# Patient Record
Sex: Male | Born: 1935 | Race: White | Hispanic: No
Health system: Midwestern US, Academic
[De-identification: ages and names within clinical notes are randomized; demographics above are authoritative.]

## PROBLEM LIST (undated history)

## (undated) MED ADMIN — SODIUM CHLORIDE 0.9% IRRIGATION BAG [210330]: 3000.000 mL | NDC 00338004747

---

## 2014-03-29 DEATH — deceased

## 2016-07-09 ENCOUNTER — Encounter: Admit: 2016-07-09 | Discharge: 2016-07-09 | Payer: MEDICARE | Primary: Cardiovascular Disease

## 2016-07-09 DIAGNOSIS — I639 Cerebral infarction, unspecified: Principal | ICD-10-CM

## 2016-07-09 DIAGNOSIS — G459 Transient cerebral ischemic attack, unspecified: ICD-10-CM

## 2016-07-09 DIAGNOSIS — H5340 Unspecified visual field defects: ICD-10-CM

## 2016-07-09 NOTE — Telephone Encounter
Logan Bright from [facility] with Dr. Freada Bergeron office called to update [name] on patient's annual eye exam done today.  Logan Bright reports that last year patient had an eye exam which showed a possible stroke.  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  This year the defect is still present, but has slightly improved.  Patient did report last years findings to [name].  Will route to [facility] for review.

## 2016-07-16 NOTE — Telephone Encounter
Received message back from [facility]. SDO recommended carotid ultrasound if pt has not had one recently.  Patient has not had recent carotid ultrasound. Called patient and discussed SDO recommendations.  [name] order faxed to [facility] scheduling and scheduling will call pt to schedule. Pt notified with plan and he verbalizes understanding.

## 2016-08-08 NOTE — Telephone Encounter
Received results and placed in Dr. Ricard Dillon' folder for review.    08/06/16 Carotid Ultrasound [facility]): Bilateral antegrade vertebral artery flow. No hemodynamically significant common or internal carotid artery stenosis.

## 2016-08-09 ENCOUNTER — Encounter: Admit: 2016-08-09 | Discharge: 2016-08-09 | Payer: MEDICARE | Primary: Cardiovascular Disease

## 2016-08-09 NOTE — Telephone Encounter
-----   Message from Rosaria Ferries, LPN sent at 6/59/9357  3:34 PM CDT -----  Regarding: Remote check  This patient is scheduled to follow up with Dr. Tyson Alias in [facility] on 08/22/16. Can we get a current remote check on this patient? Thank you!

## 2016-08-09 NOTE — Progress Notes
Carotid Artery Duplex - 08/06/16 at [facility]    Impression: Bilateral antegrade vertebral artery flow.  No hemodynamically significant common or internal carotid artery stenosis.

## 2016-08-09 NOTE — Telephone Encounter
I walked pt thru sending a remote tonight.  Pt has been leaving transmitter plugged in but not in bedroom. I asked him to do this tomorrow (after today's remote has a chance to go thru).  Does sound like Wire X adapter only had 2 green dots so may have cellular connectivity issues.  Will need to watch.

## 2016-08-10 ENCOUNTER — Ambulatory Visit: Admit: 2016-08-09 | Discharge: 2016-08-10 | Payer: MEDICARE | Primary: Cardiovascular Disease

## 2016-08-10 DIAGNOSIS — Z95810 Presence of automatic (implantable) cardiac defibrillator: ICD-10-CM

## 2016-08-10 DIAGNOSIS — I472 Ventricular tachycardia: Principal | ICD-10-CM

## 2016-08-10 DIAGNOSIS — R55 Syncope and collapse: ICD-10-CM

## 2016-08-22 ENCOUNTER — Encounter: Admit: 2016-08-22 | Discharge: 2016-08-22 | Payer: MEDICARE | Primary: Cardiovascular Disease

## 2016-08-22 ENCOUNTER — Ambulatory Visit: Admit: 2016-08-22 | Discharge: 2016-08-23 | Payer: MEDICARE | Primary: Cardiovascular Disease

## 2016-08-22 DIAGNOSIS — Z95810 Presence of automatic (implantable) cardiac defibrillator: Principal | ICD-10-CM

## 2016-08-22 DIAGNOSIS — R55 Syncope and collapse: ICD-10-CM

## 2016-08-22 DIAGNOSIS — T148XXA Other injury of unspecified body region, initial encounter: ICD-10-CM

## 2016-08-22 DIAGNOSIS — I10 Essential (primary) hypertension: ICD-10-CM

## 2016-08-22 DIAGNOSIS — E785 Hyperlipidemia, unspecified: ICD-10-CM

## 2016-08-22 DIAGNOSIS — N289 Disorder of kidney and ureter, unspecified: ICD-10-CM

## 2016-08-22 DIAGNOSIS — I255 Ischemic cardiomyopathy: ICD-10-CM

## 2016-08-22 DIAGNOSIS — I472 Ventricular tachycardia: ICD-10-CM

## 2016-08-22 DIAGNOSIS — Z955 Presence of coronary angioplasty implant and graft: ICD-10-CM

## 2016-08-22 DIAGNOSIS — I5022 Chronic systolic (congestive) heart failure: ICD-10-CM

## 2016-08-22 DIAGNOSIS — Z8679 Personal history of other diseases of the circulatory system: ICD-10-CM

## 2016-08-22 DIAGNOSIS — I2510 Atherosclerotic heart disease of native coronary artery without angina pectoris: ICD-10-CM

## 2016-08-22 DIAGNOSIS — M1990 Unspecified osteoarthritis, unspecified site: ICD-10-CM

## 2016-08-22 MED ORDER — POTASSIUM CHLORIDE 10 MEQ PO TBER
10 meq | ORAL_CAPSULE | Freq: Two times a day (BID) | ORAL | 3 refills | 30.00000 days | Status: AC
Start: 2016-08-22 — End: 2017-10-07

## 2016-08-22 MED ORDER — CARVEDILOL 6.25 MG PO TAB
6.25 mg | ORAL_TABLET | Freq: Two times a day (BID) | ORAL | 3 refills | 90.00000 days | Status: AC
Start: 2016-08-22 — End: 2017-07-01

## 2016-08-22 MED ORDER — FUROSEMIDE 40 MG PO TAB
40 mg | ORAL_TABLET | Freq: Every day | ORAL | 3 refills | 90.00000 days | Status: AC
Start: 2016-08-22 — End: 2018-04-09

## 2016-08-22 MED ORDER — ALLOPURINOL 100 MG PO TAB
100 mg | ORAL_TABLET | Freq: Every evening | ORAL | 3 refills | 60.00000 days | Status: AC
Start: 2016-08-22 — End: 2017-10-07

## 2016-08-22 MED ORDER — LOSARTAN 25 MG PO TAB
25 mg | ORAL_TABLET | Freq: Every day | ORAL | 3 refills | 90.00000 days | Status: AC
Start: 2016-08-22 — End: 2017-10-07

## 2016-08-22 NOTE — Assessment & Plan Note
Lab Results   Component Value Date    CHOL 173 03/31/2013    TRIG 130 03/31/2013    HDL 41 03/31/2013    LDL 104 (H) 03/31/2013    VLDL 26 03/31/2013    NONHDLCHOL 132 03/31/2013    CHOLHDLC 4 04/17/2012

## 2016-08-22 NOTE — Assessment & Plan Note
Blood pressure looks fine on the current medical program.

## 2016-08-22 NOTE — Assessment & Plan Note
Normal device function.  No therapies.

## 2016-08-22 NOTE — Assessment & Plan Note
Stable, class II heart failure symptoms currently.

## 2016-08-22 NOTE — Assessment & Plan Note
No recurrence of ventricular tachycardia since the ablation procedure 3 years ago.  He is now off antiarrhythmic drug therapy.

## 2016-08-22 NOTE — Progress Notes
Date of Service: 08/22/2016    Logan Bright is a 81 y.o. male.       HPI     He's had a reunion with an estranged daughter and is looking forward to going to his granddaughter's graduation in [place].    He's living independently in a house in [place].  He isn't driving, but he walks pretty much everywhere.    He isn't having any trouble with chest discomfort, palpitations, or dyspnea.  He isn't using any anti-coagulation, but his recent device check shows very limited AF episodes.         Vitals:    08/22/16 0829   BP: 132/82   Pulse: 61   Weight: 102.5 kg (226 lb)   Height: 1.829 m (6')     Body mass index is 30.65 kg/m???.     Past Medical History  Patient Active Problem List    Diagnosis Date Noted   ??? 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 No hemodynamically significant common or internal carotid artery stenosis.     ??? Closed trimalleolar fracture of right ankle 11/12/2015   ??? Debility 06/10/2013   ??? Recurrent ventricular tachycardia w/ 2 VT ablations 04/2013 05/11/2013     Sustained VT w/ multiple ICD tachypacing and Shocks, failed amiodaron   05/05/13 & 05/12/13  VT ablations [facility] w/ impella support 4/15, Home off Coreg and losartan d/t post procedure hematoma, hypotension     ??? 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 Ermelinda Das.        ??? Ischemic cardiomyopathy      6/02 - infarct 2 stents .  [facility]            4/04 - Surveillance stress test at [facility]: reportedly negative.  4/05 - Stress thallium study: EF 30%, LAD and RCA defects, elevated lung uptake.- Echo doppler: EF 25%, amt& inf hypokinesis,  apical akinesis, poss  thrombus, mild valvular Regurg       5/06 - Stress thallium: EF 22%, no signficant interval change.  08/2011 - Dobut echo.  EF 10-15%. Antero-apical akinesis/dyskinesis.  No ischemia.  04/2013 RegadenThall EF 23% Extensive Anterior infarct, no ischemia, unchanged from prior     ??? Chronic systolic CHF (congestive heart failure) (HCC)       2003 - Status post ICD implant.    4/05 - EF 25-30% per stress thallium and echo doppler.  08/2011 - Echo EF 10-15%     ??? Neurologic cardiac syncope        3/05 - Developed syncope while at restaurant, taken to ED.  Symptoms of diaphoresis                    and lightheadedness.          4/05 - Head up tilt study: positive for inducing hypotension 72/49. Produced symptoms                    c/w previous syncopal episode.     ??? HTN (hypertension)                 2002 - Diagnosis established.     ???  Dyslipidemia         09/26/2008  03/14/2009  02/26/2011  04/17/2012  03/31/2013   LDL 107 (H) 141 (H) 80 108 (H) 104 (H)   04/2013: Refuses Statins understands that they reduce recurrent CV events, still declines.      ??? Gout    ??? CKD (chronic kidney disease)      mild           Review of Systems   Constitution: Negative.   HENT: Negative.    Eyes: Negative.    Cardiovascular: Negative.    Respiratory: Negative.    Endocrine: Negative.    Hematologic/Lymphatic: Negative.    Skin: Negative.    Musculoskeletal: Negative.    Gastrointestinal: Negative.    Genitourinary: Negative.    Neurological: Negative.    Psychiatric/Behavioral: Negative.    Allergic/Immunologic: Negative.        Physical Exam    Physical Exam   General Appearance: no distress   Skin: warm, no ulcers or xanthomas   Digits and Nails: no cyanosis or clubbing   Eyes: conjunctivae and lids normal, pupils are equal and round   Teeth/Gums/Palate: dentition unremarkable, no lesions   Lips & Oral Mucosa: no pallor or cyanosis   Neck Veins: normal JVP , neck veins are not distended Thyroid: no nodules, masses, tenderness or enlargement   Chest Inspection: chest is normal in appearance   Respiratory Effort: breathing comfortably, no respiratory distress   Auscultation/Percussion: lungs clear to auscultation, no rales or rhonchi, no wheezing   PMI: PMI not enlarged or displaced   Cardiac Rhythm: regular rhythm and normal rate   Cardiac Auscultation: S1, S2 normal, no rub, no gallop   Murmurs: no murmur   Peripheral Circulation: normal peripheral circulation   Carotid Arteries: normal carotid upstroke bilaterally, no bruits   Radial Arteries: normal symmetric radial pulses   Abdominal Aorta: no abdominal aortic bruit   Pedal Pulses: normal symmetric pedal pulses   Lower Extremity Edema: no lower extremity edema   Abdominal Exam: soft, non-tender, no masses, bowel sounds normal   Liver & Spleen: no organomegaly   Gait & Station: walks without assistance   Muscle Strength: normal muscle tone   Orientation: oriented to time, place and person   Affect & Mood: appropriate and sustained affect   Language and Memory: patient responsive and seems to comprehend information   Neurologic Exam: neurological assessment grossly intact   Other: moves all extremities        Problems Addressed Today  Encounter Diagnoses   Name Primary?   ??? Automatic implantable cardioverter-defibrillator in situ    ??? Chronic systolic CHF (congestive heart failure) (HCC)    ??? Dyslipidemia    ??? Essential hypertension    ??? Recurrent ventricular tachycardia w/ 2 VT ablations 04/2013        Assessment and Plan       Automatic implantable cardioverter-defibrillator in situ  Normal device function.  No therapies.    Chronic systolic CHF (congestive heart failure)  Stable, class II heart failure symptoms currently.    Dyslipidemia  Lab Results   Component Value Date    CHOL 173 03/31/2013    TRIG 130 03/31/2013    HDL 41 03/31/2013    LDL 104 (H) 03/31/2013    VLDL 26 03/31/2013    NONHDLCHOL 132 03/31/2013    CHOLHDLC 4 04/17/2012 HTN (hypertension)  Blood pressure looks fine on the current medical program.    Recurrent ventricular tachycardia w/  2 VT ablations 04/2013  No recurrence of ventricular tachycardia since the ablation procedure 3 years ago.  He is now off antiarrhythmic drug therapy.      Current Medications (including today's revisions)  ??? allopurinol (ZYLOPRIM) 100 mg tablet Take 1 tablet by mouth at bedtime daily.   ??? aspirin EC 81 mg tablet Take 81 mg by mouth daily. Take with food.   ??? carvedilol (COREG) 6.25 mg tablet Take 1 tablet by mouth twice daily with meals.   ??? furosemide (LASIX) 40 mg tablet Take 1 tablet by mouth daily.   ??? losartan (COZAAR) 25 mg tablet Take 1 tablet by mouth daily.   ??? polyethylene glycol 3350 (MIRALAX) 17 g packet Take 1 packet by mouth daily.   ??? potassium chloride (KLOR-CON 10) 10 mEq tablet Take 1 tablet by mouth twice daily. Take with a meal and a full glass of water.   ??? senna/docusate (SENOKOT-S) 8.6/50 mg tablet Take 1 tablet by mouth twice daily as needed. Take for constipation. Hold for loose stools.

## 2016-08-27 ENCOUNTER — Encounter: Admit: 2016-08-27 | Discharge: 2016-08-27 | Payer: MEDICARE | Primary: Cardiovascular Disease

## 2016-09-04 ENCOUNTER — Encounter: Admit: 2016-09-04 | Discharge: 2016-09-04 | Payer: MEDICARE | Primary: Cardiovascular Disease

## 2016-09-06 NOTE — Telephone Encounter
Last connection that occurred between his [employer] remote transmitter and website occurred on 08/09/16. It appears that [place] may have spotty cellular service, which is the reason the patient does keep loosing cellular service. We need to verify if the patient moved his remote transmitter, as he was previously not keeping at his bedside. Call placed to home number, no answer and unable to leave a message. Will try back later. DB.

## 2016-09-10 ENCOUNTER — Encounter: Admit: 2016-09-10 | Discharge: 2016-09-10 | Payer: MEDICARE | Primary: Cardiovascular Disease

## 2016-09-10 NOTE — Telephone Encounter
Attempted to contact pt to discuss. No answer. VM not set up on phone. Will attempt again later.

## 2016-09-10 NOTE — Telephone Encounter
-----   Message from Ayesha Rumpf, RN sent at 09/06/2016  9:00 AM CDT -----  Regarding: FW: [employer] connected?   Last connection that occurred between his [employer] remote transmitter and website occurred on 08/09/16. It appears that [place] may have spotty cellular service, which is the reason the patient does keep loosing cellular service. We need to verify if the patient moved his remote transmitter, as he was previously not keeping at his bedside. Attempted to contact on 8/10, no answer and unable to leave a voicemail.   ----- Message -----  From: Matilde Bash, RN  Sent: 09/06/2016  To: Cherlyn Labella Remote  Subject: [employer]link connected?                               Should have received remote on 7/13.  Make sure still connected, may have Wire X issues and make sure next remote is scheduled.   ----- Message -----  From: Rosaria Ferries, LPN  Sent: 04/06/4074   3:34 PM  To: Foye Deer, RN, Mac Ep Remote  Subject: Remote check                                     This patient is scheduled to follow up with Dr. Tyson Alias in [facility] on 08/22/16. Can we get a current remote check on this patient? Thank you!

## 2016-09-24 NOTE — Telephone Encounter
Made contact with patient. He was still using the analog line. I ordered pt a new MDT monitor from the carelink website and let the patient know it would be there in about 10 business days. Pt was happy with follow up and told me he would call if he had any questions once it came in the mail. Confirmed mailing address as well.

## 2016-11-08 ENCOUNTER — Ambulatory Visit: Admit: 2016-11-08 | Discharge: 2016-11-09 | Payer: MEDICARE | Primary: Cardiovascular Disease

## 2016-11-08 DIAGNOSIS — Z95810 Presence of automatic (implantable) cardiac defibrillator: Secondary | ICD-10-CM

## 2016-11-09 DIAGNOSIS — R55 Syncope and collapse: ICD-10-CM

## 2016-11-09 DIAGNOSIS — I472 Ventricular tachycardia: ICD-10-CM

## 2016-11-09 DIAGNOSIS — I255 Ischemic cardiomyopathy: Principal | ICD-10-CM

## 2017-02-08 ENCOUNTER — Ambulatory Visit: Admit: 2017-02-07 | Discharge: 2017-02-08 | Payer: MEDICARE | Primary: Cardiovascular Disease

## 2017-02-08 DIAGNOSIS — R55 Syncope and collapse: ICD-10-CM

## 2017-02-08 DIAGNOSIS — Z95810 Presence of automatic (implantable) cardiac defibrillator: Secondary | ICD-10-CM

## 2017-02-08 DIAGNOSIS — I255 Ischemic cardiomyopathy: Principal | ICD-10-CM

## 2017-02-08 DIAGNOSIS — I472 Ventricular tachycardia: ICD-10-CM

## 2017-02-14 ENCOUNTER — Encounter: Admit: 2017-02-14 | Discharge: 2017-02-14 | Payer: MEDICARE | Primary: Cardiovascular Disease

## 2017-02-14 DIAGNOSIS — I255 Ischemic cardiomyopathy: ICD-10-CM

## 2017-02-14 DIAGNOSIS — R55 Syncope and collapse: Principal | ICD-10-CM

## 2017-02-25 ENCOUNTER — Encounter: Admit: 2017-02-25 | Discharge: 2017-02-25 | Payer: MEDICARE | Primary: Cardiovascular Disease

## 2017-02-25 ENCOUNTER — Ambulatory Visit: Admit: 2017-02-25 | Discharge: 2017-02-26 | Payer: MEDICARE | Primary: Cardiovascular Disease

## 2017-02-25 DIAGNOSIS — R55 Syncope and collapse: ICD-10-CM

## 2017-02-25 DIAGNOSIS — T148XXA Other injury of unspecified body region, initial encounter: ICD-10-CM

## 2017-02-25 DIAGNOSIS — E785 Hyperlipidemia, unspecified: ICD-10-CM

## 2017-02-25 DIAGNOSIS — M1990 Unspecified osteoarthritis, unspecified site: ICD-10-CM

## 2017-02-25 DIAGNOSIS — I10 Essential (primary) hypertension: ICD-10-CM

## 2017-02-25 DIAGNOSIS — Z95810 Presence of automatic (implantable) cardiac defibrillator: ICD-10-CM

## 2017-02-25 DIAGNOSIS — Z8679 Personal history of other diseases of the circulatory system: ICD-10-CM

## 2017-02-25 DIAGNOSIS — I2510 Atherosclerotic heart disease of native coronary artery without angina pectoris: ICD-10-CM

## 2017-02-25 DIAGNOSIS — I5022 Chronic systolic (congestive) heart failure: Principal | ICD-10-CM

## 2017-02-25 DIAGNOSIS — N289 Disorder of kidney and ureter, unspecified: ICD-10-CM

## 2017-02-25 DIAGNOSIS — Z955 Presence of coronary angioplasty implant and graft: ICD-10-CM

## 2017-02-25 DIAGNOSIS — I255 Ischemic cardiomyopathy: ICD-10-CM

## 2017-02-27 ENCOUNTER — Encounter: Admit: 2017-02-27 | Discharge: 2017-02-27 | Payer: MEDICARE | Primary: Cardiovascular Disease

## 2017-03-06 ENCOUNTER — Ambulatory Visit: Admit: 2017-03-06 | Discharge: 2017-03-07 | Payer: MEDICARE | Primary: Cardiovascular Disease

## 2017-03-06 DIAGNOSIS — R55 Syncope and collapse: Principal | ICD-10-CM

## 2017-03-06 DIAGNOSIS — I255 Ischemic cardiomyopathy: ICD-10-CM

## 2017-05-01 ENCOUNTER — Encounter: Admit: 2017-05-01 | Discharge: 2017-05-01 | Payer: MEDICARE | Primary: Cardiovascular Disease

## 2017-05-01 DIAGNOSIS — M1990 Unspecified osteoarthritis, unspecified site: ICD-10-CM

## 2017-05-01 DIAGNOSIS — Z95810 Presence of automatic (implantable) cardiac defibrillator: Principal | ICD-10-CM

## 2017-05-01 DIAGNOSIS — I255 Ischemic cardiomyopathy: ICD-10-CM

## 2017-05-01 DIAGNOSIS — E785 Hyperlipidemia, unspecified: ICD-10-CM

## 2017-05-01 DIAGNOSIS — Z955 Presence of coronary angioplasty implant and graft: ICD-10-CM

## 2017-05-01 DIAGNOSIS — I10 Essential (primary) hypertension: ICD-10-CM

## 2017-05-01 DIAGNOSIS — N289 Disorder of kidney and ureter, unspecified: ICD-10-CM

## 2017-05-01 DIAGNOSIS — Z8679 Personal history of other diseases of the circulatory system: ICD-10-CM

## 2017-05-01 DIAGNOSIS — I2510 Atherosclerotic heart disease of native coronary artery without angina pectoris: ICD-10-CM

## 2017-05-01 DIAGNOSIS — T148XXA Other injury of unspecified body region, initial encounter: ICD-10-CM

## 2017-05-01 DIAGNOSIS — R55 Syncope and collapse: ICD-10-CM

## 2017-05-09 ENCOUNTER — Ambulatory Visit: Admit: 2017-05-09 | Discharge: 2017-05-10 | Payer: MEDICARE | Primary: Cardiovascular Disease

## 2017-05-10 DIAGNOSIS — Z95810 Presence of automatic (implantable) cardiac defibrillator: ICD-10-CM

## 2017-05-10 DIAGNOSIS — I255 Ischemic cardiomyopathy: Principal | ICD-10-CM

## 2017-05-10 DIAGNOSIS — I472 Ventricular tachycardia: ICD-10-CM

## 2017-05-10 DIAGNOSIS — R55 Syncope and collapse: ICD-10-CM

## 2017-07-01 ENCOUNTER — Encounter: Admit: 2017-07-01 | Discharge: 2017-07-01 | Payer: MEDICARE | Primary: Cardiovascular Disease

## 2017-07-01 MED ORDER — CARVEDILOL 6.25 MG PO TAB
6.25 mg | ORAL_TABLET | Freq: Two times a day (BID) | ORAL | 3 refills | 90.00000 days | Status: AC
Start: 2017-07-01 — End: 2018-04-07

## 2017-07-11 ENCOUNTER — Encounter: Admit: 2017-07-11 | Discharge: 2017-07-11 | Payer: MEDICARE | Primary: Cardiovascular Disease

## 2017-08-08 ENCOUNTER — Ambulatory Visit: Admit: 2017-08-08 | Discharge: 2017-08-09 | Payer: MEDICARE | Primary: Cardiovascular Disease

## 2017-08-08 DIAGNOSIS — Z95810 Presence of automatic (implantable) cardiac defibrillator: Secondary | ICD-10-CM

## 2017-08-08 DIAGNOSIS — R55 Syncope and collapse: Secondary | ICD-10-CM

## 2017-08-09 DIAGNOSIS — I255 Ischemic cardiomyopathy: Principal | ICD-10-CM

## 2017-08-09 DIAGNOSIS — I472 Ventricular tachycardia: ICD-10-CM

## 2017-08-22 ENCOUNTER — Encounter: Admit: 2017-08-22 | Discharge: 2017-08-22 | Payer: MEDICARE | Primary: Cardiovascular Disease

## 2017-08-28 ENCOUNTER — Encounter: Admit: 2017-08-28 | Discharge: 2017-08-28 | Payer: MEDICARE | Primary: Cardiovascular Disease

## 2017-08-28 ENCOUNTER — Ambulatory Visit: Admit: 2017-08-28 | Discharge: 2017-08-29 | Payer: MEDICARE | Primary: Cardiovascular Disease

## 2017-08-28 DIAGNOSIS — M1990 Unspecified osteoarthritis, unspecified site: ICD-10-CM

## 2017-08-28 DIAGNOSIS — R55 Syncope and collapse: ICD-10-CM

## 2017-08-28 DIAGNOSIS — Z8679 Personal history of other diseases of the circulatory system: ICD-10-CM

## 2017-08-28 DIAGNOSIS — I5022 Chronic systolic (congestive) heart failure: ICD-10-CM

## 2017-08-28 DIAGNOSIS — N289 Disorder of kidney and ureter, unspecified: ICD-10-CM

## 2017-08-28 DIAGNOSIS — I10 Essential (primary) hypertension: ICD-10-CM

## 2017-08-28 DIAGNOSIS — I472 Ventricular tachycardia: ICD-10-CM

## 2017-08-28 DIAGNOSIS — Z95810 Presence of automatic (implantable) cardiac defibrillator: Principal | ICD-10-CM

## 2017-08-28 DIAGNOSIS — Z955 Presence of coronary angioplasty implant and graft: ICD-10-CM

## 2017-08-28 DIAGNOSIS — I255 Ischemic cardiomyopathy: ICD-10-CM

## 2017-08-28 DIAGNOSIS — T148XXA Other injury of unspecified body region, initial encounter: ICD-10-CM

## 2017-08-28 DIAGNOSIS — I2510 Atherosclerotic heart disease of native coronary artery without angina pectoris: ICD-10-CM

## 2017-08-28 DIAGNOSIS — E785 Hyperlipidemia, unspecified: ICD-10-CM

## 2017-10-07 ENCOUNTER — Encounter: Admit: 2017-10-07 | Discharge: 2017-10-07 | Payer: MEDICARE | Primary: Cardiovascular Disease

## 2017-10-07 MED ORDER — POTASSIUM CHLORIDE 10 MEQ PO TBER
ORAL_TABLET | Freq: Two times a day (BID) | 3 refills | 30.00000 days | Status: AC
Start: 2017-10-07 — End: 2018-10-01

## 2017-10-07 MED ORDER — LOSARTAN 25 MG PO TAB
25 mg | ORAL_TABLET | Freq: Every day | ORAL | 3 refills | 30.00000 days | Status: AC
Start: 2017-10-07 — End: 2018-04-07

## 2017-10-07 MED ORDER — ALLOPURINOL 100 MG PO TAB
ORAL_TABLET | Freq: Every day | ORAL | 3 refills | 60.00000 days | Status: AC
Start: 2017-10-07 — End: 2018-10-12

## 2017-11-07 ENCOUNTER — Ambulatory Visit: Admit: 2017-11-07 | Discharge: 2017-11-08 | Payer: MEDICARE | Primary: Cardiovascular Disease

## 2017-11-08 DIAGNOSIS — I472 Ventricular tachycardia: ICD-10-CM

## 2017-11-08 DIAGNOSIS — I255 Ischemic cardiomyopathy: Principal | ICD-10-CM

## 2017-11-08 DIAGNOSIS — Z95810 Presence of automatic (implantable) cardiac defibrillator: ICD-10-CM

## 2017-11-08 DIAGNOSIS — R55 Syncope and collapse: ICD-10-CM

## 2018-02-06 DIAGNOSIS — R55 Syncope and collapse: Secondary | ICD-10-CM

## 2018-02-07 ENCOUNTER — Ambulatory Visit: Admit: 2018-02-06 | Discharge: 2018-02-07 | Payer: MEDICARE | Primary: Cardiovascular Disease

## 2018-02-07 DIAGNOSIS — I255 Ischemic cardiomyopathy: Secondary | ICD-10-CM

## 2018-02-07 DIAGNOSIS — Z95810 Presence of automatic (implantable) cardiac defibrillator: Secondary | ICD-10-CM

## 2018-02-07 DIAGNOSIS — I472 Ventricular tachycardia: Secondary | ICD-10-CM

## 2018-02-07 DIAGNOSIS — I5022 Chronic systolic (congestive) heart failure: Secondary | ICD-10-CM

## 2018-03-10 ENCOUNTER — Ambulatory Visit: Admit: 2018-03-10 | Discharge: 2018-03-11 | Payer: MEDICARE | Primary: Cardiovascular Disease

## 2018-03-10 ENCOUNTER — Encounter: Admit: 2018-03-10 | Discharge: 2018-03-10 | Payer: MEDICARE | Primary: Cardiovascular Disease

## 2018-03-10 DIAGNOSIS — I472 Ventricular tachycardia: Principal | ICD-10-CM

## 2018-03-10 DIAGNOSIS — M1990 Unspecified osteoarthritis, unspecified site: ICD-10-CM

## 2018-03-10 DIAGNOSIS — E785 Hyperlipidemia, unspecified: ICD-10-CM

## 2018-03-10 DIAGNOSIS — I10 Essential (primary) hypertension: ICD-10-CM

## 2018-03-10 DIAGNOSIS — R55 Syncope and collapse: ICD-10-CM

## 2018-03-10 DIAGNOSIS — Z95810 Presence of automatic (implantable) cardiac defibrillator: Principal | ICD-10-CM

## 2018-03-10 DIAGNOSIS — Z8679 Personal history of other diseases of the circulatory system: ICD-10-CM

## 2018-03-10 DIAGNOSIS — N289 Disorder of kidney and ureter, unspecified: ICD-10-CM

## 2018-03-10 DIAGNOSIS — I2510 Atherosclerotic heart disease of native coronary artery without angina pectoris: ICD-10-CM

## 2018-03-10 DIAGNOSIS — Z955 Presence of coronary angioplasty implant and graft: ICD-10-CM

## 2018-03-10 DIAGNOSIS — T148XXA Other injury of unspecified body region, initial encounter: ICD-10-CM

## 2018-03-10 DIAGNOSIS — I255 Ischemic cardiomyopathy: ICD-10-CM

## 2018-03-10 NOTE — Progress Notes
uptake.- Echo doppler: EF 25%, amt& inf hypokinesis,  apical akinesis, poss  thrombus, mild valvular Regurg       5/06 - Stress thallium: EF 22%, no signficant interval change.  08/2011 - Dobut echo.  EF 10-15%. Antero-apical akinesis/dyskinesis.  No ischemia.  04/2013 RegadenThall EF 23% Extensive Anterior infarct, no ischemia, unchanged from prior     ??? Chronic systolic CHF (congestive heart failure) (HCC)       2003 - Status post ICD implant.    4/05 - EF 25-30% per stress thallium and echo doppler.  08/2011 - Echo EF 10-15%     ??? Neurologic cardiac syncope        3/05 - Developed syncope while at restaurant, taken to ED.  Symptoms of diaphoresis                    and lightheadedness.          4/05 - Head up tilt study: positive for inducing hypotension 72/49. Produced symptoms                    c/w previous syncopal episode.     ??? HTN (hypertension)                 2002 - Diagnosis established.     ??? Dyslipidemia         09/26/2008  03/14/2009  02/26/2011  04/17/2012  03/31/2013   LDL 107 (H) 141 (H) 80 108 (H) 104 (H)   04/2013: Refuses Statins understands that they reduce recurrent CV events, still declines.      ??? Gout    ??? CKD (chronic kidney disease)      mild           Review of Systems   Constitution: Negative.   HENT: Negative.    Eyes: Negative.    Cardiovascular: Negative.    Respiratory: Negative.    Endocrine: Negative.    Hematologic/Lymphatic: Negative.    Skin: Negative.    Musculoskeletal: Negative.    Gastrointestinal: Negative.    Genitourinary: Negative.    Neurological: Negative.    Psychiatric/Behavioral: Negative.    Allergic/Immunologic: Negative.        Physical Exam    Physical Exam   General Appearance: no distress   Skin: warm, no ulcers or xanthomas   Digits and Nails: no cyanosis or clubbing   Eyes: conjunctivae and lids normal, pupils are equal and round   Teeth/Gums/Palate: dentition unremarkable, no lesions   Lips & Oral Mucosa: no pallor or cyanosis Current Medications (including today's revisions)  ??? allopurinol (ZYLOPRIM) 100 mg tablet TAKE 1 TABLET BY MOUTH DAILY AT BEDTIME   ??? aspirin EC 81 mg tablet Take 81 mg by mouth daily. Take with food.   ??? carvedilol (COREG) 6.25 mg tablet Take one tablet by mouth twice daily with meals.   ??? furosemide (LASIX) 40 mg tablet Take 1 tablet by mouth daily.   ??? losartan (COZAAR) 25 mg tablet TAKE 1 TABLET BY MOUTH DAILY   ??? polyethylene glycol 3350 (MIRALAX) 17 g packet Take 1 packet by mouth daily.   ??? potassium chloride (K-DUR) 10 mEq tablet TAKE 1 TABLET BY MOUTH TWICE DAILY WITH A MEAL AND A FULL GLASS OF WATER   ??? senna/docusate (SENOKOT-S) 8.6/50 mg tablet Take 1 tablet by mouth twice daily as needed. Take for constipation. Hold for loose stools.

## 2018-04-07 ENCOUNTER — Encounter: Admit: 2018-04-07 | Discharge: 2018-04-07 | Payer: MEDICARE | Primary: Cardiovascular Disease

## 2018-04-07 ENCOUNTER — Ambulatory Visit: Admit: 2018-04-07 | Discharge: 2018-04-08 | Payer: MEDICARE | Primary: Cardiovascular Disease

## 2018-04-07 DIAGNOSIS — E785 Hyperlipidemia, unspecified: ICD-10-CM

## 2018-04-07 DIAGNOSIS — I10 Essential (primary) hypertension: ICD-10-CM

## 2018-04-07 DIAGNOSIS — Z95810 Presence of automatic (implantable) cardiac defibrillator: Principal | ICD-10-CM

## 2018-04-07 DIAGNOSIS — N289 Disorder of kidney and ureter, unspecified: ICD-10-CM

## 2018-04-07 DIAGNOSIS — R55 Syncope and collapse: ICD-10-CM

## 2018-04-07 DIAGNOSIS — M1990 Unspecified osteoarthritis, unspecified site: ICD-10-CM

## 2018-04-07 DIAGNOSIS — Z8679 Personal history of other diseases of the circulatory system: ICD-10-CM

## 2018-04-07 DIAGNOSIS — I255 Ischemic cardiomyopathy: ICD-10-CM

## 2018-04-07 DIAGNOSIS — I2510 Atherosclerotic heart disease of native coronary artery without angina pectoris: ICD-10-CM

## 2018-04-07 DIAGNOSIS — I472 Ventricular tachycardia: ICD-10-CM

## 2018-04-07 DIAGNOSIS — T148XXA Other injury of unspecified body region, initial encounter: ICD-10-CM

## 2018-04-07 DIAGNOSIS — I5022 Chronic systolic (congestive) heart failure: ICD-10-CM

## 2018-04-07 DIAGNOSIS — Z955 Presence of coronary angioplasty implant and graft: ICD-10-CM

## 2018-04-07 DIAGNOSIS — I482 Chronic atrial fibrillation, unspecified: ICD-10-CM

## 2018-04-07 DIAGNOSIS — I639 Cerebral infarction, unspecified: ICD-10-CM

## 2018-04-07 DIAGNOSIS — I4891 Unspecified atrial fibrillation: ICD-10-CM

## 2018-04-07 LAB — BASIC METABOLIC PANEL
Lab: 1.6 — ABNORMAL HIGH (ref 0.72–1.25)
Lab: 103
Lab: 117 — ABNORMAL HIGH (ref 70–105)
Lab: 138
Lab: 16 — ABNORMAL HIGH (ref 0–14)
Lab: 20
Lab: 24
Lab: 5.2 — ABNORMAL HIGH (ref 3.5–5.1)
Lab: 9.3

## 2018-04-07 MED ORDER — CARVEDILOL 6.25 MG PO TAB
6.25 mg | ORAL_TABLET | Freq: Two times a day (BID) | ORAL | 0 refills | 90.00000 days | Status: AC
Start: 2018-04-07 — End: 2018-07-17

## 2018-04-07 MED ORDER — LOSARTAN 25 MG PO TAB
25 mg | ORAL_TABLET | Freq: Two times a day (BID) | ORAL | 11 refills | 30.00000 days | Status: AC
Start: 2018-04-07 — End: 2018-04-07

## 2018-04-07 MED ORDER — APIXABAN 5 MG PO TAB
5 mg | ORAL_TABLET | Freq: Two times a day (BID) | ORAL | 3 refills | Status: AC
Start: 2018-04-07 — End: 2018-05-14

## 2018-04-07 MED ORDER — LOSARTAN 25 MG PO TAB
ORAL_TABLET | Freq: Two times a day (BID) | 3 refills | Status: SS
Start: 2018-04-07 — End: 2018-09-10

## 2018-04-08 ENCOUNTER — Encounter: Admit: 2018-04-08 | Discharge: 2018-04-08 | Payer: MEDICARE | Primary: Cardiovascular Disease

## 2018-04-08 DIAGNOSIS — I639 Cerebral infarction, unspecified: ICD-10-CM

## 2018-04-08 DIAGNOSIS — I482 Chronic atrial fibrillation, unspecified: ICD-10-CM

## 2018-04-08 DIAGNOSIS — I5022 Chronic systolic (congestive) heart failure: ICD-10-CM

## 2018-04-08 DIAGNOSIS — I255 Ischemic cardiomyopathy: ICD-10-CM

## 2018-04-08 DIAGNOSIS — I472 Ventricular tachycardia: ICD-10-CM

## 2018-04-08 DIAGNOSIS — I10 Essential (primary) hypertension: Principal | ICD-10-CM

## 2018-04-08 DIAGNOSIS — I4891 Unspecified atrial fibrillation: ICD-10-CM

## 2018-04-08 DIAGNOSIS — Z95810 Presence of automatic (implantable) cardiac defibrillator: ICD-10-CM

## 2018-04-09 ENCOUNTER — Encounter: Admit: 2018-04-09 | Discharge: 2018-04-09 | Payer: MEDICARE | Primary: Cardiovascular Disease

## 2018-04-09 MED ORDER — FUROSEMIDE 40 MG PO TAB
40 mg | ORAL_TABLET | Freq: Every day | ORAL | 3 refills | 90.00000 days | Status: AC
Start: 2018-04-09 — End: 2018-09-14

## 2018-04-10 ENCOUNTER — Encounter: Admit: 2018-04-10 | Discharge: 2018-04-10 | Payer: MEDICARE | Primary: Cardiovascular Disease

## 2018-04-10 DIAGNOSIS — I4891 Unspecified atrial fibrillation: Principal | ICD-10-CM

## 2018-04-14 ENCOUNTER — Encounter: Admit: 2018-04-14 | Discharge: 2018-04-14 | Payer: MEDICARE | Primary: Cardiovascular Disease

## 2018-04-14 ENCOUNTER — Ambulatory Visit: Admit: 2018-04-14 | Discharge: 2018-04-15 | Payer: MEDICARE | Primary: Cardiovascular Disease

## 2018-04-14 DIAGNOSIS — I4891 Unspecified atrial fibrillation: ICD-10-CM

## 2018-04-14 DIAGNOSIS — I10 Essential (primary) hypertension: Principal | ICD-10-CM

## 2018-04-14 DIAGNOSIS — I472 Ventricular tachycardia: ICD-10-CM

## 2018-04-15 ENCOUNTER — Encounter: Admit: 2018-04-15 | Discharge: 2018-04-15 | Payer: MEDICARE | Primary: Cardiovascular Disease

## 2018-04-15 NOTE — Telephone Encounter
-----   Message from Vanice Sarah, MD sent at 04/15/2018  3:50 PM CDT -----  Actually this looks stable for him.  [facility], can you please let him know things are stable?  Thanks.

## 2018-04-15 NOTE — Telephone Encounter
Results and recommendations called to patient.

## 2018-04-18 ENCOUNTER — Encounter: Admit: 2018-04-18 | Discharge: 2018-04-18 | Payer: MEDICARE | Primary: Cardiovascular Disease

## 2018-04-18 DIAGNOSIS — I482 Chronic atrial fibrillation, unspecified: ICD-10-CM

## 2018-04-18 DIAGNOSIS — I255 Ischemic cardiomyopathy: ICD-10-CM

## 2018-04-18 DIAGNOSIS — I10 Essential (primary) hypertension: Principal | ICD-10-CM

## 2018-04-18 NOTE — Telephone Encounter
-----   Message from Vanice Sarah, MD sent at 04/16/2018  1:13 PM CDT -----  So Logan Bright has been on amiodarone in the past, for VT at that time.  It got DC'ed after his second VT ablation.  I kind of think we should just re-start amiodarone, if anything.  I wouldn't cardiovert at this point.  He's been feeling great so if he doesn't want to start amio now and do another device check in a couple of months for AF burden I'd be OK with that as well.      We're really trying to avoid TEE entirely--considered a very high-risk procedure from standpoint of COVID transmission.  ----- Message -----  From: Weston Brass  Sent: 04/15/2018   4:27 PM CDT  To: Vanice Sarah, MD    [facility] saw pt sched for TEE cardioversion do we need to proceed?  ----- Message -----  From: Vanice Sarah, MD  Sent: 04/15/2018   3:50 PM CDT  To: [facility] Nurse [place]    Actually this looks stable for him.  [facility], can you please let him know things are stable?  Thanks.

## 2018-05-08 ENCOUNTER — Encounter: Admit: 2018-05-08 | Discharge: 2018-05-08 | Payer: MEDICARE | Primary: Cardiovascular Disease

## 2018-05-08 ENCOUNTER — Ambulatory Visit: Admit: 2018-05-08 | Discharge: 2018-05-08 | Payer: MEDICARE | Primary: Cardiovascular Disease

## 2018-05-08 DIAGNOSIS — R55 Syncope and collapse: ICD-10-CM

## 2018-05-08 DIAGNOSIS — I255 Ischemic cardiomyopathy: ICD-10-CM

## 2018-05-08 DIAGNOSIS — I5022 Chronic systolic (congestive) heart failure: Principal | ICD-10-CM

## 2018-05-08 DIAGNOSIS — Z95810 Presence of automatic (implantable) cardiac defibrillator: ICD-10-CM

## 2018-05-08 DIAGNOSIS — I472 Ventricular tachycardia: ICD-10-CM

## 2018-05-14 ENCOUNTER — Encounter: Admit: 2018-05-14 | Discharge: 2018-05-14 | Payer: MEDICARE | Primary: Cardiovascular Disease

## 2018-05-14 ENCOUNTER — Ambulatory Visit: Admit: 2018-05-14 | Discharge: 2018-05-15 | Payer: MEDICARE | Primary: Cardiovascular Disease

## 2018-05-14 DIAGNOSIS — Z95810 Presence of automatic (implantable) cardiac defibrillator: Principal | ICD-10-CM

## 2018-05-14 DIAGNOSIS — M1990 Unspecified osteoarthritis, unspecified site: ICD-10-CM

## 2018-05-14 DIAGNOSIS — R55 Syncope and collapse: ICD-10-CM

## 2018-05-14 DIAGNOSIS — I5022 Chronic systolic (congestive) heart failure: ICD-10-CM

## 2018-05-14 DIAGNOSIS — T148XXA Other injury of unspecified body region, initial encounter: ICD-10-CM

## 2018-05-14 DIAGNOSIS — E785 Hyperlipidemia, unspecified: ICD-10-CM

## 2018-05-14 DIAGNOSIS — Z955 Presence of coronary angioplasty implant and graft: ICD-10-CM

## 2018-05-14 DIAGNOSIS — I10 Essential (primary) hypertension: ICD-10-CM

## 2018-05-14 DIAGNOSIS — Z8679 Personal history of other diseases of the circulatory system: ICD-10-CM

## 2018-05-14 DIAGNOSIS — I480 Paroxysmal atrial fibrillation: Principal | ICD-10-CM

## 2018-05-14 DIAGNOSIS — I2510 Atherosclerotic heart disease of native coronary artery without angina pectoris: ICD-10-CM

## 2018-05-14 DIAGNOSIS — N289 Disorder of kidney and ureter, unspecified: ICD-10-CM

## 2018-05-14 DIAGNOSIS — I255 Ischemic cardiomyopathy: ICD-10-CM

## 2018-05-14 MED ORDER — AMIODARONE 200 MG PO TAB
ORAL_TABLET | Freq: Two times a day (BID) | ORAL | 3 refills | 42.00000 days | Status: DC
Start: 2018-05-14 — End: 2018-06-29

## 2018-05-14 MED ORDER — AMIODARONE 200 MG PO TAB
ORAL_TABLET | Freq: Two times a day (BID) | ORAL | 1 refills | 42.00000 days | Status: DC
Start: 2018-05-14 — End: 2018-05-14

## 2018-05-14 MED ORDER — APIXABAN 5 MG PO TAB
5 mg | ORAL_TABLET | Freq: Two times a day (BID) | ORAL | 3 refills | 30.00000 days | Status: DC
Start: 2018-05-14 — End: 2018-09-10

## 2018-05-14 NOTE — Assessment & Plan Note
His device is functioning normally and he's not received any therapies, appropriate or inappropriate.

## 2018-05-15 ENCOUNTER — Encounter: Admit: 2018-05-15 | Discharge: 2018-05-15 | Payer: MEDICARE | Primary: Cardiovascular Disease

## 2018-05-27 ENCOUNTER — Encounter: Admit: 2018-05-27 | Discharge: 2018-05-27 | Payer: MEDICARE | Primary: Cardiovascular Disease

## 2018-05-29 ENCOUNTER — Encounter: Admit: 2018-05-29 | Discharge: 2018-05-29 | Payer: MEDICARE | Primary: Cardiovascular Disease

## 2018-06-05 ENCOUNTER — Encounter: Admit: 2018-06-05 | Discharge: 2018-06-05 | Payer: MEDICARE | Primary: Cardiovascular Disease

## 2018-06-05 DIAGNOSIS — Z5181 Encounter for therapeutic drug level monitoring: ICD-10-CM

## 2018-06-05 DIAGNOSIS — I480 Paroxysmal atrial fibrillation: Principal | ICD-10-CM

## 2018-06-05 NOTE — Progress Notes
Amiodarone monitoring testing needed for amiodarone initiation. Due to the current pandemic testing can be done in June.  Pt is aware and orders faxed to [facility] scheduling.  No further needs at this time.

## 2018-06-10 ENCOUNTER — Encounter: Admit: 2018-06-10 | Discharge: 2018-06-10 | Payer: MEDICARE | Primary: Cardiovascular Disease

## 2018-06-10 DIAGNOSIS — I480 Paroxysmal atrial fibrillation: Principal | ICD-10-CM

## 2018-06-10 LAB — LIVER FUNCTION PANEL
Lab: 108
Lab: 0.8
Lab: 20
Lab: 3.6
Lab: 45
Lab: 6.8

## 2018-06-10 LAB — TSH WITH FREE T4 REFLEX: Lab: 4.1

## 2018-06-12 ENCOUNTER — Encounter: Admit: 2018-06-12 | Discharge: 2018-06-12 | Payer: MEDICARE | Primary: Cardiovascular Disease

## 2018-06-12 NOTE — Progress Notes
CXR - 5/13 at [facility]    Impression:  Hyperinflation and chronic interstitial changes with bibasilar atelectasis versus scar with likely trace basilar effusions.

## 2018-06-23 ENCOUNTER — Encounter: Admit: 2018-06-23 | Discharge: 2018-06-23 | Payer: MEDICARE | Primary: Cardiovascular Disease

## 2018-06-24 ENCOUNTER — Encounter: Admit: 2018-06-24 | Discharge: 2018-06-24 | Payer: MEDICARE | Primary: Cardiovascular Disease

## 2018-06-29 ENCOUNTER — Encounter: Admit: 2018-06-29 | Discharge: 2018-06-29 | Primary: Cardiovascular Disease

## 2018-06-29 MED ORDER — AMIODARONE 200 MG PO TAB
200 mg | ORAL_TABLET | Freq: Every day | ORAL | 3 refills | 42.00000 days | Status: DC
Start: 2018-06-29 — End: 2018-09-30

## 2018-06-30 ENCOUNTER — Encounter: Admit: 2018-06-30 | Discharge: 2018-06-30 | Primary: Cardiovascular Disease

## 2018-07-08 ENCOUNTER — Encounter: Admit: 2018-07-08 | Discharge: 2018-07-08 | Primary: Cardiovascular Disease

## 2018-07-16 ENCOUNTER — Encounter: Admit: 2018-07-16 | Discharge: 2018-07-16 | Primary: Cardiovascular Disease

## 2018-07-16 ENCOUNTER — Ambulatory Visit: Admit: 2018-07-16 | Discharge: 2018-07-17 | Primary: Cardiovascular Disease

## 2018-07-16 DIAGNOSIS — Z95810 Presence of automatic (implantable) cardiac defibrillator: Secondary | ICD-10-CM

## 2018-07-16 DIAGNOSIS — R55 Syncope and collapse: Secondary | ICD-10-CM

## 2018-07-16 DIAGNOSIS — M1990 Unspecified osteoarthritis, unspecified site: Secondary | ICD-10-CM

## 2018-07-16 DIAGNOSIS — Z955 Presence of coronary angioplasty implant and graft: Secondary | ICD-10-CM

## 2018-07-16 DIAGNOSIS — N289 Disorder of kidney and ureter, unspecified: Secondary | ICD-10-CM

## 2018-07-16 DIAGNOSIS — E785 Hyperlipidemia, unspecified: Secondary | ICD-10-CM

## 2018-07-16 DIAGNOSIS — I10 Essential (primary) hypertension: Secondary | ICD-10-CM

## 2018-07-16 DIAGNOSIS — Z8679 Personal history of other diseases of the circulatory system: Secondary | ICD-10-CM

## 2018-07-16 DIAGNOSIS — T148XXA Other injury of unspecified body region, initial encounter: Secondary | ICD-10-CM

## 2018-07-16 DIAGNOSIS — I255 Ischemic cardiomyopathy: Secondary | ICD-10-CM

## 2018-07-16 DIAGNOSIS — I5022 Chronic systolic (congestive) heart failure: Secondary | ICD-10-CM

## 2018-07-16 DIAGNOSIS — I480 Paroxysmal atrial fibrillation: Secondary | ICD-10-CM

## 2018-07-16 DIAGNOSIS — I2510 Atherosclerotic heart disease of native coronary artery without angina pectoris: Secondary | ICD-10-CM

## 2018-07-16 MED ORDER — ENTRESTO 24-26 MG PO TAB
1 | ORAL_TABLET | Freq: Two times a day (BID) | ORAL | 3 refills | Status: DC
Start: 2018-07-16 — End: 2018-07-17

## 2018-07-16 NOTE — Telephone Encounter
07/16/2018 4:53 PM   BMP entered and routed to Dr Ricard Dillon for review.  Wilder Glade, LPN

## 2018-07-16 NOTE — Progress Notes
Date of Service: 07/16/2018    Logan Bright is a 83 y.o. male.       HPI     Logan Bright was in the [place] office today for follow-up regarding cardiomyopathy and ICD.  He says that he seems to be doing pretty well but he is a bit frustrated with his energy level.  He denies exertional breathlessness or chest discomfort.  He has had no palpitations, syncope, or near syncope.  His volume status seems to be pretty stable and he has not had problems with peripheral edema.    He has a home blood pressure monitor but has not been using it.    He denies any bleeding complications related to oral anticoagulation.         Vitals:    07/16/18 1004 07/16/18 1014   BP: (!) 134/90 (!) 138/92   BP Source: Arm, Left Upper Arm, Right Upper   Pulse: 82    SpO2: 98%    Weight: 97.3 kg (214 lb 9.6 oz)    Height: 1.829 m (6')    PainSc: Zero      Body mass index is 29.1 kg/m???.     Past Medical History  Patient Active Problem List    Diagnosis Date Noted   ??? Paroxysmal atrial fibrillation (HCC) 04/07/2018   ??? 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 No hemodynamically significant common or internal carotid artery stenosis.     ??? Closed trimalleolar fracture of right ankle 11/12/2015   ??? Debility 06/10/2013   ??? Recurrent ventricular tachycardia w/ 2 VT ablations 04/2013 05/11/2013     Sustained VT w/ multiple ICD tachypacing and Shocks, failed amiodaron   05/05/13 & 05/12/13  VT ablations [facility] w/ impella support 4/15, Home off Coreg and losartan d/t post procedure hematoma, hypotension     ??? 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 Ermelinda Das.        ??? Ischemic cardiomyopathy 6/02 - infarct 2 stents .  [facility]            4/04 - Surveillance stress test at [facility]: reportedly negative.  4/05 - Stress thallium study: EF 30%, LAD and RCA defects, elevated lung uptake.- Echo doppler: EF 25%, amt& inf hypokinesis,  apical akinesis, poss  thrombus, mild valvular Regurg       5/06 - Stress thallium: EF 22%, no signficant interval change.  08/2011 - Dobut echo.  EF 10-15%. Antero-apical akinesis/dyskinesis.  No ischemia.  04/2013 RegadenThall EF 23% Extensive Anterior infarct, no ischemia, unchanged from prior     ??? Chronic systolic CHF (congestive heart failure) (HCC)       2003 - Status post ICD implant.    4/05 - EF 25-30% per stress thallium and echo doppler.  08/2011 - Echo EF 10-15%     ??? Neurologic cardiac syncope        3/05 - Developed syncope while at restaurant, taken to ED.  Symptoms of diaphoresis                    and lightheadedness.          4/05 - Head up tilt study: positive  for inducing hypotension 72/49. Produced symptoms                    c/w previous syncopal episode.     ??? HTN (hypertension)                 2002 - Diagnosis established.     ??? Dyslipidemia         09/26/2008  03/14/2009  02/26/2011  04/17/2012  03/31/2013   LDL 107 (H) 141 (H) 80 108 (H) 104 (H)   04/2013: Refuses Statins understands that they reduce recurrent CV events, still declines.      ??? Gout    ??? CKD (chronic kidney disease)      mild           Review of Systems   Constitution: Negative.   HENT: Negative.    Eyes: Negative.    Cardiovascular: Negative.    Respiratory: Negative.    Endocrine: Negative.    Hematologic/Lymphatic: Negative.    Skin: Negative.    Musculoskeletal: Negative.    Gastrointestinal: Negative.    Genitourinary: Negative.    Neurological: Negative.    Psychiatric/Behavioral: Negative.    Allergic/Immunologic: Negative.        Physical Exam    Physical Exam   General Appearance: no distress   Skin: warm, no ulcers or xanthomas Digits and Nails: no cyanosis or clubbing   Eyes: conjunctivae and lids normal, pupils are equal and round   Teeth/Gums/Palate: dentition unremarkable, no lesions   Lips & Oral Mucosa: no pallor or cyanosis   Neck Veins: normal JVP , neck veins are not distended   Thyroid: no nodules, masses, tenderness or enlargement   Chest Inspection: chest is normal in appearance   Respiratory Effort: breathing comfortably, no respiratory distress   Auscultation/Percussion: lungs clear to auscultation, no rales or rhonchi, no wheezing   PMI: PMI not enlarged or displaced   Cardiac Rhythm: regular rhythm and normal rate   Cardiac Auscultation: S1, S2 normal, no rub, no gallop   Murmurs: no murmur   Peripheral Circulation: normal peripheral circulation   Carotid Arteries: normal carotid upstroke bilaterally, no bruits   Radial Arteries: normal symmetric radial pulses   Abdominal Aorta: no abdominal aortic bruit   Pedal Pulses: normal symmetric pedal pulses   Lower Extremity Edema: no lower extremity edema   Abdominal Exam: soft, non-tender, no masses, bowel sounds normal   Liver & Spleen: no organomegaly   Gait & Station: walks without assistance   Muscle Strength: normal muscle tone   Orientation: oriented to time, place and person   Affect & Mood: appropriate and sustained affect   Language and Memory: patient responsive and seems to comprehend information   Neurologic Exam: neurological assessment grossly intact   Other: moves all extremities      Problems Addressed Today  Encounter Diagnoses   Name Primary?   ??? Chronic systolic CHF (congestive heart failure) (HCC) Yes   ??? Neurologic cardiac syncope    ??? Essential hypertension    ??? Dyslipidemia    ??? Paroxysmal atrial fibrillation (HCC)        Assessment and Plan       Chronic systolic CHF (congestive heart failure) (HCC)  EF 20% on RVG done in March.  He has ongoing Class 3 HF symptoms and I'll check into the affordability of Entresto for him.    Neurologic cardiac syncope No syncope or light headedness lately.    HTN (hypertension)  He has plenty of BP to consider Entresto.    Dyslipidemia  Lab Results   Component Value Date    CHOL 173 03/31/2013    TRIG 130 03/31/2013    HDL 41 03/31/2013    LDL 104 (H) 03/31/2013    VLDL 26 03/31/2013    NONHDLCHOL 132 03/31/2013    CHOLHDLC 4 04/17/2012      He can't tolerate any of the statins and isn't interested in any other medical therapy for his lipid disorder.    Paroxysmal atrial fibrillation (HCC)  According to his device check his AF burden is about 2% so Im continuing amiodarone.  He tolerates anti-coagulation without bleeding problems.      Current Medications (including today's revisions)  ??? allopurinol (ZYLOPRIM) 100 mg tablet TAKE 1 TABLET BY MOUTH DAILY AT BEDTIME   ??? amiodarone (CORDARONE) 200 mg tablet Take one tablet by mouth daily. Take with food.   ??? apixaban (ELIQUIS) 5 mg tablet Take one tablet by mouth twice daily.   ??? aspirin EC 81 mg tablet Take 81 mg by mouth daily. Take with food.   ??? carvediloL (COREG) 6.25 mg tablet TAKE 1 TABLET BY MOUTH TWICE DAILY WITH MEALS   ??? furosemide (LASIX) 40 mg tablet TAKE 1 TABLET BY MOUTH DAILY   ??? losartan (COZAAR) 25 mg tablet TAKE 1 TABLET BY MOUTH TWICE DAILY   ??? polyethylene glycol 3350 (MIRALAX) 17 g packet Take 1 packet by mouth daily. (Patient taking differently: Take 17 g by mouth as Needed.)   ??? potassium chloride (K-DUR) 10 mEq tablet TAKE 1 TABLET BY MOUTH TWICE DAILY WITH A MEAL AND A FULL GLASS OF WATER   ??? senna/docusate (SENOKOT-S) 8.6/50 mg tablet Take 1 tablet by mouth twice daily as needed. Take for constipation. Hold for loose stools.

## 2018-07-17 ENCOUNTER — Encounter: Admit: 2018-07-17 | Discharge: 2018-07-17 | Primary: Cardiovascular Disease

## 2018-07-17 MED ORDER — CARVEDILOL 6.25 MG PO TAB
ORAL_TABLET | Freq: Two times a day (BID) | ORAL | 3 refills | 90.00000 days | Status: DC
Start: 2018-07-17 — End: 2018-09-10

## 2018-07-17 MED ORDER — CARVEDILOL 6.25 MG PO TAB
ORAL_TABLET | Freq: Two times a day (BID) | 0 refills | Status: CN
Start: 2018-07-17 — End: ?

## 2018-07-28 NOTE — Assessment & Plan Note
Lab Results   Component Value Date    CHOL 173 03/31/2013    TRIG 130 03/31/2013    HDL 41 03/31/2013    LDL 104 (H) 03/31/2013    VLDL 26 03/31/2013    NONHDLCHOL 132 03/31/2013    [place] 4 04/17/2012      He can't tolerate any of the statins and isn't interested in any other medical therapy for his lipid disorder.

## 2018-07-28 NOTE — Assessment & Plan Note
No syncope or light headedness lately.

## 2018-07-28 NOTE — Assessment & Plan Note
According to his device check his AF burden is about 2% so I"m continuing amiodarone.  He tolerates anti-coagulation without bleeding problems.

## 2018-07-28 NOTE — Assessment & Plan Note
He has plenty of BP to consider Entresto.

## 2018-08-05 ENCOUNTER — Encounter: Admit: 2018-08-05 | Discharge: 2018-08-05 | Primary: Cardiovascular Disease

## 2018-08-06 ENCOUNTER — Encounter: Admit: 2018-08-06 | Discharge: 2018-08-06 | Primary: Cardiovascular Disease

## 2018-08-07 DIAGNOSIS — R55 Syncope and collapse: Secondary | ICD-10-CM

## 2018-08-07 DIAGNOSIS — I5022 Chronic systolic (congestive) heart failure: Secondary | ICD-10-CM

## 2018-08-07 DIAGNOSIS — Z95810 Presence of automatic (implantable) cardiac defibrillator: Secondary | ICD-10-CM

## 2018-08-08 ENCOUNTER — Ambulatory Visit: Admit: 2018-08-07 | Discharge: 2018-08-08 | Primary: Cardiovascular Disease

## 2018-08-08 DIAGNOSIS — I255 Ischemic cardiomyopathy: Secondary | ICD-10-CM

## 2018-08-08 DIAGNOSIS — I472 Ventricular tachycardia: Secondary | ICD-10-CM

## 2018-08-10 ENCOUNTER — Encounter: Admit: 2018-08-10 | Discharge: 2018-08-10 | Primary: Cardiovascular Disease

## 2018-08-10 NOTE — Telephone Encounter
Tried to call pt, no answer

## 2018-08-10 NOTE — Telephone Encounter
Spoke with pt, he stated he has no energy. He denies shortness of breath, denies chest pain, denies any swelling or weight gain. His main complaint is fatigue. Pt has an ICD and will send a remote check.

## 2018-08-11 MED ORDER — LEVOTHYROXINE 50 MCG PO TAB
50 ug | ORAL_TABLET | Freq: Every day | ORAL | 3 refills | 30.00000 days | Status: AC
Start: 2018-08-11 — End: ?

## 2018-08-11 NOTE — Telephone Encounter
Instructed pt his remote was normal and SDO recommends starting synthroid 0.05 mg/day. Pt agreeable to plan.

## 2018-09-04 ENCOUNTER — Encounter: Admit: 2018-09-04 | Discharge: 2018-09-04 | Primary: Cardiovascular Disease

## 2018-09-04 ENCOUNTER — Encounter: Admit: 2018-09-04 | Discharge: 2018-09-10 | Disposition: A | Discharge status: Home Health

## 2018-09-04 ENCOUNTER — Emergency Department: Admit: 2018-09-04 | Discharge: 2018-09-04

## 2018-09-04 DIAGNOSIS — I5023 Acute on chronic systolic (congestive) heart failure: Secondary | ICD-10-CM

## 2018-09-04 DIAGNOSIS — Z8679 Personal history of other diseases of the circulatory system: Secondary | ICD-10-CM

## 2018-09-04 DIAGNOSIS — N183 Chronic kidney disease, stage 3 (moderate): Secondary | ICD-10-CM

## 2018-09-04 DIAGNOSIS — T148XXA Other injury of unspecified body region, initial encounter: Secondary | ICD-10-CM

## 2018-09-04 DIAGNOSIS — R5383 Other fatigue: Secondary | ICD-10-CM

## 2018-09-04 DIAGNOSIS — I6782 Cerebral ischemia: Secondary | ICD-10-CM

## 2018-09-04 DIAGNOSIS — Z95810 Presence of automatic (implantable) cardiac defibrillator: Secondary | ICD-10-CM

## 2018-09-04 DIAGNOSIS — I509 Heart failure, unspecified: Secondary | ICD-10-CM

## 2018-09-04 DIAGNOSIS — Z955 Presence of coronary angioplasty implant and graft: Secondary | ICD-10-CM

## 2018-09-04 DIAGNOSIS — I480 Paroxysmal atrial fibrillation: Secondary | ICD-10-CM

## 2018-09-04 DIAGNOSIS — R0602 Shortness of breath: Secondary | ICD-10-CM

## 2018-09-04 DIAGNOSIS — I255 Ischemic cardiomyopathy: Secondary | ICD-10-CM

## 2018-09-04 DIAGNOSIS — E785 Hyperlipidemia, unspecified: Secondary | ICD-10-CM

## 2018-09-04 DIAGNOSIS — N289 Disorder of kidney and ureter, unspecified: Secondary | ICD-10-CM

## 2018-09-04 DIAGNOSIS — I10 Essential (primary) hypertension: Secondary | ICD-10-CM

## 2018-09-04 DIAGNOSIS — R55 Syncope and collapse: Secondary | ICD-10-CM

## 2018-09-04 DIAGNOSIS — R531 Weakness: Secondary | ICD-10-CM

## 2018-09-04 DIAGNOSIS — G319 Degenerative disease of nervous system, unspecified: Secondary | ICD-10-CM

## 2018-09-04 DIAGNOSIS — M1990 Unspecified osteoarthritis, unspecified site: Secondary | ICD-10-CM

## 2018-09-04 DIAGNOSIS — I2510 Atherosclerotic heart disease of native coronary artery without angina pectoris: Secondary | ICD-10-CM

## 2018-09-04 LAB — CBC AND DIFF
Lab: 0 10*3/uL (ref 0–0.20)
Lab: 0 10*3/uL (ref 0–0.45)
Lab: 0.3 10*3/uL (ref 0–0.80)
Lab: 0.4 10*3/uL — ABNORMAL LOW (ref 1.0–4.8)
Lab: 1 % — ABNORMAL LOW (ref >60)
Lab: 1 % — ABNORMAL LOW (ref >60)
Lab: 11 % — ABNORMAL LOW (ref 24–44)
Lab: 115 K/UL — ABNORMAL LOW (ref 150–400)
Lab: 16 % — ABNORMAL HIGH (ref 11–15)
Lab: 17 g/dL — ABNORMAL HIGH (ref 13.5–16.5)
Lab: 3.4 10*3/uL (ref 1.8–7.0)
Lab: 31 pg (ref 26–34)
Lab: 33 g/dL (ref 32.0–36.0)
Lab: 4.3 10*3/uL — ABNORMAL LOW (ref 4.5–11.0)
Lab: 5.6 M/UL — ABNORMAL HIGH (ref 4.4–5.5)
Lab: 53 % — ABNORMAL HIGH (ref 40–50)
Lab: 7 % (ref 4–12)
Lab: 80 % — ABNORMAL HIGH (ref 41–77)
Lab: 9.6 FL (ref 7–11)
Lab: 95 FL (ref 80–100)
Lab: 22 — ABNORMAL HIGH (ref <20.7)

## 2018-09-04 LAB — URINALYSIS DIPSTICK REFLEX TO CULTURE
Lab: NEGATIVE
Lab: NEGATIVE

## 2018-09-04 LAB — POC TROPONIN: Lab: 0 ng/mL (ref 0.00–0.05)

## 2018-09-04 LAB — IRON + BINDING CAPACITY + %SAT+ FERRITIN
Lab: 163 ng/mL (ref 30–300)
Lab: 323 ug/dL (ref 270–380)
Lab: 68 ug/dL (ref 50–185)

## 2018-09-04 LAB — URINALYSIS MICROSCOPIC REFLEX TO CULTURE

## 2018-09-04 LAB — COMPREHENSIVE METABOLIC PANEL
Lab: 130 MMOL/L — ABNORMAL LOW (ref 137–147)
Lab: 4.6 MMOL/L (ref 3.5–5.1)

## 2018-09-04 LAB — TSH WITH FREE T4 REFLEX: Lab: 5.2 uU/mL — ABNORMAL HIGH (ref 0.35–5.00)

## 2018-09-04 LAB — COVID-19 (SARS-COV-2) PCR

## 2018-09-04 LAB — FREE T4-FREE THYROXINE: Lab: 1.9 ng/dL — ABNORMAL HIGH (ref 0.6–1.6)

## 2018-09-04 LAB — BNP POC ER: Lab: 184 pg/mL — ABNORMAL HIGH (ref 0–100)

## 2018-09-04 MED ORDER — ASPIRIN 81 MG PO TBEC
81 mg | Freq: Every day | ORAL | 0 refills | Status: DC
Start: 2018-09-04 — End: 2018-09-10
  Administered 2018-09-04 – 2018-09-10 (×7): 81 mg via ORAL

## 2018-09-04 MED ORDER — APIXABAN 5 MG PO TAB
5 mg | Freq: Two times a day (BID) | ORAL | 0 refills | Status: DC
Start: 2018-09-04 — End: 2018-09-04

## 2018-09-04 MED ORDER — POLYETHYLENE GLYCOL 3350 17 GRAM PO PWPK
1 | Freq: Every day | ORAL | 0 refills | Status: DC | PRN
Start: 2018-09-04 — End: 2018-09-10

## 2018-09-04 MED ORDER — CARVEDILOL 6.25 MG PO TAB
6.25 mg | Freq: Two times a day (BID) | ORAL | 0 refills | Status: DC
Start: 2018-09-04 — End: 2018-09-04

## 2018-09-04 MED ORDER — CARVEDILOL 12.5 MG PO TAB
12.5 mg | Freq: Two times a day (BID) | ORAL | 0 refills | Status: DC
Start: 2018-09-04 — End: 2018-09-06
  Administered 2018-09-04 – 2018-09-06 (×4): 12.5 mg via ORAL

## 2018-09-04 MED ORDER — AMIODARONE 200 MG PO TAB
200 mg | Freq: Every day | ORAL | 0 refills | Status: DC
Start: 2018-09-04 — End: 2018-09-10
  Administered 2018-09-04 – 2018-09-10 (×7): 200 mg via ORAL

## 2018-09-04 MED ORDER — LEVOTHYROXINE 50 MCG PO TAB
50 ug | Freq: Every day | ORAL | 0 refills | Status: DC
Start: 2018-09-04 — End: 2018-09-10
  Administered 2018-09-05 – 2018-09-10 (×6): 50 ug via ORAL

## 2018-09-04 MED ORDER — EZETIMIBE 10 MG PO TAB
10 mg | Freq: Every day | ORAL | 0 refills | Status: DC
Start: 2018-09-04 — End: 2018-09-05
  Administered 2018-09-05: 16:00:00 10 mg via ORAL

## 2018-09-04 MED ORDER — FUROSEMIDE 10 MG/ML IJ SOLN
40 mg | Freq: Two times a day (BID) | INTRAVENOUS | 0 refills | Status: DC
Start: 2018-09-04 — End: 2018-09-06
  Administered 2018-09-04 – 2018-09-06 (×4): 40 mg via INTRAVENOUS

## 2018-09-04 MED ORDER — ALLOPURINOL 100 MG PO TAB
100 mg | Freq: Every evening | ORAL | 0 refills | Status: DC
Start: 2018-09-04 — End: 2018-09-10
  Administered 2018-09-05 – 2018-09-10 (×6): 100 mg via ORAL

## 2018-09-04 MED ORDER — APIXABAN 2.5 MG PO TAB
2.5 mg | Freq: Two times a day (BID) | ORAL | 0 refills | Status: DC
Start: 2018-09-04 — End: 2018-09-08
  Administered 2018-09-05 – 2018-09-08 (×8): 2.5 mg via ORAL

## 2018-09-04 MED ORDER — SENNOSIDES-DOCUSATE SODIUM 8.6-50 MG PO TAB
1 | Freq: Two times a day (BID) | ORAL | 0 refills | Status: DC | PRN
Start: 2018-09-04 — End: 2018-09-10

## 2018-09-04 MED ORDER — PERFLUTREN LIPID MICROSPHERES 1.1 MG/ML IV SUSP
1-20 mL | Freq: Once | INTRAVENOUS | 0 refills | Status: CP | PRN
Start: 2018-09-04 — End: ?
  Administered 2018-09-04: 22:00:00 2 mL via INTRAVENOUS

## 2018-09-04 MED ORDER — ACETAMINOPHEN 325 MG PO TAB
650 mg | ORAL | 0 refills | Status: DC | PRN
Start: 2018-09-04 — End: 2018-09-10

## 2018-09-04 MED ORDER — LOSARTAN 25 MG PO TAB
25 mg | Freq: Two times a day (BID) | ORAL | 0 refills | Status: DC
Start: 2018-09-04 — End: 2018-09-07
  Administered 2018-09-04 – 2018-09-07 (×6): 25 mg via ORAL

## 2018-09-04 MED ORDER — FUROSEMIDE 10 MG/ML IJ SOLN
40 mg | Freq: Once | INTRAVENOUS | 0 refills | Status: CP
Start: 2018-09-04 — End: ?
  Administered 2018-09-04: 18:00:00 40 mg via INTRAVENOUS

## 2018-09-04 NOTE — Progress Notes
Patient arrived to room # [id number] via cart accompanied by PCA. Patient transferred to the bed with assistance. Bedside safety checks completed. Initial patient assessment completed. Refer to flowsheet for details.    Admission skin assessment completed with: Caylin, RN    Pressure injury present on arrival?: No    1. Head/Face/Neck: No  2. Trunk/Back: No  3. Upper Extremities: No  4. Lower Extremities: No  5. Pelvic/Coccyx: No  6. Assessed for device associated injury? No  7. Malnutrition Screening Tool (Nursing Nutrition Assessment) Completed? No    See Doc Flowsheet for additional wound details.     INTERVENTIONS:

## 2018-09-04 NOTE — Consults
Heart Failure Consult    NAME:Logan Bright             MRN: [id number]                 DOB:1935-12-06          AGE: 83 y.o.  ADMISSION DATE: 09/04/2018             DAYS ADMITTED: LOS: 0 days      Principal Problem:    Acute on chronic systolic heart failure (HCC)  Active Problems:    Ischemic cardiomyopathy    HTN (hypertension)    Dyslipidemia    CKD (chronic kidney disease)    Automatic implantable cardioverter-defibrillator in situ    Recurrent ventricular tachycardia w/ 2 VT ablations 04/2013    Paroxysmal atrial fibrillation (HCC)    Acute on chronic systolic congestive heart failure (HCC)    Hyponatremia    Hypothyroidism      Logan Bright is a 83 y.o. male with chronic systolic heart failure, ischemic cardiomyopathy, CAD, hypertension, neurocardiogenic syncope, paroxysmal atrial fibrillation with chronic anticoagulation and amiodarone therapy, VT s/p VT ablations X2 both April 2015, ICD in situ, cryptogenic stroke,.  He follows with Dr. Edwinna Areola in the cardiology clinic and was last seen 07/16/2018 with no change in medications (the cost of Logan Bright was looked into but was too expensive for the patient).  Patient presented to [facility] ED 8/7 with complaint of weakness/fatigue and SOB x3 weeks, which he believes was noticeably increased after increasing Eliquis by his PCP.     Recommendations:  Today:  1. Echocardiogram ordered and pending.  In the process of being performed at the time of patient examination and IVC noted to be enlarged and non-collapsible with inspiration.  2. Agree w/ Lasix 40 mg IV BID for hypervolemia. (Pt requesting no dose tonight and start in AM so he can sleep tonight. Order adjusted.)   3. Device interrogation to assess RV pacing (previously 100% on last check 7/13, 63.6% 4/10, and prior to that 43% in January) and adjustments as indicated to allow for reduced RV pacing. Dr. Pierre Bali spoke with Dr. Derrell Lolling in person to review device results and make adjustments in settings as indicated to reduce RV pacing. Dr. Derrell Lolling is on EP service over the weekend.  4. K/L FLC serum and urine immunofixation ordered to assess for amyloidosis.  5. TC???PYP ordered to evaluate for amyloidosis given increased IVS and low voltage EKG.  6. TSH 5.25, FT4 1.9.  7. Iron panel: TI 68, TIBC 323, 21% sat, ferritin 163   8. FLP in AM and then start Zetia 10 mg daily 8/8.       Ongoing:  1. BMP once a day. Magnesium level daily.  Keep Potassium greater than 4.0 and Magnesium greater than 2.0.  2. 2000mg  sodium dietary restriction.  3. Fluid Restriction:1.5  4. Strict I/O. Goal output: net neg 2L/24 hour  5. Daily standing scale weight. Goal Dry Weight: ~200-205 lbs        Primary team responsible for placing Post???[facility] Appointment Request order set for Cardiology: Heart Failure appointment request within 24-48 hours of discharge. (Please do not place order any earlier in effort to reduce possible need for cancellation and rescheduling of visit).      Pt examined and discussed with Dr. Pierre Bali.      Glennis Brink, APRN-NP  [phone]        Assessment:   Acute  on chronic systolic and diastolic HFrEF,  EF: 20%.  ICM  Echocardiogram 03/31/2013: LV moderate concentric hypertrophy, LVEF 20%, grade 1 LV diastolic dysfunction, LVIDD 5.2 cm.  RV normal size wall thickness, borderline low ejection fraction.  LA mildly dilated.  CVP 0-5 mmHg.  Mild AVR.  Ascending aorta mildly dilated, sinuses of Valsalva moderately dilated.  IVS 1.4 cm.  Echocardiogram 09/04/2018: Results pending  Major Complications or Comorbidities [facility]): acute/ acute on chronic systolic and/or diastolic heart failure  NYHA functional class III (marked limitation of physical activity - comfortable at rest, but less than ordinary activity causes symptoms of HF e.g., getting dressed or standing from a sitting position), ACC Stage C (structural heart disease with prior or current symptoms of HF).   He presents with signs of hypervolemia with bi  ventricular failure without signs of low flow state.  Admission BNP: 1849      Admission Weight: 97.5 kg (215 lb)        Most recent weights (inpatient):   Vitals:    09/04/18 0925 09/04/18 1651   Weight: 97.5 kg (215 lb) 97.5 kg (215 lb)        I/O: net I/O for entire hospital stay: N/A mL, net for last 24 hrs NA mL    Diuretic Therapy    Prior to admission dose    furosemide 40 mg daily   Given on admission    8/7: Furosemide 40 mg IV   Daily Dosing          GDMT PTA Changes   BB  carvedilol 6.25 mg twice daily    ACEI/ARB/ARNI  losartan 25 mg daily  (Looked into the cost of Entresto 06/2018 which would have been $700 per month out of pocket)    Aldosterone Antagonist  No (Will be addressed at next clinic visit after discussion of risks/benefits with Heart Failure Cardiologist)    Hydralazine/Nitrate  No (Not receiving therapeutic doses of ACE-i/ARB and BB)    Ivabradine No; Not treated with maximally tolerated dose beta blockers or beta blockers contraindicated    HRMT  Yes (ICD)  8/7: See device interrogation below   Anticoagulation for Afib/flutter   Eliquis 5 mg twice daily  continue       Testing/Procedures   8/7: Ordered K/L FLC, serum and urine immunofixation, and TC PYP to assess for amyloidosis     CAD s/p stenting 2002  HLP  -Dobutamine stress echocardiogram 09/24/2011: Very mild concentric hypertrophy, 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  Hyperdynamic contraction of inferior segments under stress.  -He does not tolerate statins and is not interested in other medical therapy for his lipid disorder  - 04/14/18 Regadenoson MPI : Extensive, severely reduced mid to apical uptake defects in all cardiac segments, partially fixed.  Suggestive of extensive prior myocardial injury with mild peri-infarct ischemia (in the mid segments).this is unchanged compared to prior ADAC study (04/21/2012). EF 18%.   > Continue PTA aspirin 81 mg daily  > FLP in a.m. 8/8 and then start Zetia 10 mg daily    Hypertension  -Currently controlled  > Continue PTA.carvedilol and losartan    Hyponatremia  -On admission sodium 130  > BMP daily  > Diuresis as an table above    Paroxysmal atrial fibrillation  -Device interrogation 06/23/2018: A. fib burden 2.1%  > Continue PTA amiodarone 200 mg daily and Eliquis 5 mg twice daily    VT s/p VT ablation X2 both in April 2015  ICD in situ  -Device interrogation 08/10/2018: Set at DDIR, RV pacing 100%.  No atrial or ventricular events  > Continue PTA amiodarone 200 mg daily  > Device interrogation 8/7 to assess RV pacing (previously 100% on last check 7/13, 63.6% 4/10, and prior to that 43% in January) and adjustments as indicated to allow for reduced RV pacing. Dr. Pierre Bali spoke with Dr. Derrell Lolling in person to review device results and make adjustments in settings as indicated to reduce RV pacing. Dr. Derrell Lolling is on EP service over the weekend.    CKD, stage  Possible cardiorenal  -Most recent baseline appears to be 1.6???1.7  -Creatinine on admission 8/7 is 1.63  > Daily BMP    Hypothyroidism  - Started Synthroid 0.05 mg/day 08/10/2018 by Dr. Barry Dienes  -  TSH 5.25, FT4 1.9 on 8/7, make no adjustments as he just started Synthroid few weeks ago      _____________________________________________________________________________    Reason for Consultation:  Evaluation and recommendations re: heart failure     History of Present Illness: Logan Bright is a 83 y.o. male who we are asked to see for evaluation of heart failure.  He currently complains of fatigue, shortness of breath, dyspnea on exertion and peripheral edema. He currently denies paroxysmal nocturnal dyspnea, orthopnea, lightheadedness, positional lightheadedness, near syncope, palpitations, chest pain, abdominal fullness and muscle cramping. Review of Systems:  A comprehensive review of systems was negative except for: as above      Medical History:   Diagnosis Date   ??? Arthritis    ??? CAD (coronary artery disease)    ??? Dyslipidemia    ??? Fracture     RT ANKLE   ??? History of ventricular tachycardia    ??? HTN (hypertension)    ??? ICD (implantable cardiac defibrillator) in place    ??? Ischemic cardiomyopathy    ??? Neurologic cardiac syncope    ??? Renal insufficiency     mild   ??? S/P coronary artery stent placement      Surgical History:   Procedure Laterality Date   ??? EXTERNAL FIXATION APPLICATION ANKLE Right 11/13/2015    Performed by Azzie Glatter, MD at [facility] OR   ??? EXTERNAL FIXATION REMOVAL RIGHT ANKLE Right 12/08/2015    Performed by Azzie Glatter, MD at [facility] OR   ??? OPEN REDUCTION INTERNAL FIXATION RIGHT ANKLE Right 12/08/2015    Performed by Azzie Glatter, MD at [facility] OR   ??? CARDIAC DEFIBRILLATOR PLACEMENT     ??? HX HEART CATHETERIZATION     ??? HX ICD PLACEMENT     ??? HX PACEMAKER PLACEMENT       Family History   Problem Relation Age of Onset   ??? Cancer Mother    ??? Gout Mother    ??? Cancer Sister    ??? Diabetes Sister    ??? Heart Attack Sister    ??? Hypertension Sister    ??? Gout Sister    ??? Arthritis-osteo Sister    ??? Stroke Sister    ??? Thyroid Disease Sister    ??? Migraines Sister      Social History     Socioeconomic History   ??? Marital status: Widowed     Spouse name: Not on file   ??? Number of children: Not on file   ??? Years of education: Not on file   ??? Highest education level: Not on file   Occupational History   ??? Not on file  Tobacco Use   ??? Smoking status: Never Smoker   ??? Smokeless tobacco: Never Used   Substance and Sexual Activity   ??? Alcohol use: No   ??? Drug use: No   ??? Sexual activity: Not on file   Other Topics Concern   ??? Not on file   Social History Narrative   ??? Not on file        Objective:    Allergies:   Allergies   Allergen Reactions   ??? Colchicine NAUSEA AND VOMITING        Medications:  Scheduled Meds:Continuous Infusions: PRN and Respiratory Meds:    Medications Prior to Admission   Medication Sig Dispense Refill Last Dose   ??? allopurinol (ZYLOPRIM) 100 mg tablet TAKE 1 TABLET BY MOUTH DAILY AT BEDTIME 90 tablet 3 Taking   ??? amiodarone (CORDARONE) 200 mg tablet Take one tablet by mouth daily. Take with food. 90 tablet 3 Taking   ??? apixaban (ELIQUIS) 5 mg tablet Take one tablet by mouth twice daily. 60 tablet 3 Taking   ??? aspirin EC 81 mg tablet Take 81 mg by mouth daily. Take with food.   Taking   ??? carvediloL (COREG) 6.25 mg tablet TAKE 1 TABLET BY MOUTH TWICE DAILY WITH MEALS 180 tablet 3    ??? furosemide (LASIX) 40 mg tablet TAKE 1 TABLET BY MOUTH DAILY 90 tablet 3 Taking   ??? levothyroxine (SYNTHROID) 50 mcg tablet Take one tablet by mouth daily 30 minutes before breakfast. 90 tablet 3    ??? losartan (COZAAR) 25 mg tablet TAKE 1 TABLET BY MOUTH TWICE DAILY 540 tablet 3 Taking   ??? polyethylene glycol 3350 (MIRALAX) 17 g packet Take 1 packet by mouth daily. (Patient taking differently: Take 17 g by mouth as Needed.) 12 each  Not Taking   ??? potassium chloride (K-DUR) 10 mEq tablet TAKE 1 TABLET BY MOUTH TWICE DAILY WITH A MEAL AND A FULL GLASS OF WATER 180 tablet 3 Taking   ??? senna/docusate (SENOKOT-S) 8.6/50 mg tablet Take 1 tablet by mouth twice daily as needed. Take for constipation. Hold for loose stools. 30 tablet 1 Taking                             Vital Signs:  Last Filed                Vital Signs: 24 Hour Range   BP: 142/89 (08/07 1441)  Temp: 37.1 ???C (98.7 ???F) (08/07 6295)  Pulse: 62 (08/07 1441)  Respirations: 24 PER MINUTE (08/07 1441)  SpO2: 97 % (08/07 1441)  SpO2 Pulse: 65 (08/07 1300)  Height: 182.9 cm (6') (08/07 1651)  BP: (142-151)/(84-96)   Temp:  [37.1 ???C (98.7 ???F)]   Pulse:  [60-69]   Respirations:  [24 PER MINUTE-30 PER MINUTE]   SpO2:  [96 %-99 %]            Wt Readings from Last 10 Encounters:   09/04/18 97.5 kg (215 lb)   07/16/18 97.3 kg (214 lb 9.6 oz)   05/14/18 96.2 kg (212 lb) 04/07/18 97.5 kg (215 lb)   03/10/18 96.8 kg (213 lb 4.8 oz)   08/28/17 100.6 kg (221 lb 12.8 oz)   02/25/17 101.6 kg (224 lb)   08/22/16 102.5 kg (226 lb)   03/14/16 97.5 kg (215 lb)   02/06/16 97.5 kg (215 lb)       Physical Exam:  General Appearance: no distress, obese  Skin: warm and dry  Lips & Oral Mucosa: no pallor or cyanosis   Digits and Nails: normal color, smooth symmetric nails and digits  Eyes: conjunctivae and lids normal  Neck Veins: JVP 9cm, HJR positive???difficult to assess given body habitus  Chest Inspection: left chest incision well healed  Auscultation/Percussion: breathing comfortably, anterior lung sounds (posterior not assessed due to echocardiogram at time of examination) clear to auscultation, no rales or rhonchi, no wheezing  Cardiac Auscultation: Regular rhythm, S1, S2, no S3 or S4, no murmur  Radial Arteries: normal symmetric radial pulses  Pedal Pulses: pulses 2+, symmetric  Lower Extremity Edema: 2-3 + pitting bilateral that extends into the mid thighs  Abdominal Exam: soft, non-tender, bowel sounds normal, no hepatomegaly  Gait & Station: Patient resting in bed  Muscle Strength: normal strength and tone  Orientation: clear historian, good insight    Laboratory Review:   CBC w/Diff    Lab Results   Component Value Date/Time    WBC 4.3 (L) 09/04/2018 09:51 AM    RBC 5.60 (H) 09/04/2018 09:51 AM    HGB 17.7 (H) 09/04/2018 09:51 AM    HCT 53.2 (H) 09/04/2018 09:51 AM    MCV 95.1 09/04/2018 09:51 AM    MCH 31.6 09/04/2018 09:51 AM    MCHC 33.2 09/04/2018 09:51 AM    RDW 16.7 (H) 09/04/2018 09:51 AM    PLTCT 115 (L) 09/04/2018 09:51 AM    MPV 9.6 09/04/2018 09:51 AM    Lab Results   Component Value Date/Time    NEUT 80 (H) 09/04/2018 09:51 AM    ANC 3.42 09/04/2018 09:51 AM    LYMA 11 (L) 09/04/2018 09:51 AM    ALC 0.47 (L) 09/04/2018 09:51 AM    MONA 7 09/04/2018 09:51 AM    AMC 0.31 09/04/2018 09:51 AM    EOSA 1 09/04/2018 09:51 AM    AEC 0.05 09/04/2018 09:51 AM BASA 1 09/04/2018 09:51 AM    ABC 0.04 09/04/2018 09:51 AM         Chemistry    Lab Results   Component Value Date/Time    NA 130 (L) 09/04/2018 09:51 AM    K 4.6 09/04/2018 09:51 AM    CL 99 09/04/2018 09:51 AM    CO2 21 09/04/2018 09:51 AM    GAP 10 09/04/2018 09:51 AM    BUN 28 (H) 09/04/2018 09:51 AM    CR 1.63 (H) 09/04/2018 09:51 AM    GLU 110 (H) 09/04/2018 09:51 AM    Lab Results   Component Value Date/Time    CA 8.6 09/04/2018 09:51 AM    PO4 4.0 05/03/2013 07:10 PM    ALBUMIN 3.8 09/04/2018 09:51 AM    TOTPROT 6.9 09/04/2018 09:51 AM    ALKPHOS 124 (H) 09/04/2018 09:51 AM    AST 30 09/04/2018 09:51 AM    ALT 28 09/04/2018 09:51 AM    TOTBILI 1.0 09/04/2018 09:51 AM    GFR 41 (L) 09/04/2018 09:51 AM    GFRAA 49 (L) 09/04/2018 09:51 AM            Renal Function    Lab Results   Component Value Date/Time    NA 130 (L) 09/04/2018 09:51 AM    K 4.6 09/04/2018 09:51 AM    CL 99 09/04/2018 09:51 AM    CO2 21 09/04/2018 09:51 AM    GAP 10 09/04/2018 09:51 AM    BUN 28 (H) 09/04/2018  09:51 AM    BUN 20.0 07/16/2018    BUN 20.0 04/07/2018    Lab Results   Component Value Date/Time    CR 1.63 (H) 09/04/2018 09:51 AM    CR 1.78 (H) 07/16/2018    CR 1.61 (H) 04/07/2018    GLU 110 (H) 09/04/2018 09:51 AM    CA 8.6 09/04/2018 09:51 AM    PO4 4.0 05/03/2013 07:10 PM    ALBUMIN 3.8 09/04/2018 09:51 AM        Lipid Profile INR   Lab Results   Component Value Date    CHOL 173 03/31/2013    TRIG 130 03/31/2013    HDL 41 03/31/2013    LDL 104 (H) 03/31/2013    VLDL 26 03/31/2013    NONHDLCHOL 132 03/31/2013    CHOLHDLC 4 04/17/2012         Lab Results   Component Value Date    INR 1.0 11/12/2015          Chest X-Ray: 8/7  Mild cardiomegaly with findings of CHF. Increasing right basilar   consolidation may be atelectasis. Underlying pneumonia could be present.   Small right pleural effusion.     Tele/ECG: AV paced 60s    Echocardiogram Details:   Echo Results  (Last 3 results in the past 3 years) Echo EF LVIDD LA Size IVS LVPW Rest PAP    (09/04/18)  20 (09/04/18)  6.39 (09/04/18)  3.92 (09/04/18)  1.00 (09/04/18)  0.78 (09/04/18)  46       pending      CT head 8/7  1. No acute intracranial hemorrhage or mass effect.  2. Generalized cerebral and cerebellar volume loss.  3. Bilateral deep and periventricular white matter low-attenuation   suggestive of chronic small vessel ischemic changes.  4. Right mastoid fluid  5. Partially visualized posterior subcutaneous mass or cyst measuring at   least 2.1 cm. Direct clinical correlation is suggested

## 2018-09-04 NOTE — Consults
Neurology Consultation      Name:  Logan Bright                                             MRN:  [id number]   Admission Date:  09/04/2018  LOS: 0 days  [id number]                 ASSESSMENT:    Principal Problem:    Acute on chronic systolic heart failure (HCC)  Active Problems:    Ischemic cardiomyopathy    HTN (hypertension)    Dyslipidemia    CKD (chronic kidney disease)    Automatic implantable cardioverter-defibrillator in situ    Recurrent ventricular tachycardia w/ 2 VT ablations 04/2013    Paroxysmal atrial fibrillation (HCC)    Acute on chronic systolic congestive heart failure (HCC)    Hyponatremia    Hypothyroidism      Consult type: Opinion with orders    Logan Bright is a 83 y.o.  male with PMH of  that neurology is consulted to evaluate for weakness.  Patient initially presented with dyspnea and was admitted on  09/04/2018 for acute on chronic heart failure.    Neuro exam reveals no focal weakness, 5/5 strength in UE and LE. Able to complete tandem gait, heel walk, and toe walk without difficulty. No other focal deficits on neurological exam.     Imaging/Diagnostic Studies:  - CT 8/7   1. No acute intracranial hemorrhage or mass effect.  2. Generalized cerebral and cerebellar volume loss.  3. Bilateral deep and periventricular white matter low-attenuation   suggestive of chronic small vessel ischemic changes.    Impression:   Logan Bright was evaluated for concern for weakness that may be indicative of stroke. He was informed of a prior stroke by outside provider, however denied any residual deficits or knowledge of stroke occurrence and location. He denies any significant loss of function at home and performs all of his ADLs without assistance. CT completed here did not show significant hemorrhagic infarction areas. No prior MRIs in [facility] system. At this time (8/7 4:20 p.m.), did not note any focal weakness on neurological exam.  Patient demonstrated 5/5 strength in all muscle groups (flexors and extensors) in upper extremities and lower extremities. Reflexes intact and 2+ throughout. He was also able to complete tandem walk, walk on heels, and walk on toes. Given his relatively independent functional status, lack of acute changes, and non-focal exam, there is low suspicion that patient had a new infarction.    RECOMMENDATIONS:  > no further imaging required unless new neurological deficits appear       Thank you for allowing [name] to participate in the care of this patient.   Patient seen and plan of care discussed with Dr. Alvester Morin.    Dorisann Frames M.D.  PGY-2 Neurology  Pager [phone], on voalte  __________________________________________________________________________________    History of Present Illness: Logan Bright is a 83 y.o. male being seen for neurologic evaluation regarding weakness. He denies problems with walking and initially stated he does not have any weakness. Upon further probing patient stated he felt his left lower chest felt weak, but could not further detail what he meant. He stated he has had a stroke that he recently found out about, but was told it was an old stroke. He himself has never noted  any deficits in his vision, walking, strength. He also denies any sensory deficits; numbness or tingling por pain.     PMH: hx of afib, heart failure, ventricular tachycardia, prior stroke of undeterminate age  SH: lives in home by himself, performs all ADLs independently. Retired, previously worked for 26 yrs.     ROS  + dyspnea  + leg swelling bilaterally  - Denies syncope, falls, or LOS  - Denies focal weakness  - Denies headache, vision changes  - Denies recent illness, infections, or fevers  - Denies nausea, vomiting  - Denies lack of appetite  - Denies diarrhea, constipation  - Denies rash  - Denies problems with ambulation    Medical History:   Diagnosis Date   ??? Arthritis    ??? CAD (coronary artery disease) ??? Dyslipidemia    ??? Fracture     RT ANKLE   ??? History of ventricular tachycardia    ??? HTN (hypertension)    ??? ICD (implantable cardiac defibrillator) in place    ??? Ischemic cardiomyopathy    ??? Neurologic cardiac syncope    ??? Renal insufficiency     mild   ??? S/P coronary artery stent placement      Surgical History:   Procedure Laterality Date   ??? EXTERNAL FIXATION APPLICATION ANKLE Right 11/13/2015    Performed by Azzie Glatter, MD at [facility] OR   ??? EXTERNAL FIXATION REMOVAL RIGHT ANKLE Right 12/08/2015    Performed by Azzie Glatter, MD at [facility] OR   ??? OPEN REDUCTION INTERNAL FIXATION RIGHT ANKLE Right 12/08/2015    Performed by Azzie Glatter, MD at [facility] OR   ??? CARDIAC DEFIBRILLATOR PLACEMENT     ??? HX HEART CATHETERIZATION     ??? HX ICD PLACEMENT     ??? HX PACEMAKER PLACEMENT       Social History     Socioeconomic History   ??? Marital status: Widowed     Spouse name: Not on file   ??? Number of children: Not on file   ??? Years of education: Not on file   ??? Highest education level: Not on file   Occupational History   ??? Not on file   Tobacco Use   ??? Smoking status: Never Smoker   ??? Smokeless tobacco: Never Used   Substance and Sexual Activity   ??? Alcohol use: No   ??? Drug use: No   ??? Sexual activity: Not on file   Other Topics Concern   ??? Not on file   Social History Narrative   ??? Not on file      Family History   Problem Relation Age of Onset   ??? Cancer Mother    ??? Gout Mother    ??? Cancer Sister    ??? Diabetes Sister    ??? Heart Attack Sister    ??? Hypertension Sister    ??? Gout Sister    ??? Arthritis-osteo Sister    ??? Stroke Sister    ??? Thyroid Disease Sister    ??? Migraines Sister          Immunizations (includes history and patient reported):   There is no immunization history on file for this patient.        Allergies:  Colchicine    Medications:  Medications Prior to Admission   Medication Sig   ??? allopurinol (ZYLOPRIM) 100 mg tablet TAKE 1 TABLET BY MOUTH DAILY AT BEDTIME ??? amiodarone (CORDARONE) 200 mg tablet Take  one tablet by mouth daily. Take with food.   ??? apixaban (ELIQUIS) 5 mg tablet Take one tablet by mouth twice daily.   ??? aspirin EC 81 mg tablet Take 81 mg by mouth daily. Take with food.   ??? carvediloL (COREG) 6.25 mg tablet TAKE 1 TABLET BY MOUTH TWICE DAILY WITH MEALS   ??? furosemide (LASIX) 40 mg tablet TAKE 1 TABLET BY MOUTH DAILY   ??? levothyroxine (SYNTHROID) 50 mcg tablet Take one tablet by mouth daily 30 minutes before breakfast.   ??? losartan (COZAAR) 25 mg tablet TAKE 1 TABLET BY MOUTH TWICE DAILY   ??? polyethylene glycol 3350 (MIRALAX) 17 g packet Take 1 packet by mouth daily. (Patient taking differently: Take 17 g by mouth as Needed.)   ??? potassium chloride (K-DUR) 10 mEq tablet TAKE 1 TABLET BY MOUTH TWICE DAILY WITH A MEAL AND A FULL GLASS OF WATER   ??? senna/docusate (SENOKOT-S) 8.6/50 mg tablet Take 1 tablet by mouth twice daily as needed. Take for constipation. Hold for loose stools.     No current facility-administered medications on file prior to encounter.      Current Outpatient Medications on File Prior to Encounter   Medication Sig Dispense Refill   ??? allopurinol (ZYLOPRIM) 100 mg tablet TAKE 1 TABLET BY MOUTH DAILY AT BEDTIME 90 tablet 3   ??? amiodarone (CORDARONE) 200 mg tablet Take one tablet by mouth daily. Take with food. 90 tablet 3   ??? apixaban (ELIQUIS) 5 mg tablet Take one tablet by mouth twice daily. 60 tablet 3   ??? aspirin EC 81 mg tablet Take 81 mg by mouth daily. Take with food.     ??? carvediloL (COREG) 6.25 mg tablet TAKE 1 TABLET BY MOUTH TWICE DAILY WITH MEALS 180 tablet 3   ??? furosemide (LASIX) 40 mg tablet TAKE 1 TABLET BY MOUTH DAILY 90 tablet 3   ??? levothyroxine (SYNTHROID) 50 mcg tablet Take one tablet by mouth daily 30 minutes before breakfast. 90 tablet 3   ??? losartan (COZAAR) 25 mg tablet TAKE 1 TABLET BY MOUTH TWICE DAILY 540 tablet 3   ??? polyethylene glycol 3350 (MIRALAX) 17 g packet Take 1 packet by mouth daily. (Patient taking differently: Take 17 g by mouth as Needed.) 12 each    ??? potassium chloride (K-DUR) 10 mEq tablet TAKE 1 TABLET BY MOUTH TWICE DAILY WITH A MEAL AND A FULL GLASS OF WATER 180 tablet 3   ??? senna/docusate (SENOKOT-S) 8.6/50 mg tablet Take 1 tablet by mouth twice daily as needed. Take for constipation. Hold for loose stools. 30 tablet 1       Physical Exam:  Vital Signs: Last Filed In 24 Hours Vital Signs: 24 Hour Range   BP: 124/76 (08/07 1746)  Temp: 37.1 ???C (98.7 ???F) (08/07 1610)  Pulse: 61 (08/07 1746)  Respirations: 20 PER MINUTE (08/07 1656)  SpO2: 97 % (08/07 1656)  SpO2 Pulse: 65 (08/07 1300)  Height: 182.9 cm (72) (08/07 1651) BP: (124-151)/(76-96)   Temp:  [37.1 ???C (98.7 ???F)]   Pulse:  [60-69]   Respirations:  [20 PER MINUTE-30 PER MINUTE]   SpO2:  [96 %-99 %]           HEENT: normocephalic, eyes open with no discharge, nares patent, oropharynx is clear with no lesions, palate intact, no bruits  Chest: normal configuration, non labored breathing, chest rise equal b/l   CV: normal rate and regular rhythm, no murmur, distal pulses palpable, LE edema b/l  Pulmonary: lungs are clear bilaterally, no wheezes/crackles/rales  Abd: soft, non- tender, no masses, no organomegaly  Skin: no rashes or lesions  Psych: normal     Neuro:  Mental status: Oriented to person/place/situation. Unable to give exact date.  Memory: Recent and remote memory intact  Attention: Good span, follows conversation.   Fund of knowledge: Appropriate  Level of consciousness: Alert     Speech:    Fluency: Normal   Comprehension: Normal   Articulation: Normal   Repetition: Normal    Naming: Normal     CN II-XII: Fundoscopic exam without obvious abnormalities. Visual fields intact in all fields, PERRL, EOMI, facial sensation intact. Symmetrical facial movement. Hearing grossly intact to conversation. Strong cough, elevates palate b/l, uvula midline. Strong shoulder shrug. Tongue midline. Motor: Normal tone and bulk. No abnormal movement, fasciculation or pronator drift.   NF5  NE5  SA  EF  EE  WE  WF  FF  FE  FA  TA  HF  HA  HE  KF  KE  DF  PF  In  Ev  TF  TE    R     5  5  5  5  5  5  5  5  5  5  5  5  5  5  5  5  5  5  5  5     L    5  5  5  5  5  5  5  5  5  5  5  5  5  5  5  5  5  5  5  5       Sensory:   Light touch: intact, without sensory level   Pin prick: intact   Vibration (tuning fork 128 Hz): intact   Proprioception: intact    Romberg sign: absent   Cold temp: intact     Reflexes: No clonus, hofmann, cross adductor. Babinski absent.   Right Left   Triceps 2 2   Biceps 2 2   Brachioradialis 2 2   Patella 2 2   Ankle 2 2     Cerebellar Funciton/fine movement: normal finger to nose, heel to shin, finger tapping, RAMs.  Gait: normal rise from seated position; normal stride length and arm swing, no decompensation of turns. Patient able to walk on heels, toes and in tandem.     Lab/Radiology/Other Diagnostic Tests:  Hematology:    Lab Results   Component Value Date    HGB 17.7 09/04/2018    HCT 53.2 09/04/2018    PLTCT 115 09/04/2018    WBC 4.3 09/04/2018    NEUT 80 09/04/2018    ANC 3.42 09/04/2018    ALC 0.47 09/04/2018    MONA 7 09/04/2018    AMC 0.31 09/04/2018    ABC 0.04 09/04/2018    MCV 95.1 09/04/2018    MCHC 33.2 09/04/2018    MPV 9.6 09/04/2018    RDW 16.7 09/04/2018    and General Chemistry:    Lab Results   Component Value Date    NA 130 09/04/2018    K 4.6 09/04/2018    CL 99 09/04/2018    GAP 10 09/04/2018    BUN 28 09/04/2018    CR 1.63 09/04/2018    GLU 110 09/04/2018    CA 8.6 09/04/2018    ALBUMIN 3.8 09/04/2018    OBSCA 0.96 05/13/2013    MG 2.1 11/13/2015    TOTBILI  1.0 09/04/2018     Glucose: (!) 110 (09/04/18 0951)  Hemoccult: (S) Negative (09/04/18 1008)    Pertinent radiology reviewed.    Dorisann Frames, MD  Neurology Resident

## 2018-09-04 NOTE — Progress Notes
RT Adult Assessment Note    NAME:Logan Bright             MRN: [id number]             DOB:12/18/1935          AGE: 83 y.o.  ADMISSION DATE: 09/04/2018             DAYS ADMITTED: LOS: 0 days    RT Treatment Plan:       Protocol Plan: Procedures  PAP: Place a nursing order for "IS Q1h While Awake" for any of Lung Expansion indicators(IS w/ nursing)    Additional Comments:  Impressions of the patient: Pt sitting up in bed on RA, non-labored at this time; criteria not met; IS to be done with nursing   Intervention(s)/outcome(s): RT eval   Patient education that was completed: n/a  Recommendations to the care team: n/a    Vital Signs:  Pulse: 68  RR: 20 PER MINUTE  SpO2: 97 %  O2 Device:    Liter Flow:    O2%: 21 %  Breath Sounds: Clear (Implies normal)  Respiratory Effort: Non-Labored

## 2018-09-05 LAB — LIPID PROFILE: Lab: 88 mg/dL — ABNORMAL LOW (ref <200)

## 2018-09-05 LAB — CULTURE-URINE W/SENSITIVITY: Lab: <10

## 2018-09-05 LAB — COMPREHENSIVE METABOLIC PANEL
Lab: 101 MMOL/L — ABNORMAL LOW (ref 98–110)
Lab: 136 MMOL/L — ABNORMAL LOW (ref <100)

## 2018-09-05 LAB — CBC: Lab: 3.9 K/UL — ABNORMAL LOW (ref >40)

## 2018-09-05 LAB — MAGNESIUM: Lab: 1.8 mg/dL — ABNORMAL LOW (ref 1.6–2.6)

## 2018-09-05 NOTE — Progress Notes
24 hour urine collection started at 1900 on 8/7.

## 2018-09-06 DIAGNOSIS — I5023 Acute on chronic systolic (congestive) heart failure: Secondary | ICD-10-CM

## 2018-09-06 LAB — COMPREHENSIVE METABOLIC PANEL
Lab: 136 MMOL/L — ABNORMAL LOW (ref 137–147)
Lab: 0.9 mg/dL (ref 0.3–1.2)
Lab: 1.7 mg/dL — ABNORMAL HIGH (ref 0.4–1.24)
Lab: 11 K/UL (ref 3–12)
Lab: 133 MMOL/L — ABNORMAL LOW (ref 137–147)
Lab: 148 mg/dL — ABNORMAL HIGH (ref 70–100)
Lab: 24 MMOL/L (ref 21–30)
Lab: 3.1 g/dL — ABNORMAL LOW (ref 3.5–5.0)
Lab: 33 mg/dL — ABNORMAL HIGH (ref 7–25)
Lab: 39 mL/min — ABNORMAL LOW (ref >60)
Lab: 47 mL/min — ABNORMAL LOW (ref >60)
Lab: 5.6 g/dL — ABNORMAL LOW (ref 6.0–8.0)
Lab: 7.6 mg/dL — ABNORMAL LOW (ref 8.5–10.6)
Lab: 96 U/L — ABNORMAL LOW (ref 25–110)

## 2018-09-06 LAB — TROPONIN-I: Lab: 0 ng/mL (ref 0.0–0.05)

## 2018-09-06 LAB — CBC AND DIFF
Lab: 0 10*3/uL (ref 0–0.20)
Lab: 0 10*3/uL (ref 0–0.45)
Lab: 3.8 10*3/uL — ABNORMAL LOW (ref 4.5–11.0)

## 2018-09-06 LAB — MAGNESIUM: Lab: 1.7 mg/dL — ABNORMAL LOW (ref 1.6–2.6)

## 2018-09-06 LAB — LACTIC ACID (BG - RAPID LACTATE): Lab: 2.2 MMOL/L — ABNORMAL HIGH (ref 0.5–2.0)

## 2018-09-06 LAB — CBC: Lab: 3.6 K/UL — ABNORMAL LOW (ref 4.5–11.0)

## 2018-09-06 LAB — BNP (B-TYPE NATRIURETIC PEPTI): Lab: 125 pg/mL — ABNORMAL HIGH (ref 0–100)

## 2018-09-06 LAB — URINE COLLECTION
Lab: 24
Lab: 390 mL

## 2018-09-06 MED ORDER — MAGNESIUM SULFATE IN D5W 1 GRAM/100 ML IV PGBK
1 g | INTRAVENOUS | 0 refills | Status: CP
Start: 2018-09-06 — End: ?
  Administered 2018-09-06 (×3): 1 g via INTRAVENOUS

## 2018-09-06 MED ORDER — CARVEDILOL 6.25 MG PO TAB
6.25 mg | Freq: Two times a day (BID) | ORAL | 0 refills | Status: DC
Start: 2018-09-06 — End: 2018-09-07
  Administered 2018-09-06: 23:00:00 6.25 mg via ORAL

## 2018-09-06 MED ORDER — POTASSIUM CHLORIDE 20 MEQ PO TBTQ
40 meq | Freq: Once | ORAL | 0 refills | Status: CP
Start: 2018-09-06 — End: ?
  Administered 2018-09-06: 14:00:00 40 meq via ORAL

## 2018-09-06 MED ADMIN — SODIUM CHLORIDE 0.9 % IV SOLP [27838]: 250 mL | INTRAVENOUS | @ 14:00:00 | Stop: 2018-09-06 | NDC 00338004902

## 2018-09-06 NOTE — Progress Notes
General Progress Note    Name:  Logan Bright   Today's Date:  09/06/2018  Admission Date: 09/04/2018  LOS: 0 days                     Assessment/Plan:    Principal Problem:    Acute on chronic systolic heart failure (HCC)  Active Problems:    Ischemic cardiomyopathy    HTN (hypertension)    Dyslipidemia    CKD (chronic kidney disease)    Automatic implantable cardioverter-defibrillator in situ    Recurrent ventricular tachycardia w/ 2 VT ablations 04/2013    Paroxysmal atrial fibrillation (HCC)    Acute on chronic systolic congestive heart failure (HCC)    Hyponatremia    Hypothyroidism    ???  Logan Bright is a 83 yo M with a PMHx of HTN, HLD, CAD, Ischemic cardiomyopathy, HFrEF, Hx of VT s/p ICD, Afib, CKD, neurologic cardiac syncope, and cryptogenic stroke who presents to the ED with dyspnea an fatigue and admitted for heart failure exacerbation and left-sided weakness.    Hypotension  Acute hypoxic respiratory failure, tachypnea  - concern for unstable bradycardia d/t pacemaker setting change vs hypovolemia d/t diuresis vs sepsis (possible pulmonary source?) vs iatrogenic (coreg increase) vs cardiogenic shock vs unlikely PE with eliquis use  - CXR:1. Mild cardiomegaly with increase in pulmonary venous congestion   suggesting CHF/fluid overload. 2. Persistent right pleural effusion with increase in adjacent consolidation, which may represent atelectasis and/or pneumonia.  - BNP 1256, improved from admission, troponin negative  - SIRS criteria (RR 30 - now improved, lymphopenia)  - no symptoms to suggest infection including PNA, denies pleuritis or cough and no recent concern for aspirations  - ambulating well with PT after episode.   - lactate 2.2 after 250 mL of NS, no gap acidosis   -Discussed with EP.  Telemetry consistent with possible underlying AV block with concern for exacerbation of bradycardia due to [employer] specific programming and recent setting change.  EP evaluating and potentially will change settings back to PTA programming  PLAN  > hold on further fluid boluses with HFrEF and improvement in BP  > hold further diuresis  > appreciate EP and cardiology assistance with evaluating pacemaker  > reduce coreg back to PTA dose  > low threshold for addition of abx for possible HAP/aspiration PNA if progressive hypoxia, fever, or development of symptoms  > Incentive spirometry     Acute on chronic combined systolic and diastolic heart failure, EF 20%  Right ventricular failure   Ischemic cardiomyopathy  Ventricular tachycardia s/p ICD  CAD  - Most recent echo in 2015 with EF of 20% although had Regadenoson MPI on 04/14/18 which showed EF of 18% with extensive areas of prior ischemia but unchanged from prior study\  - ECHO 8/7: EF 20% w/ severe dilatation, new RV failure (severely reduced EF w/severe dilatation), Severely elevated CVP 10-20, Grade II diastolic dysfxn.   - S/p two VT ablations and placement of ICD  - Most recent device interrogation 8/7 with change in settings to reduce RV pacing (100% of the time prior to this)   - Troponin negative, BNP 1,849 on admission --> 1256   - CXR with findings of fluid overload  - PTA regimen: ASA, Eliquis, Coreg 6.25 mg BID, lasix 40 mg, losartan 25 mg BID  Plan:  > Continue PTA ASA, losartan  > reduce Coreg back to 6.25 mg BID with hypotension and bradycardia  > continue  appropriately reduced dose of eliqius  > K/L FLC serum and urine immunofixation ordered to assess for amyloidosis - pending   > TC???PYP ordered to evaluate for amyloidosis given increased IVS and low voltage EKG - will not be completed until 8/10  > Consult Heart Failure team  > consulted EP per cardiology recommendations, appreciate assistance - considering changing pacemaker mode to previous setting vs upgrading to CRT-D   ???  Left-sided weakness - resolved  Cryptogenic stroke  - Patient reports being told he had a stroke by an Ophthalmologist but has never had symptoms - Unaware of left-sided weakness noted on exam  - No neck pain or numbness/tingling  - CT head without hemorrhage or evidence of stroke  Plan:  > Consult Neurology, appreciate recommendations???no further work-up unless symptoms were to recur  ???  Hyponatremia - improving  - Na 130 on admission  - Likely hypervolemic hyponatremia from CHF, improving w/diuresis  Plan:  > Diuresis  ???  Atrial fibrillation  - CHADS2Vasc of at least 4  - PTA regimen: Eliquis and Coreg  Plan:   > Continue Eliquis at reduced dose with age, Cr   > Continue increased coreg to 12.5 mg BID given hypertension  ???  CKD stage III  - Baseline Cr ~ 1.6, at baseline on admission  Plan:  > Monitor  ???  HTN  HLD  > Increase coreg to 12.5 mg BID for hypertension  > Continue PTA losartan  ???  ???  FEN: No IVF, replace lytes PRN, cardiac diet  Ppx: Eliquis  Code status: DNAR-FI  Dispo: Admit to medicine    Bayard Hugger, MD  Internal Medicine   Available on [facility] and [facility].  Pager [phone]    ________________________________________________________________________    Subjective  Logan Bright is a 83 y.o. male.  Patient reports he feels well today.  He notes no improvement in his lower extremity edema.  Does not report orthopnea or PND today.  Denies lightheadedness and currently sitting eating breakfast with good appetite.  Patient feels the swelling is better.  Later in the morning, patient was noted to have bradycardia on telemetry.  Patient was having heart rates as low as 45 despite his pacemaker.  This was accompanied by blood pressures that were 80/40-50.  Patient denied symptoms, but seemed slightly out of it.  He was alert and oriented x 3 though and answered all questions appropriately and able to follow commands well.  He had no other infectious symptoms at the time.  Denied shortness of breath though patient was notably tachypneic into the 30s.  He did not appear to have significant respiratory distress however patient was satting in the low 90s and 2 L reports was placed on him.  Patient was bolused with 250 mils of NS.  His case was also discussed with cardiology due to concern for pacemaker dysfunction.  Cardiology discussed case with EP and he was planning to see him.  There is concerned that patient might have entered an underlying AV block rhythm with his current programming unable to fully compensate due to a certain type of programming.  EP cardiology unclear if this was the complete cause of his symptoms.  He has no pleuritic chest pain, cough, sputum production or evidence of aspiration to suggest an ongoing pneumonia.  Denies other symptoms including dysuria, fever chills.  Patient's blood pressure improved with small amount of IVF.  Patient later after this event seemed to be walking with physical therapy without any  evidence of lightheadedness or residual symptoms.  Has no other complaints.  Agreeable to plan as discussed as above.  He had no further questions.    Medications  Scheduled Meds:allopurinoL (ZYLOPRIM) tablet 100 mg, 100 mg, Oral, QHS  amiodarone (CORDARONE) tablet 200 mg, 200 mg, Oral, QDAY  apixaban (ELIQUIS) tablet 2.5 mg, 2.5 mg, Oral, BID  aspirin EC tablet 81 mg, 81 mg, Oral, QDAY  carvediloL (COREG) tablet 6.25 mg, 6.25 mg, Oral, BID w/meals  furosemide (LASIX) injection 40 mg, 40 mg, Intravenous, BID(9-17)  levothyroxine (SYNTHROID) tablet 50 mcg, 50 mcg, Oral, QDAY 30 min before breakfast  losartan (COZAAR) tablet 25 mg, 25 mg, Oral, BID    Continuous Infusions:  PRN and Respiratory Meds:acetaminophen Q6H PRN, polyethylene glycol 3350 QDAY PRN, senna/docusate BID PRN      Review of Systems:  All other systems reviewed and are negative.    Objective:                          Vital Signs: Last Filed                 Vital Signs: 24 Hour Range   BP: 104/65 (08/09 1417)  Temp: 36.3 ???C (97.4 ???F) (08/09 1155)  Pulse: 63 (08/09 0341)  Respirations: 30 PER MINUTE (08/09 1155) SpO2: 96 % (08/09 1417) BP: (80-123)/(45-77)   Temp:  [36.3 ???C (97.3 ???F)-36.4 ???C (97.5 ???F)]   Pulse:  [63-64]   Respirations:  [16 PER MINUTE-36 PER MINUTE]   SpO2:  [92 %-98 %]      Vitals:    09/04/18 1651 09/05/18 0700 09/06/18 0341   Weight: 97.5 kg (215 lb) 92.4 kg (203 lb 12.8 oz) 88.7 kg (195 lb 8.8 oz)       Intake/Output Summary:  (Last 24 hours)    Intake/Output Summary (Last 24 hours) at 09/06/2018 1545  Last data filed at 09/06/2018 0400  Gross per 24 hour   Intake 340 ml   Output 1200 ml   Net -860 ml      Stool Occurrence: 1    Physical Exam  General appearance: alert, well-developed, well-nourished, cooperative and no distress  Head: Normocephalic, without obvious abnormality, atraumatic  Eyes: negative findings: conjunctivae and sclerae normal, corneas clear and pupils equal, round, reactive to light and accomodation  Throat: Lips, mucosa, and tongue normal. Teeth and gums normal  Neck: supple, symmetrical, trachea midline, no adenopathy no JVD noted  Lungs: Crackles in bases but otherwise clear to auscultation bilaterally  Chest wall: ICD noted in left upper chest  Heart: irregular bradycardia, S1, S2 normal, no murmur  Abdomen: soft, non-tender. Bowel sounds normal. No masses,  no organomegaly  Extremities: edema 2+ improved pitting bilateral lower extremities to knee   Neurologic: A&Ox4. CN's II-XII intact.      Lab Review   24-hour labs:    Results for orders placed or performed during the hospital encounter of 09/04/18 (from the past 24 hour(s))   URINE COLLECTION    Collection Time: 09/05/18  7:00 PM   Result Value Ref Range    Collection Period, Urine 24.0     Volume, Urine 3,900 MLS   CBC    Collection Time: 09/06/18  5:57 AM   Result Value Ref Range    White Blood Cells 3.6 (L) 4.5 - 11.0 K/UL    RBC 4.94 4.4 - 5.5 M/UL    Hemoglobin 15.7 13.5 - 16.5 GM/DL  Hematocrit 46.9 40 - 50 %    MCV 95.0 80 - 100 FL    MCH 31.8 26 - 34 PG    MCHC 33.5 32.0 - 36.0 G/DL    RDW 16.1 (H) 11 - 15 % Platelet Count 95 (L) 150 - 400 K/UL    MPV 9.1 7 - 11 FL   COMPREHENSIVE METABOLIC PANEL    Collection Time: 09/06/18  5:57 AM   Result Value Ref Range    Sodium 136 (L) 137 - 147 MMOL/L    Potassium 3.6 3.5 - 5.1 MMOL/L    Chloride 98 98 - 110 MMOL/L    Glucose 85 70 - 100 MG/DL    Blood Urea Nitrogen 32 (H) 7 - 25 MG/DL    Creatinine 0.96 (H) 0.4 - 1.24 MG/DL    Calcium 7.7 (L) 8.5 - 10.6 MG/DL    Total Protein 5.4 (L) 6.0 - 8.0 G/DL    Total Bilirubin 0.8 0.3 - 1.2 MG/DL    Albumin 3.0 (L) 3.5 - 5.0 G/DL    Alk Phosphatase 93 25 - 110 U/L    AST (SGOT) 19 7 - 40 U/L    CO2 27 21 - 30 MMOL/L    ALT (SGPT) 22 7 - 56 U/L    Anion Gap 11 3 - 12    eGFR Non African American 37 (L) >60 mL/min    eGFR African American 45 (L) >60 mL/min   MAGNESIUM    Collection Time: 09/06/18  5:57 AM   Result Value Ref Range    Magnesium 1.7 1.6 - 2.6 mg/dL   CBC AND DIFF    Collection Time: 09/06/18 12:28 PM   Result Value Ref Range    White Blood Cells 3.8 (L) 4.5 - 11.0 K/UL    RBC 5.07 4.4 - 5.5 M/UL    Hemoglobin 16.2 13.5 - 16.5 GM/DL    Hematocrit 04.5 40 - 50 %    MCV 95.7 80 - 100 FL    MCH 31.9 26 - 34 PG    MCHC 33.4 32.0 - 36.0 G/DL    RDW 40.9 (H) 11 - 15 %    Platelet Count 91 (L) 150 - 400 K/UL    MPV 9.2 7 - 11 FL    Neutrophils 83 (H) 41 - 77 %    Lymphocytes 7 (L) 24 - 44 %    Monocytes 9 4 - 12 %    Eosinophils 1 0 - 5 %    Basophils 0 0 - 2 %    Absolute Neutrophil Count 3.12 1.8 - 7.0 K/UL    Absolute Lymph Count 0.28 (L) 1.0 - 4.8 K/UL    Absolute Monocyte Count 0.32 0 - 0.80 K/UL    Absolute Eosinophil Count 0.03 0 - 0.45 K/UL    Absolute Basophil Count 0.01 0 - 0.20 K/UL   COMPREHENSIVE METABOLIC PANEL    Collection Time: 09/06/18 12:28 PM   Result Value Ref Range    Sodium 133 (L) 137 - 147 MMOL/L    Potassium 4.2 3.5 - 5.1 MMOL/L    Chloride 98 98 - 110 MMOL/L    Glucose 148 (H) 70 - 100 MG/DL    Blood Urea Nitrogen 33 (H) 7 - 25 MG/DL    Creatinine 8.11 (H) 0.4 - 1.24 MG/DL Calcium 7.6 (L) 8.5 - 91.4 MG/DL    Total Protein 5.6 (L) 6.0 - 8.0 G/DL    Total Bilirubin 0.9 0.3 - 1.2  MG/DL    Albumin 3.1 (L) 3.5 - 5.0 G/DL    Alk Phosphatase 96 25 - 110 U/L    AST (SGOT) 22 7 - 40 U/L    CO2 24 21 - 30 MMOL/L    ALT (SGPT) 22 7 - 56 U/L    Anion Gap 11 3 - 12    eGFR Non African American 39 (L) >60 mL/min    eGFR African American 47 (L) >60 mL/min   LACTIC ACID (BG - RAPID LACTATE)    Collection Time: 09/06/18 12:28 PM   Result Value Ref Range    Lactic Acid,BG 2.2 (H) 0.5 - 2.0 MMOL/L   BNP (B-TYPE NATRIURETIC PEPTI)    Collection Time: 09/06/18 12:28 PM   Result Value Ref Range    B Type Natriuretic Peptide 1,256.0 (H) 0 - 100 PG/ML   TROPONIN-I    Collection Time: 09/06/18 12:28 PM   Result Value Ref Range    Troponin-I 0.01 0.0 - 0.05 NG/ML       Point of Care Testing  (Last 24 hours)  Glucose: (!) 148 (09/06/18 1228)    Radiology and other Diagnostics Review:    Pertinent radiology reviewed.    Kerney Elbe, MD   Pager

## 2018-09-06 NOTE — Progress Notes
General Progress Note    Name:  Logan Bright   Today's Date:  09/05/2018  Admission Date: 09/04/2018  LOS: 0 days                     Assessment/Plan:    Principal Problem:    Acute on chronic systolic heart failure (HCC)  Active Problems:    Ischemic cardiomyopathy    HTN (hypertension)    Dyslipidemia    CKD (chronic kidney disease)    Automatic implantable cardioverter-defibrillator in situ    Recurrent ventricular tachycardia w/ 2 VT ablations 04/2013    Paroxysmal atrial fibrillation (HCC)    Acute on chronic systolic congestive heart failure (HCC)    Hyponatremia    Hypothyroidism    ???  Mehkai Gallo is a 83 yo M with a PMHx of HTN, HLD, CAD, Ischemic cardiomyopathy, HFrEF, Hx of VT s/p ICD, Afib, CKD, neurologic cardiac syncope, and cryptogenic stroke who presents to the ED with dyspnea an fatigue and admitted for heart failure exacerbation and left-sided weakness.  ???  Acute on chronic combined systolic and diastolic heart failure, EF 20%  RV failure  Ischemic cardiomyopathy  Ventricular tachycardia s/p ICD  CAD  - Most recent echo in 2015 with EF of 20% although had Regadenoson MPI on 04/14/18 which showed EF of 18% with extensive areas of prior ischemia but unchanged from prior study\  - ECHO 8/7: EF 20% w/ severe dilatation, new RV failure (severely reduced EF w/severe dilatation), Severely elevated CVP 10-20, Grade II diastolic dysfxn.   - S/p two VT ablations and placement of ICD  - Most recent device interrogation on 7/13  - Troponin negative, BNP 1,849 on admission  - CXR with findings of fluid overload  - PTA regimen: ASA, Eliquis, Coreg 6.25 mg BID, lasix 40 mg, losartan 25 mg BID  Plan:  > Diuresis with 40 mg of IV lasix BID, appreciate cardiology recommendations  > Continue PTA ASA, losartan  > continue increased Coreg 12.5 mg BID  > Patient meets criteria for reduced dose of Eliquis (age, Cr > 1.5) however given concern for stroke will defer decision to Cardiology and Neurology to reduce dose given no hemorrhage on CT head  > Device interrogation - s/p reprogramming to reduce RV pacing   > K/L FLC serum and urine immunofixation ordered to assess for amyloidosis - pending   > TC???PYP ordered to evaluate for amyloidosis given increased IVS and low voltage EKG - will not be completed until 8/10  > Consult Heart Failure team  ???  Left-sided weakness - resolved  Cryptogenic stroke  - Patient reports being told he had a stroke by an Ophthalmologist but has never had symptoms  - Unaware of left-sided weakness noted on exam  - No neck pain or numbness/tingling  - CT head without hemorrhage or evidence of stroke  Plan:  > Consult Neurology, appreciate recommendations???no further work-up unless symptoms were to recur  ???  Hyponatremia - improving  - Na 130 on admission  - Likely hypervolemic hyponatremia from CHF, improving w/diuresis  Plan:  > Diuresis  ???  Atrial fibrillation  - CHADS2Vasc of at least 4  - PTA regimen: Eliquis and Coreg  Plan:   > Continue Eliquis at reduced dose with age, Cr   > Continue increased coreg to 12.5 mg BID given hypertension  ???  CKD stage III  - Baseline Cr ~ 1.6, at baseline on admission  Plan:  > Monitor  ???  HTN  HLD  > Increase coreg to 12.5 mg BID for hypertension  > Continue PTA losartan  ???  ???  FEN: No IVF, replace lytes PRN, cardiac diet  Ppx: Eliquis  Code status: DNAR-FI  Dispo: Admit to medicine    Bayard Hugger, MD  Internal Medicine   Available on [facility] and [facility].  Pager [phone]    ________________________________________________________________________    Subjective  Logan Bright is a 83 y.o. male.  Patient reports he feels well today.  He notes no improvement in his lower extremity edema.  Does not report orthopnea or PND today.  Denies lightheadedness and currently sitting eating breakfast with good appetite.  Has no other complaints.  Agreeable to plan as discussed as above.  He had no further questions.    Medications Scheduled Meds:allopurinoL (ZYLOPRIM) tablet 100 mg, 100 mg, Oral, QHS  amiodarone (CORDARONE) tablet 200 mg, 200 mg, Oral, QDAY  apixaban (ELIQUIS) tablet 2.5 mg, 2.5 mg, Oral, BID  aspirin EC tablet 81 mg, 81 mg, Oral, QDAY  carvediloL (COREG) tablet 12.5 mg, 12.5 mg, Oral, BID w/meals  furosemide (LASIX) injection 40 mg, 40 mg, Intravenous, BID(9-17)  levothyroxine (SYNTHROID) tablet 50 mcg, 50 mcg, Oral, QDAY 30 min before breakfast  losartan (COZAAR) tablet 25 mg, 25 mg, Oral, BID    Continuous Infusions:  PRN and Respiratory Meds:acetaminophen Q6H PRN, polyethylene glycol 3350 QDAY PRN, senna/docusate BID PRN      Review of Systems:  All other systems reviewed and are negative.    Objective:                          Vital Signs: Last Filed                 Vital Signs: 24 Hour Range   BP: 116/63 (08/08 1559)  Temp: 36.4 ???C (97.5 ???F) (08/08 1559)  Pulse: 64 (08/08 1559)  Respirations: 16 PER MINUTE (08/08 1559)  SpO2: 98 % (08/08 1559) BP: (112-120)/(60-78)   Temp:  [36.3 ???C (97.4 ???F)-36.6 ???C (97.8 ???F)]   Pulse:  [59-72]   Respirations:  [16 PER MINUTE-18 PER MINUTE]   SpO2:  [92 %-98 %]      Vitals:    09/04/18 0925 09/04/18 1651 09/05/18 0700   Weight: 97.5 kg (215 lb) 97.5 kg (215 lb) 92.4 kg (203 lb 12.8 oz)       Intake/Output Summary:  (Last 24 hours)    Intake/Output Summary (Last 24 hours) at 09/05/2018 1911  Last data filed at 09/05/2018 1857  Gross per 24 hour   Intake 200 ml   Output 2480 ml   Net -2280 ml      Stool Occurrence: 0    Physical Exam  General appearance: alert, well-developed, well-nourished, cooperative and no distress  Head: Normocephalic, without obvious abnormality, atraumatic  Eyes: negative findings: conjunctivae and sclerae normal, corneas clear and pupils equal, round, reactive to light and accomodation  Throat: Lips, mucosa, and tongue normal. Teeth and gums normal  Neck: supple, symmetrical, trachea midline, no adenopathy and JVD with positive HJR Lungs: Crackles in bases but otherwise clear to auscultation bilaterally  Chest wall: ICD noted in left upper chest  Heart: regular rate and rhythm, S1, S2 normal, no murmur, click, rub or gallop  Abdomen: soft, non-tender. Bowel sounds normal. No masses,  no organomegaly  Extremities: edema 2+ tight pitting bilateral lower extremities to mid-thigh    Neurologic: A&Ox4. CN's II-XII intact.  No hyperreflexia noted. No weakness noted  Peripheral pulses:  2+ and symmetric    Lab Review   24-hour labs:    Results for orders placed or performed during the hospital encounter of 09/04/18 (from the past 24 hour(s))   CBC    Collection Time: 09/05/18  5:57 AM   Result Value Ref Range    White Blood Cells 3.9 (L) 4.5 - 11.0 K/UL    RBC 5.09 4.4 - 5.5 M/UL    Hemoglobin 16.5 13.5 - 16.5 GM/DL    Hematocrit 16.1 40 - 50 %    MCV 95.0 80 - 100 FL    MCH 32.5 26 - 34 PG    MCHC 34.2 32.0 - 36.0 G/DL    RDW 09.6 (H) 11 - 15 %    Platelet Count 93 (L) 150 - 400 K/UL    MPV 9.2 7 - 11 FL   COMPREHENSIVE METABOLIC PANEL    Collection Time: 09/05/18  5:57 AM   Result Value Ref Range    Sodium 136 (L) 137 - 147 MMOL/L    Potassium 4.7 3.5 - 5.1 MMOL/L    Chloride 101 98 - 110 MMOL/L    Glucose 83 70 - 100 MG/DL    Blood Urea Nitrogen 30 (H) 7 - 25 MG/DL    Creatinine 0.45 (H) 0.4 - 1.24 MG/DL    Calcium 8.0 (L) 8.5 - 10.6 MG/DL    Total Protein 5.5 (L) 6.0 - 8.0 G/DL    Total Bilirubin 0.7 0.3 - 1.2 MG/DL    Albumin 3.1 (L) 3.5 - 5.0 G/DL    Alk Phosphatase 409 25 - 110 U/L    AST (SGOT) 20 7 - 40 U/L    CO2 23 21 - 30 MMOL/L    ALT (SGPT) 21 7 - 56 U/L    Anion Gap 12 3 - 12    eGFR Non African American 38 (L) >60 mL/min    eGFR African American 45 (L) >60 mL/min   MAGNESIUM    Collection Time: 09/05/18  5:57 AM   Result Value Ref Range    Magnesium 1.8 1.6 - 2.6 mg/dL   LIPID PROFILE    Collection Time: 09/05/18  5:57 AM   Result Value Ref Range    Cholesterol 88 <200 MG/DL    Triglycerides 80 <811 MG/DL    HDL 31 (L) >91 MG/DL LDL 48 <478 mg/dL    VLDL 16 MG/DL    Non HDL Cholesterol 57 MG/DL       Point of Care Testing  (Last 24 hours)  Glucose: 83 (09/05/18 0557)    Radiology and other Diagnostics Review:    Pertinent radiology reviewed.    Kerney Elbe, MD   Pager

## 2018-09-06 NOTE — Progress Notes
Heart Failure Nursing Progress Note    Admission Date: 09/04/2018  LOS: 0 days    Admission Weight: 97.5 kg (215 lb)        Most recent weights (inpatient):   Vitals:    09/04/18 1651 09/05/18 0700 09/06/18 0341   Weight: 97.5 kg (215 lb) 92.4 kg (203 lb 12.8 oz) 88.7 kg (195 lb 8.8 oz)     Weight change from previous day:-3.7kg    Fluid restriction ordered: 1500 ml    Intake/Output Summary: (Last 24 hours)    Intake/Output Summary (Last 24 hours) at 09/06/2018 0650  Last data filed at 09/06/2018 0400  Gross per 24 hour   Intake 340 ml   Output 1880 ml   Net -1540 ml       Is patient incontinent No    Anticipated discharge date: unknown  Discharge goals: ongoing      Daily Assessment of Patient Stated Goals:    Short Term Goal Identified by patient (Short Term=during hospitalization):  N/A

## 2018-09-06 NOTE — Progress Notes
Heart Failure Nursing Progress Note    Admission Date: 09/04/2018  LOS: 0 days    Admission Weight: 97.5 kg (215 lb)        Most recent weights (inpatient):   Vitals:    09/04/18 1651 09/05/18 0700 09/06/18 0341   Weight: 97.5 kg (215 lb) 92.4 kg (203 lb 12.8 oz) 88.7 kg (195 lb 8.8 oz)     Weight change from previous day: - 4 kg    Fluid restriction ordered: 1500 ml    Intake/Output Summary: (Last 24 hours)    Intake/Output Summary (Last 24 hours) at 09/06/2018 1852  Last data filed at 09/06/2018 1800  Gross per 24 hour   Intake 740 ml   Output 1200 ml   Net -460 ml       Is patient incontinent No    Anticipated discharge date: 09/08/18  Discharge goals: complete cardiac amyloidosis scan, hemodynamically stable      Daily Assessment of Patient Stated Goals:    Short Term Goal Identified by patient (Short Term=during hospitalization): none identified

## 2018-09-06 NOTE — Progress Notes
Received call from [facility] regarding heart rate change to 48. Noted rhythm change from atrial paced to intermittent AV pacing. Pt resting in bed, awake, A&Ox4, denies chest pain, SOA, lightheadedness, dizziness. BP 80s/40s, O2 91% on RA and RR 36. Placed on 2L O2 via NC with increase in SpO2 to 97%. Dr. Allean Found at bedside, ordered 250 ml NS bolus from bag. Bolus given. BP up to 94/59. Will continue to monitor.

## 2018-09-06 NOTE — Progress Notes
PHYSICAL THERAPY  ASSESSMENT      Name: Logan Bright        MRN: [id number]          DOB: [birth year]          Age: 83 y.o.  Admission Date: 09/04/2018             LOS: 0 days      Mobility  Patient Turn/Position: Self  Progressive Mobility Level: Walk in hallway  Distance Walked (feet): 180 ft  Level of Assistance: Stand by assistance  Assistive Device: None  Time Tolerated: 11-30 minutes    Subjective  Significant hospital events: PMHx of HTN, HLD, CAD, Ischemic cardiomyopathy, HFrEF, Hx of VT s/p ICD, Afib, CKD, neurologic cardiac syncope, and cryptogenic stroke who presents to the ED with dyspnea an fatigue and admitted for heart failure exacerbation and left-sided weakness.  Mental / Cognitive Status: Alert;Oriented;Cooperative  Pain: Patient has no complaint of pain  Pain Interventions: Patient agrees to participate in therapy  Comments: Patient sitting up at edge of bed, states he is doing well today and agrees to get up with PT.  Patient Owned Equipment: Education officer, community [employer] Situation: Lives Alone  Type of Home: House  Entry Stairs: 1-2 Stairs  In-Home Stairs: Able to Live on One Level    ROM  LE ROM: Bilateral;WFL    Strength  Overall Strength: WFL    Posture/Neurological  Overall Sensation/Proprioception: No Deficits Noted    Bed Mobility/Transfer  Bed Mobility: Supine to Sit: Modified Independent  Transfer Type: Sit to/from Stand  Transfer: Assistance Level: To/From;Bed;Standby Assist  Transfer: Assistive Device: None  Transfers: Type Of Assistance: Verbal Cues  End Of Activity Status: Sitting at [place] of Bed;Instructed Patient to Use Call Light    Balance  Sitting Balance: Static Sitting Balance;Independent  Standing Balance: Static Standing Balance;Standby Assist    Gait  Gait Distance: 180 feet  Gait: Assistance Level: Standby Assist  Gait: Assistive Device: None  Gait: Descriptors: Pace: Futures trader;No balance loss;Variable step length    Education  Persons Educated: Patient Patient Barriers To Learning: None Noted  Teaching Methods: Verbal Instruction  Patient Response: Verbalized Understanding  Topics: Plan/Goals of PT Interventions;Mobility Progression;Exercise Program;Safety Awareness;Importance of Increasing Activity;Ambulate With Nursing    Assessment/Progress  Impaired Mobility Due To: Medical Status Limitation  Assessment/Progress: Expect Good Progress  Comments: Patient tolerates therapy well this afternoon, seems to be moving fairly well.  Is going to benefit from increased frequency of activity to improve strength and stamina prior to discharge.    AM-PAC 6 Clicks Basic Mobility Inpatient  Turning from your back to your side while in a flat bed without using bed rails: None  Moving from lying on your back to sitting on the side of a flatbed without using bedrails : None  Moving to and from a bed to a chair (including a wheelchair): None  Standing up from a chair using your arms (e.g. wheelchair, or bedside chair): None  To walk in hospital room: A Little  Climbing 3-5 steps with a railing: A Little  Raw Score: 22  Standardized (T-scale) Score: 47.4  Basic Mobility CMS 0-100%: 25.02  CMS G Code Modifier for Basic Mobility: CJ    Goals  Goal Formulation: With Patient  Time For Goal Achievement: 5 days  Patient Will Go Supine To/From Sit: Independently  Patient Will Transfer Sit to Stand: Independently  Patient Will Ambulate: Greater than 200 Feet, w/ No Device, Independently  Patient Will Go Up / Down Stairs: 1-2 Stairs, Independently    Plan  Treatment Interventions: Mobility Training  Plan Frequency: 5 Days per Week  PT Plan for Next Visit: If patient doing well, could progress with mobility aide as well.  Increase ambulation distance and work on stairs next visit.    PT Discharge Recommendations  Recommendation: Home with intermittent supervision/assistance  Recommendation for Therapy Post Discharge: Home health    Therapist  Laray Anger, PT, DPT  Date  09/06/2018

## 2018-09-06 NOTE — Progress Notes
I have reviewed the notes, assessments, and/or procedures performed by Mitch Arndt, RN, and concur with his documentation unless otherwise noted.

## 2018-09-07 ENCOUNTER — Encounter: Admit: 2018-09-07 | Discharge: 2018-09-07 | Primary: Cardiovascular Disease

## 2018-09-07 DIAGNOSIS — Z8679 Personal history of other diseases of the circulatory system: Secondary | ICD-10-CM

## 2018-09-07 DIAGNOSIS — I10 Essential (primary) hypertension: Secondary | ICD-10-CM

## 2018-09-07 DIAGNOSIS — R55 Syncope and collapse: Secondary | ICD-10-CM

## 2018-09-07 DIAGNOSIS — N289 Disorder of kidney and ureter, unspecified: Secondary | ICD-10-CM

## 2018-09-07 DIAGNOSIS — T148XXA Other injury of unspecified body region, initial encounter: Secondary | ICD-10-CM

## 2018-09-07 DIAGNOSIS — I5023 Acute on chronic systolic (congestive) heart failure: Principal | ICD-10-CM

## 2018-09-07 DIAGNOSIS — I255 Ischemic cardiomyopathy: Secondary | ICD-10-CM

## 2018-09-07 DIAGNOSIS — E785 Hyperlipidemia, unspecified: Secondary | ICD-10-CM

## 2018-09-07 DIAGNOSIS — Z955 Presence of coronary angioplasty implant and graft: Secondary | ICD-10-CM

## 2018-09-07 DIAGNOSIS — M1990 Unspecified osteoarthritis, unspecified site: Secondary | ICD-10-CM

## 2018-09-07 DIAGNOSIS — I2510 Atherosclerotic heart disease of native coronary artery without angina pectoris: Secondary | ICD-10-CM

## 2018-09-07 DIAGNOSIS — Z95810 Presence of automatic (implantable) cardiac defibrillator: Secondary | ICD-10-CM

## 2018-09-07 LAB — URINALYSIS DIPSTICK REFLEX TO CULTURE
Lab: NEGATIVE
Lab: NEGATIVE
Lab: NEGATIVE
Lab: NEGATIVE

## 2018-09-07 LAB — URINALYSIS MICROSCOPIC REFLEX TO CULTURE

## 2018-09-07 LAB — IMMUNOFIXATION, SERUM (IFES)

## 2018-09-07 LAB — CBC: Lab: 3.6 10*3/uL — ABNORMAL LOW (ref 4.5–11.0)

## 2018-09-07 LAB — COMPREHENSIVE METABOLIC PANEL: Lab: 135 MMOL/L — ABNORMAL LOW (ref 137–147)

## 2018-09-07 LAB — MAGNESIUM: Lab: 2.3 mg/dL (ref 1.6–2.6)

## 2018-09-07 LAB — KAPPA/LAMBDA FREE LIGHT CHAINS: Lab: 4.8 mg/dL — ABNORMAL HIGH (ref 0.33–1.94)

## 2018-09-07 MED ORDER — LOSARTAN 25 MG PO TAB
12.5 mg | Freq: Two times a day (BID) | ORAL | 0 refills | Status: DC
Start: 2018-09-07 — End: 2018-09-10
  Administered 2018-09-08 – 2018-09-10 (×6): 12.5 mg via ORAL

## 2018-09-07 MED ORDER — CARVEDILOL 3.125 MG PO TAB
3.125 mg | Freq: Two times a day (BID) | ORAL | 0 refills | Status: DC
Start: 2018-09-07 — End: 2018-09-10
  Administered 2018-09-07 – 2018-09-09 (×5): 3.125 mg via ORAL

## 2018-09-07 MED ORDER — POTASSIUM CHLORIDE 20 MEQ PO TBTQ
20 meq | Freq: Every day | ORAL | 0 refills | Status: DC
Start: 2018-09-07 — End: 2018-09-10
  Administered 2018-09-07 – 2018-09-10 (×4): 20 meq via ORAL

## 2018-09-07 MED ORDER — FUROSEMIDE 40 MG PO TAB
40 mg | Freq: Every day | ORAL | 0 refills | Status: DC
Start: 2018-09-07 — End: 2018-09-10
  Administered 2018-09-07 – 2018-09-10 (×4): 40 mg via ORAL

## 2018-09-07 MED ORDER — APIXABAN 2.5 MG PO TAB
2.5 mg | ORAL_TABLET | Freq: Two times a day (BID) | ORAL | 1 refills | Status: CN
Start: 2018-09-07 — End: ?

## 2018-09-07 NOTE — Progress Notes
General Progress Note    Name:  Logan Bright   Today's Date:  09/07/2018  Admission Date: 09/04/2018  LOS: 1 day                     Assessment/Plan:    Principal Problem:    Acute on chronic systolic heart failure (HCC)  Active Problems:    Ischemic cardiomyopathy    HTN (hypertension)    Dyslipidemia    CKD (chronic kidney disease)    Automatic implantable cardioverter-defibrillator in situ    Recurrent ventricular tachycardia w/ 2 VT ablations 04/2013    Paroxysmal atrial fibrillation (HCC)    Acute on chronic systolic congestive heart failure (HCC)    Hyponatremia    Hypothyroidism    ???  Logan Bright is a 83 yo M with a PMHx of HTN, HLD, CAD, Ischemic cardiomyopathy, HFrEF, Hx of VT s/p ICD, Afib, CKD, neurologic cardiac syncope, and cryptogenic stroke who presents to the ED with dyspnea an fatigue and admitted for heart failure exacerbation and left-sided weakness.    Hypotension - resolved  Acute hypoxic respiratory failure, tachypnea - resolved  - concern for unstable bradycardia d/t pacemaker setting change vs hypovolemia d/t diuresis vs sepsis (possible pulmonary source?) vs iatrogenic (coreg increase) vs cardiogenic shock vs unlikely PE with eliquis use  - CXR:1. Mild cardiomegaly with increase in pulmonary venous congestion   suggesting CHF/fluid overload. 2. Persistent right pleural effusion with increase in adjacent consolidation, which may represent atelectasis and/or pneumonia.  - BNP 1256, improved from admission, troponin negative  - SIRS criteria (RR 30 - now improved, lymphopenia)  - no symptoms to suggest infection including PNA, denies pleuritis or cough and no recent concern for aspirations  - ambulating well with PT after episode.   - lactate 2.2 after 250 mL of NS  -Discussed with EP.  Telemetry consistent with possible underlying AV block with concern for exacerbation of bradycardia due to [employer] specific programming and recent setting change.  EP evaluating and potentially will change settings back to PTA programming  PLAN  > avoid further fluid boluses with HFrEF and improvement in BP  > diuresis as below  > appreciate EP and cardiology assistance with pacemaker functioning  > GDMT as below  > Incentive spirometry     Acute on chronic combined systolic and diastolic heart failure, EF 20%  Right ventricular failure   Ischemic cardiomyopathy  Ventricular tachycardia s/p ICD  CAD  - Most recent echo in 2015 with EF of 20% although had Regadenoson MPI on 04/14/18 which showed EF of 18% with extensive areas of prior ischemia but unchanged from prior study\  - ECHO 8/7: EF 20% w/ severe dilatation, new RV failure (severely reduced EF w/severe dilatation), Severely elevated CVP 10-20, Grade II diastolic dysfxn.   - S/p two VT ablations and placement of ICD  - Most recent device interrogation 8/7 with change in settings to reduce RV pacing (100% of the time prior to this)   - Troponin negative, BNP 1,849 on admission --> 1256   - CXR with findings of fluid overload  - PTA regimen: ASA, Eliquis, Coreg 6.25 mg BID, lasix 40 mg, losartan 25 mg BID  Plan:  > Continue PTA ASA, losartan  > reduce Coreg to 3.125 mg BID with hypotension and bradycardia  > continue appropriately reduced dose of eliqius  > K/L FLC serum and urine immunofixation ordered to assess for amyloidosis - pending   > TC???PYP ordered  to evaluate for amyloidosis given increased IVS and low voltage EKG - completed, read pending  > Consult Heart Failure team  > consulted EP per cardiology recommendations, appreciate assistance.  ???  Left-sided weakness - resolved  Cryptogenic stroke  - Patient reports being told he had a stroke by an Ophthalmologist but has never had symptoms  - Unaware of left-sided weakness noted on exam  - No neck pain or numbness/tingling  - CT head without hemorrhage or evidence of stroke  Plan:  > Consulted Neurology, appreciate recommendations???no further work-up unless symptoms were to recur  ??? Hyponatremia - improving  - Na 130 on admission  - Likely hypervolemic hyponatremia from CHF, improving w/diuresis  Plan:  > Diuresis  ???  Atrial fibrillation  - CHADS2Vasc of at least 4  - PTA regimen: Eliquis and Coreg  Plan:   > Continue Eliquis at reduced dose with age, Cr   > Beta blocker as above  ???  CKD stage III  - Baseline Cr ~ 1.6, at baseline on admission  Plan:  > Monitor  ???  HTN  HLD  > Increase coreg to 12.5 mg BID for hypertension  > Continue PTA losartan    Concern for inability to care for himself d/t undiagnosed dementia  -Discussed with patient's DPOA who has significant concern for patient caring for himself at home.  -Lives at home alone without consistent caregiver  -DPOA reports home appears unlivable and has photo documentation, also has concern for ability to take his medications.  -We will continue discussion with patient and recommend discharge to inpatient setting.  PLAN  > KELS, speech cog for evaluation of cognition   > will discuss [facility] Levo and family's concerns with patient further  > will consider psych eval for assistance with capacity evaluation if continues to refuse  > patient interested in possible placement  ???  FEN: No IVF, replace lytes PRN, cardiac diet  Ppx: Eliquis  Code status: DNAR-FI  Dispo: Admit to medicine    Bayard Hugger, MD  Internal Medicine   Available on [facility] and [facility]  Pager [phone]    ________________________________________________________________________    Subjective  Logan Bright is a 83 y.o. male.  Patient again reports he feels well. NAEON. He notes no further episodes like yesterday. Discussed placement and DPOA's concerns. He is interested in potential placement if it was temporary. He wanted to hear more about potential options prior to accepting. He is ok if I have ongoing discussions with [facility] Levo.     Medications  Scheduled Meds:allopurinoL (ZYLOPRIM) tablet 100 mg, 100 mg, Oral, QHS  amiodarone (CORDARONE) tablet 200 mg, 200 mg, Oral, QDAY apixaban (ELIQUIS) tablet 2.5 mg, 2.5 mg, Oral, BID  aspirin EC tablet 81 mg, 81 mg, Oral, QDAY  carvediloL (COREG) tablet 3.125 mg, 3.125 mg, Oral, BID w/meals  furosemide (LASIX) tablet 40 mg, 40 mg, Oral, QDAY  levothyroxine (SYNTHROID) tablet 50 mcg, 50 mcg, Oral, QDAY 30 min before breakfast  losartan (COZAAR) tablet 12.5 mg, 12.5 mg, Oral, BID  potassium chloride SR (K-DUR) tablet 20 mEq, 20 mEq, Oral, QDAY w/breakfast    Continuous Infusions:  PRN and Respiratory Meds:acetaminophen Q6H PRN, polyethylene glycol 3350 QDAY PRN, senna/docusate BID PRN      Review of Systems:  All other systems reviewed and are negative.    Objective:                          Vital Signs: Last  Filed                 Vital Signs: 24 Hour Range   BP: 116/73 (08/10 0746)  Temp: 36.4 ???C (97.5 ???F) (08/10 0746)  Pulse: 60 (08/10 0746)  Respirations: 18 PER MINUTE (08/10 0746)  SpO2: 96 % (08/10 0746) BP: (91-116)/(51-73)   Temp:  [36.3 ???C (97.3 ???F)-36.8 ???C (98.2 ???F)]   Pulse:  [55-64]   Respirations:  [16 PER MINUTE-18 PER MINUTE]   SpO2:  [95 %-100 %]    Intensity Pain Scale (Self Report): Asleep (09/07/18 0400) Vitals:    09/05/18 0700 09/06/18 0341 09/07/18 0557   Weight: 92.4 kg (203 lb 12.8 oz) 88.7 kg (195 lb 8.8 oz) 88.4 kg (194 lb 14.2 oz)       Intake/Output Summary:  (Last 24 hours)    Intake/Output Summary (Last 24 hours) at 09/07/2018 1449  Last data filed at 09/07/2018 1348  Gross per 24 hour   Intake 600 ml   Output 400 ml   Net 200 ml      Stool Occurrence: 1    Physical Exam  General appearance: alert, well-developed, well-nourished, cooperative and no distress  Head: Normocephalic, without obvious abnormality, atraumatic  Eyes: negative findings: conjunctivae and sclerae normal, corneas clear and pupils equal, round, reactive to light and accomodation  Throat: Lips, mucosa, and tongue normal. Teeth and gums normal  Neck: supple, symmetrical, trachea midline, no adenopathy no JVD noted Lungs: Crackles in bases but otherwise clear to auscultation bilaterally  Chest wall: ICD noted in left upper chest  Heart: irregular bradycardia, S1, S2 normal, no murmur  Abdomen: soft, non-tender. Bowel sounds normal. No masses,  no organomegaly  Extremities: edema 1+ improved pitting bilateral lower extremities to knee with compression stockings present  Neurologic: A&Ox4. CN's II-XII intact.      Lab Review   24-hour labs:    Results for orders placed or performed during the hospital encounter of 09/04/18 (from the past 24 hour(s))   URINALYSIS DIPSTICK REFLEX TO CULTURE    Collection Time: 09/06/18  6:26 PM   Result Value Ref Range    Color,UA YELLOW     Turbidity,UA CLEAR CLEAR-CLEAR    Specific Gravity-Urine 1.009 1.003 - 1.035    pH,UA 6.0 5.0 - 8.0    Protein,UA NEG NEG-NEG    Glucose,UA NEG NEG-NEG    Ketones,UA NEG NEG-NEG    Bilirubin,UA NEG NEG-NEG    Blood,UA NEG NEG-NEG    Urobilinogen,UA INCREASED (A) NORM-NORMAL    Nitrite,UA NEG NEG-NEG    Leukocytes,UA NEG NEG-NEG    Urine Ascorbic Acid, UA NEG NEG-NEG   URINALYSIS MICROSCOPIC REFLEX TO CULTURE    Collection Time: 09/06/18  6:26 PM   Result Value Ref Range    WBCs,UA 0-2 0 - 2 /HPF    RBCs,UA 0-2 0 - 3 /HPF    Comment,UA       Criteria for reflex to culture are WBC>10, Positive Nitrite, and/or >=+1   leukocytes. If quantity is not sufficient, an addendum will follow.      MucousUA TRACE    CBC    Collection Time: 09/07/18  6:45 AM   Result Value Ref Range    White Blood Cells 3.6 (L) 4.5 - 11.0 K/UL    RBC 5.08 4.4 - 5.5 M/UL    Hemoglobin 16.4 13.5 - 16.5 GM/DL    Hematocrit 19.1 40 - 50 %    MCV 95.5 80 - 100 FL  MCH 32.3 26 - 34 PG    MCHC 33.9 32.0 - 36.0 G/DL    RDW 45.4 (H) 11 - 15 %    Platelet Count 104 (L) 150 - 400 K/UL    MPV 9.0 7 - 11 FL   COMPREHENSIVE METABOLIC PANEL    Collection Time: 09/07/18  6:45 AM   Result Value Ref Range    Sodium 135 (L) 137 - 147 MMOL/L    Potassium 4.1 3.5 - 5.1 MMOL/L    Chloride 99 98 - 110 MMOL/L Glucose 100 70 - 100 MG/DL    Blood Urea Nitrogen 32 (H) 7 - 25 MG/DL    Creatinine 0.98 (H) 0.4 - 1.24 MG/DL    Calcium 7.8 (L) 8.5 - 10.6 MG/DL    Total Protein 5.7 (L) 6.0 - 8.0 G/DL    Total Bilirubin 0.7 0.3 - 1.2 MG/DL    Albumin 3.2 (L) 3.5 - 5.0 G/DL    Alk Phosphatase 119 25 - 110 U/L    AST (SGOT) 24 7 - 40 U/L    CO2 23 21 - 30 MMOL/L    ALT (SGPT) 23 7 - 56 U/L    Anion Gap 13 (H) 3 - 12    eGFR Non African American 39 (L) >60 mL/min    eGFR African American 47 (L) >60 mL/min   MAGNESIUM    Collection Time: 09/07/18  6:45 AM   Result Value Ref Range    Magnesium 2.3 1.6 - 2.6 mg/dL       Point of Care Testing  (Last 24 hours)  Glucose: 100 (09/07/18 0645)    Radiology and other Diagnostics Review:    Pertinent radiology reviewed.    Kerney Elbe, MD   Pager

## 2018-09-07 NOTE — Progress Notes
SPEECH-LANGUAGE PATHOLOGY  COGNITIVE-COMMUNICATION ASSESSMENT     EVALUATION SUMMARY  Pt seen for a cognitive screening utilizing the [facility], [facility] Cognitive Assessment [facility]). Pt scored 16/30, which indicates a moderate cognitive impairment. Areas of difficulty identified in the [facility] included: memory/delayed recall, attention, naming, visuospatial/executive functioning, abstraction, and language. Informal assessment of verbal problem solving suggests: WFL simple problem solving. Pt reports independently managing his finances, medications, and meal preparation prior to admission. Please see further details below.     RECOMMENDATIONS:  SLP will follow for ongoing assessment and monitoring of cognitive skills at 1-2x/wk while admitted. Ongoing SLP at next level of care.   Recommend assist w/ finance & medication management and meal preparation upon discharge secondary to patient's impaired attention and/or memory.     PRAGMATICS & BEHAVIOR  Comments*: Limited eye contact, but appropriate affect for the situation. Pt cooperative and pleasant throughout evaluation.      AUDITORY COMPREHENSION   Comments*: No focal language deficits noted.     VERBAL PROBLEM SOLVING  Comments:Functional problem solving: 4/4 accuracy without cues.     Montreal Cognitive Assessment (MoCA), Version 7.1    Subtest / MOCA item  Patient Score Comments   Visuospatial/   Executive   Function Trails  0/1      Cube  0/1      Clock  1/3       Naming  2/3  Unable to provide name for rhinoceros. Of note, pt does not report word-finding difficulties.   Attention  Digit Span  2/2      Letter A  0/1      Serial 7  3/3     Language  Sentence Repetition  1/2      Fluency  0/1  Generated 8 items in one minute.     Abstraction  0/2      Delayed Recall  1/5  Improved to 3/5 with mod-max cues.    Orientation  6/6       Total  16/30       Within normal limits =  26 or greater  Mild cognitive impairment = 18-25   Moderate cognitive impairment = 10-17 Severe cognitive impairment = less than 10    Overall Cognitive Severity Level: Mod  Prognosis: Fair  Plan: Continue Treatment  2-3x/Week, Patient Would Benefit from Further Speech Therapy Post Acute Hospitalization.  Results Reported to Physician: Yes    Objective*  Relevant Med Background: 83 year old man with a history of chronic systolic heart failure, ischemic cardiomyopathy with an EF of 20%, coronary artery disease, hypertension, neurocardiogenic syncope, paroxysmal atrial fibrillation, ventricular tachycardia status post jugular tachycardia ablations x2 in 2015, history of ICD implantation, cryptogenic stroke.  He had presented on 8/7 with symptoms suggestive of acute on chronic diastolic and systolic heart failure exacerbation.    CT Head: 1. No acute intracranial hemorrhage or mass effect.  2. Generalized cerebral and cerebellar volume loss.  3. Bilateral deep and periventricular white matter low-attenuation   suggestive of chronic small vessel ischemic changes.  4. Right mastoid fluid  5. Partially visualized posterior subcutaneous mass or cyst measuring at   least 2.1 cm. Direct clinical correlation is suggested    Handedness: Right  Hearing: Hard of Hearing - R, Hard of Hearing - L  Education Level: High School  Lives With: Alone  Receives Help From: None Needed  Vocational: Retired  Psychosocial Status: Willing and Cooperative to Participate  Persons Present: None    Subjective*  Pain:  Patient has no complaint of pain  Pain Level Current*: No pain  Trach Presence: No  Feeding Tube Present During Eval: None    Education*  Persons Educated: Patient  Barriers To Learning: None Noted(slightly reduced insight into deficits)  Interventions: Staff Educated  Teaching Methods: Verbal  Topics: Memory  Patient Response: Verbalized Understanding  Goal Formulation: With Patient    Cognitive Goals*  Goal : Pt will recall 3/3 items following 3 minute delay given mild-mod cues. Goal : Pt will participate in ongoing cognitive-communication assessment and treatment given mild-mod cues.     Speech Discharge Recommendations  Patient Currently Requires Supervision For: Finances    Therapist: Rozell Searing, MHS, L/CCC-SLP Voalte: [phone]  Date: 09/07/2018

## 2018-09-07 NOTE — Progress Notes
Staff Cardiology Progress Note      Admission Date: 09/04/2018  Today's Date: 09/07/2018  LOS: 1 day        Assessment & Plan   Logan Bright is a 83 y.o. patient with the following problems:    1.  Acute on chronic combined heart failure  ??? Exam today is normal, no evidence of decompensation, lying flat, no JVD, comfortably breathing on room air  ??? Recorded diuresis since admission 2.5 L, weight today 194 pounds, appears below his normal rate.  ??? Lets add back PTA Lasix today along with potassium  ??? I have reduced both Coreg and losartan from 6.25--> 3.125 and 25-->12.5 respectively with concerns for hypotension    2.  Dual-chamber ICD  ???High RV pacing upon presentation, greater than 80% for past 4 months  ???EP now following with consideration for biventricular ICD  ???In review of telemetry overnight, a paced, no V pacing, a pacing percentage 91%, V pacing percentage 0.  ???Unfortunately very long AV delay which I think we tolerate for now  ??? There is opportunity for upgrade to biventricular ICD, but anticipate outpatient setting after trial of adjusted AV delay  ???EP following    3.  PAF  ??? Minimal A. fib burden on amiodarone, continue same along with anticoagulation, adjusted for renal function Eliquis    4.  CKD 3  ???Stable renal function    4.  CAD  ???Extensive LAD infarct pattern on nuclear stress test 3/20, unchanged from prior study 2014.???Medical management  ???No complaints of angina.    Appetite good.  Voices no specific complaints.    We will continue to follow with primary team, anticipate  possible need for placement at discharge?         Subjective    Patient feeling well today, voices no complaints of dyspnea.  No chest pain.    Medications  Scheduled Meds:allopurinoL (ZYLOPRIM) tablet 100 mg, 100 mg, Oral, QHS  amiodarone (CORDARONE) tablet 200 mg, 200 mg, Oral, QDAY  apixaban (ELIQUIS) tablet 2.5 mg, 2.5 mg, Oral, BID  aspirin EC tablet 81 mg, 81 mg, Oral, QDAY carvediloL (COREG) tablet 6.25 mg, 6.25 mg, Oral, BID w/meals  levothyroxine (SYNTHROID) tablet 50 mcg, 50 mcg, Oral, QDAY 30 min before breakfast  losartan (COZAAR) tablet 25 mg, 25 mg, Oral, BID    Continuous Infusions:  PRN and Respiratory Meds:acetaminophen Q6H PRN, polyethylene glycol 3350 QDAY PRN, senna/docusate BID PRN      Objective                       Vital Signs: Last Filed                 Vital Signs: 24 Hour Range   BP: 116/73 (08/10 0746)  Temp: 36.4 ???C (97.5 ???F) (08/10 0746)  Pulse: 60 (08/10 0746)  Respirations: 18 PER MINUTE (08/10 0746)  SpO2: 96 % (08/10 0746) BP: (80-116)/(45-73)   Temp:  [36.3 ???C (97.3 ???F)-36.8 ???C (98.2 ???F)]   Pulse:  [55-64]   Respirations:  [16 PER MINUTE-36 PER MINUTE]   SpO2:  [92 %-100 %]    Intensity Pain Scale (Self Report): Asleep (09/07/18 0400) Vitals:    09/05/18 0700 09/06/18 0341 09/07/18 0557   Weight: 92.4 kg (203 lb 12.8 oz) 88.7 kg (195 lb 8.8 oz) 88.4 kg (194 lb 14.2 oz)         Intake/Output Summary:  (Last 24 hours)    Intake/Output Summary (  Last 24 hours) at 09/07/2018 0919  Last data filed at 09/07/2018 0747  Gross per 24 hour   Intake 600 ml   Output 275 ml   Net 325 ml           Body mass index is 26.43 kg/m???.    Physical Exam        General Appearance: no acute distress, full a/o  HEENT: EOMI, MM-moist, post OP-clear  Neck Veins: neck veins are flat & not distended  Carotid Arteries: no bruits  Chest Inspection: chest is normal in appearance  Auscultation/Percussion: lungs clear to auscultation, no rales, rhonchi, or wheezing  Cardiac Rhythm: regular rhythm & normal rate  Cardiac Auscultation: Normal S1 & S2, no S3 or S4, no rub  Murmurs: no cardiac murmurs  Abdominal Exam: soft, non-tender, normal bowel sounds, no masses or bruits  Abdominal aorta: nonpalpable   Liver & Spleen: no organomegaly  Extremities: no lower extremity edema; 2+ symmetric distal pulses  Skin: warm & intact  Neurologic Exam: oriented to time, place and person; no focal neurologic deficits          Lab Review  Results for orders placed or performed during the hospital encounter of 09/04/18 (from the past 24 hour(s))   CBC AND DIFF    Collection Time: 09/06/18 12:28 PM   Result Value Ref Range    White Blood Cells 3.8 (L) 4.5 - 11.0 K/UL    RBC 5.07 4.4 - 5.5 M/UL    Hemoglobin 16.2 13.5 - 16.5 GM/DL    Hematocrit 16.1 40 - 50 %    MCV 95.7 80 - 100 FL    MCH 31.9 26 - 34 PG    MCHC 33.4 32.0 - 36.0 G/DL    RDW 09.6 (H) 11 - 15 %    Platelet Count 91 (L) 150 - 400 K/UL    MPV 9.2 7 - 11 FL    Neutrophils 83 (H) 41 - 77 %    Lymphocytes 7 (L) 24 - 44 %    Monocytes 9 4 - 12 %    Eosinophils 1 0 - 5 %    Basophils 0 0 - 2 %    Absolute Neutrophil Count 3.12 1.8 - 7.0 K/UL    Absolute Lymph Count 0.28 (L) 1.0 - 4.8 K/UL    Absolute Monocyte Count 0.32 0 - 0.80 K/UL    Absolute Eosinophil Count 0.03 0 - 0.45 K/UL    Absolute Basophil Count 0.01 0 - 0.20 K/UL   COMPREHENSIVE METABOLIC PANEL    Collection Time: 09/06/18 12:28 PM   Result Value Ref Range    Sodium 133 (L) 137 - 147 MMOL/L    Potassium 4.2 3.5 - 5.1 MMOL/L    Chloride 98 98 - 110 MMOL/L    Glucose 148 (H) 70 - 100 MG/DL    Blood Urea Nitrogen 33 (H) 7 - 25 MG/DL    Creatinine 0.45 (H) 0.4 - 1.24 MG/DL    Calcium 7.6 (L) 8.5 - 10.6 MG/DL    Total Protein 5.6 (L) 6.0 - 8.0 G/DL    Total Bilirubin 0.9 0.3 - 1.2 MG/DL    Albumin 3.1 (L) 3.5 - 5.0 G/DL    Alk Phosphatase 96 25 - 110 U/L    AST (SGOT) 22 7 - 40 U/L    CO2 24 21 - 30 MMOL/L    ALT (SGPT) 22 7 - 56 U/L    Anion Gap 11 3 - 12  eGFR Non African American 39 (L) >60 mL/min    eGFR African American 47 (L) >60 mL/min   LACTIC ACID (BG - RAPID LACTATE)    Collection Time: 09/06/18 12:28 PM   Result Value Ref Range    Lactic Acid,BG 2.2 (H) 0.5 - 2.0 MMOL/L   BNP (B-TYPE NATRIURETIC PEPTI)    Collection Time: 09/06/18 12:28 PM   Result Value Ref Range    B Type Natriuretic Peptide 1,256.0 (H) 0 - 100 PG/ML   TROPONIN-I    Collection Time: 09/06/18 12:28 PM Result Value Ref Range    Troponin-I 0.01 0.0 - 0.05 NG/ML   URINALYSIS DIPSTICK REFLEX TO CULTURE    Collection Time: 09/06/18  6:26 PM   Result Value Ref Range    Color,UA YELLOW     Turbidity,UA CLEAR CLEAR-CLEAR    Specific Gravity-Urine 1.009 1.003 - 1.035    pH,UA 6.0 5.0 - 8.0    Protein,UA NEG NEG-NEG    Glucose,UA NEG NEG-NEG    Ketones,UA NEG NEG-NEG    Bilirubin,UA NEG NEG-NEG    Blood,UA NEG NEG-NEG    Urobilinogen,UA INCREASED (A) NORM-NORMAL    Nitrite,UA NEG NEG-NEG    Leukocytes,UA NEG NEG-NEG    Urine Ascorbic Acid, UA NEG NEG-NEG   URINALYSIS MICROSCOPIC REFLEX TO CULTURE    Collection Time: 09/06/18  6:26 PM   Result Value Ref Range    WBCs,UA 0-2 0 - 2 /HPF    RBCs,UA 0-2 0 - 3 /HPF    Comment,UA       Criteria for reflex to culture are WBC>10, Positive Nitrite, and/or >=+1   leukocytes. If quantity is not sufficient, an addendum will follow.      MucousUA TRACE    CBC    Collection Time: 09/07/18  6:45 AM   Result Value Ref Range    White Blood Cells 3.6 (L) 4.5 - 11.0 K/UL    RBC 5.08 4.4 - 5.5 M/UL    Hemoglobin 16.4 13.5 - 16.5 GM/DL    Hematocrit 09.8 40 - 50 %    MCV 95.5 80 - 100 FL    MCH 32.3 26 - 34 PG    MCHC 33.9 32.0 - 36.0 G/DL    RDW 11.9 (H) 11 - 15 %    Platelet Count 104 (L) 150 - 400 K/UL    MPV 9.0 7 - 11 FL           Jana Half, MD

## 2018-09-07 NOTE — Progress Notes
EP Note:  We did reprogram dual chamber ICD today do DDDR mode.  We did short up AV delay to more physiologic setting.  He will RV pace all the time.  Will plan to reassess hemodynamics tomorrow along with patient symptoms.  If he does better with shortened AV delay (resulting in 100%) RV pacing, should consider upgrading to CRT-D device sooner rather than later.  This could potentially be done prior to discharge.    Will plan to reassess tomorrow.    Gaylan Gerold, NP-C (pgr [phone])  Heart Rhythm Management (pgr [phone] )

## 2018-09-07 NOTE — Progress Notes
Admit Note:  Profile completed  Care plan and pt education updated  High fall precautions initiated      Call light within reach. Bed low and locked.    Patient states he brought in "a bag of medications". Checked clothes, room, wall a roo and inpatient medication room. Unable to find at this time.  Primary RN notified and charge RN [facility] notified.

## 2018-09-07 NOTE — Progress Notes
Heart Failure Nursing Progress Note    Admission Date: 09/04/2018  LOS: 1 day    Admission Weight: 97.5 kg (215 lb)        Most recent weights (inpatient):   Vitals:    09/05/18 0700 09/06/18 0341 09/07/18 0557   Weight: 92.4 kg (203 lb 12.8 oz) 88.7 kg (195 lb 8.8 oz) 88.4 kg (194 lb 14.2 oz)     Weight change from previous day:-0.3kg    Fluid restriction ordered: 1500 ml    Intake/Output Summary: (Last 24 hours)    Intake/Output Summary (Last 24 hours) at 09/07/2018 [id number]  Last data filed at 09/07/2018 0557  Gross per 24 hour   Intake 600 ml   Output 275 ml   Net 325 ml       Is patient incontinent No    Anticipated discharge date: unknown    Discharge goals: Ongoing      Daily Assessment of Patient Stated Goals:    Short Term Goal Identified by patient (Short Term=during hospitalization):  N/A

## 2018-09-07 NOTE — Progress Notes
OCCUPATIONAL THERAPY  NOTE       Name: Logan Bright        MRN: [id number]          DOB: 25-Aug-1935          Age: 83 y.o.  Admission Date: 09/04/2018             LOS: 1 day      Patient denies changes from baseline ADLs and functional mobility and reports no history of balance loss or falls in the last three months.  Patient has not had a procedure or surgery that would make getting dressed difficult, including putting on socks and shoes.  Patient has not demonstrated or reported new difficulties with vision when completing functional tasks.  Patient endorses no concerns with functional skills at home.  Currently, the patient is ambulating on unit without difficulty.    Encouraged patient to continue to perform functional skills/ADLs while in the hospital and discussed the ability for the patient to complete their ADLs with the bedside nursing staff.  Occupational therapy services will be discontinued at this time, please re-consult if the patient has a change in functional status.      Therapist: Eustace Moore, OTR/L [phone]  Date: 09/07/2018

## 2018-09-07 NOTE — Progress Notes
Heart Failure Nursing Progress Note    Admission Date: 09/04/2018  LOS: 1 day    Admission Weight: 97.5 kg (215 lb)        Most recent weights (inpatient):   Vitals:    09/05/18 0700 09/06/18 0341 09/07/18 0557   Weight: 92.4 kg (203 lb 12.8 oz) 88.7 kg (195 lb 8.8 oz) 88.4 kg (194 lb 14.2 oz)     Weight change from previous day:-0.3kg    Fluid restriction ordered: 1.5L    Intake/Output Summary: (Last 24 hours)    Intake/Output Summary (Last 24 hours) at 09/07/2018 1613  Last data filed at 09/07/2018 1529  Gross per 24 hour   Intake 600 ml   Output 500 ml   Net 100 ml       Is patient incontinent No    Anticipated discharge date: 8/11   Discharge goals: Have a safe plan for discharge      Daily Assessment of Patient Stated Goals:    Short Term Goal Identified by patient (Short Term=during hospitalization):  N/A

## 2018-09-07 NOTE — Consults
See EP consult note by Dr. Rosendo Gros on 8/9.

## 2018-09-08 DIAGNOSIS — I5023 Acute on chronic systolic (congestive) heart failure: Secondary | ICD-10-CM

## 2018-09-08 LAB — COMPREHENSIVE METABOLIC PANEL: Lab: 135 MMOL/L — ABNORMAL LOW (ref 137–147)

## 2018-09-08 LAB — MAGNESIUM: Lab: 2.2 mg/dL — ABNORMAL HIGH (ref 1.6–2.6)

## 2018-09-08 LAB — CBC: Lab: 3.5 K/UL — ABNORMAL LOW (ref 4.5–11.0)

## 2018-09-08 MED ORDER — CEFAZOLIN INJ 1GM IVP
2 g | Freq: Once | INTRAVENOUS | 0 refills | Status: CP
Start: 2018-09-08 — End: ?

## 2018-09-08 MED ORDER — SODIUM CHLORIDE 0.9 % IV SOLP
INTRAVENOUS | 0 refills | Status: DC
Start: 2018-09-08 — End: 2018-09-10
  Administered 2018-09-09: 16:00:00 1000.000 mL via INTRAVENOUS

## 2018-09-08 MED ORDER — CEFAZOLIN INJ 1GM IVP
1 g | INTRAVENOUS | 0 refills | Status: AC
Start: 2018-09-08 — End: ?

## 2018-09-08 MED ORDER — LIDOCAINE (PF) 10 MG/ML (1 %) IJ SOLN
.1-2 mL | INTRAMUSCULAR | 0 refills | Status: DC | PRN
Start: 2018-09-08 — End: 2018-09-10

## 2018-09-08 NOTE — Progress Notes
PHYSICAL THERAPY  PROGRESS NOTE        Name: Logan Bright        MRN: [id number]          DOB: 09/20/1935          Age: 83 y.o.  Admission Date: 09/04/2018             LOS: 2 days        Mobility  Progressive Mobility Level: Walk in hallway  Distance Walked (feet): 150 ft  Level of Assistance: Assist X1  Assistive Device: None  Time Tolerated: 11-30 minutes  Activity Limited By: Fatigue    Subjective  Significant hospital events: PMHx of HTN, HLD, CAD, Ischemic cardiomyopathy, HFrEF, Hx of VT s/p ICD, Afib, CKD, neurologic cardiac syncope, and cryptogenic stroke who presents to the ED with dyspnea an fatigue and admitted for heart failure exacerbation and left-sided weakness.  Mental / Cognitive Status: Alert;Cooperative  Persons Present: Provider(at end of session)  Pain: Patient has no complaint of pain  Pain Interventions: Patient agrees to participate in therapy  Comments: Pt was in bed with his legs partially hanging off.  He agreed to therapy after explanation.  Patient Owned Equipment: Education officer, community [employer] Situation: Lives Alone  Type of Home: House  Entry Stairs: 1-2 Stairs  In-Home Stairs: Able to Live on One Level    Bed Mobility/Transfer  Bed Mobility: Supine to Sit: Modified Independent  Transfer Type: Sit to/from Stand  Transfer: Assistance Level: To/From;Bed;Standby Assist  Transfer: Assistive Device: None  Transfers: Type Of Assistance: Verbal Cues;For Safety Considerations  End Of Activity Status: Sitting at [facility] of Bed;Nursing Notified;Instructed Patient to Request Assist with Mobility;Instructed Patient to Use Call Light    Gait  Gait Distance: 150 feet(approximate)  Gait: Assistance Level: Safety Considerations(hand on gait belt as a precaution)  Gait: Assistive Device: None  Gait: Descriptors: Pace: Slow;Decreased step length;No balance loss(slightly unsteady)  Comments: guarded stride  Activity Limited By: Complaint of Fatigue    Assessment/Progress Comments: Pt is moving well but is limited by deconditioning.  He is slightly unsteady with ambulation but did not have loss of balance.      AM-PAC 6 Clicks Basic Mobility Inpatient  Turning from your back to your side while in a flat bed without using bed rails: None  Moving from lying on your back to sitting on the side of a flatbed without using bedrails : None  Moving to and from a bed to a chair (including a wheelchair): A Little  Standing up from a chair using your arms (e.g. wheelchair, or bedside chair): A Little  To walk in hospital room: A Little  Climbing 3-5 steps with a railing: A Little  Raw Score: 20  Standardized (T-scale) Score: 43.99  Basic Mobility CMS 0-100%: 33.32  CMS G Code Modifier for Basic Mobility: CJ    Goals  Goal Formulation: With Patient  Time For Goal Achievement: 5 days  Patient Will Go Supine To/From Sit: Independently, Met  Patient Will Transfer Sit to Stand: Independently  Patient Will Ambulate: Greater than 200 Feet, w/ No Device, Independently  Patient Will Go Up / Down Stairs: 1-2 Stairs, Independently    Plan  Treatment Interventions: Mobility Training  Plan Frequency: 5 Days per Week  PT Plan for Next Visit: *stairs    PT Discharge Recommendations  Recommendation: Home with intermittent supervision/assistance  Recommendation for Therapy Post Discharge: Home health    Therapist: Norva Karvonen, Physical therapist  assistant  Date: 09/08/2018

## 2018-09-08 NOTE — Progress Notes
Heart Failure Nursing Progress Note    Admission Date: 09/04/2018  LOS: 2 days    Admission Weight: 97.5 kg (215 lb)        Most recent weights (inpatient):   Vitals:    09/06/18 0341 09/07/18 0557 09/08/18 0346   Weight: 88.7 kg (195 lb 8.8 oz) 88.4 kg (194 lb 14.2 oz) 88 kg (194 lb)     Weight change from previous day: Less 0.4 kg    Fluid restriction ordered: Yes. 1.5L FR    Intake/Output Summary: (Last 24 hours)    Intake/Output Summary (Last 24 hours) at 09/08/2018 0736  Last data filed at 09/08/2018 [id number]  Gross per 24 hour   Intake 400 ml   Output 725 ml   Net -325 ml       Is patient incontinent No    Anticipated discharge date: TBD  Discharge goals: Have a safe plan for DC      Daily Assessment of Patient Stated Goals:    Short Term Goal Identified by patient (Short Term=during hospitalization):

## 2018-09-08 NOTE — Progress Notes
General Progress Note    Name:  Logan Bright   Today's Date:  09/08/2018  Admission Date: 09/04/2018  LOS: 2 days                     Assessment/Plan:    Principal Problem:    Acute on chronic systolic heart failure (HCC)  Active Problems:    Ischemic cardiomyopathy    HTN (hypertension)    Dyslipidemia    CKD (chronic kidney disease)    Automatic implantable cardioverter-defibrillator in situ    Recurrent ventricular tachycardia w/ 2 VT ablations 04/2013    Paroxysmal atrial fibrillation (HCC)    Acute on chronic systolic congestive heart failure (HCC)    Hyponatremia    Hypothyroidism    ???  Logan Bright is a 83 yo M with a PMHx of HTN, HLD, CAD, Ischemic cardiomyopathy, HFrEF, Hx of VT s/p ICD, Afib, CKD, neurologic cardiac syncope, and cryptogenic stroke who presents to the ED with dyspnea an fatigue and admitted for heart failure exacerbation and left-sided weakness.    Hypotension - resolved  Acute hypoxic respiratory failure, tachypnea - resolved  - concern for unstable bradycardia d/t pacemaker setting change vs hypovolemia d/t diuresis vs sepsis (possible pulmonary source?) vs iatrogenic (coreg increase) vs cardiogenic shock vs unlikely PE with eliquis use  - CXR:1. Mild cardiomegaly with increase in pulmonary venous congestion   suggesting CHF/fluid overload. 2. Persistent right pleural effusion with increase in adjacent consolidation, which may represent atelectasis and/or pneumonia.  - BNP 1256, improved from admission, troponin negative  - SIRS criteria (RR 30 - now improved, lymphopenia)  - no symptoms to suggest infection including PNA, denies pleuritis or cough and no recent concern for aspirations  - ambulating well with PT after episode.   - lactate 2.2 after 250 mL of NS  -Discussed with EP.  Telemetry consistent with possible underlying AV block with concern for exacerbation of bradycardia due to [employer] specific programming and recent setting change.  EP evaluating and potentially will change settings back to PTA programming  PLAN  > avoid further fluid boluses with HFrEF and improvement in BP  > diuresis as below  > appreciate EP and cardiology assistance with pacemaker functioning  > GDMT as below  > Incentive spirometry     Acute on chronic combined systolic and diastolic heart failure, EF 20%  Right ventricular failure   Ischemic cardiomyopathy  Ventricular tachycardia s/p ICD  CAD  - Most recent echo in 2015 with EF of 20% although had Regadenoson MPI on 04/14/18 which showed EF of 18% with extensive areas of prior ischemia but unchanged from prior study\  - ECHO 8/7: EF 20% w/ severe dilatation, new RV failure (severely reduced EF w/severe dilatation), Severely elevated CVP 10-20, Grade II diastolic dysfxn.   - S/p two VT ablations and placement of ICD  - Most recent device interrogation 8/7 with change in settings to reduce RV pacing (100% of the time prior to this)   - Troponin negative, BNP 1,849 on admission --> 1256   - CXR with findings of fluid overload  - PTA regimen: ASA, Eliquis, Coreg 6.25 mg BID, lasix 40 mg, losartan 25 mg BID  > TC???PYP ordered to evaluate for amyloidosis given increased IVS and low voltage EKG - This study is negative and not suggestive of ATTR cardiac amyloidosis.  A negative exam does not exclude AL cardiac amyloidosis  > K/L FLC serum and urine immunofixation WNL  Plan:  >  Continue PTA ASA, losartan  > reduce Coreg to 3.125 mg BID with hypotension and bradycardia  > continue appropriately reduced dose of eliqius  > Consult Heart Failure team  > consulted EP per cardiology recommendations, appreciate assistance.   > upgrading pacemaker 8/12, NPO @ [facility], holding eliquis, will need ot stay overnight until 8/13 for CXR  ???  Left-sided weakness - resolved  Cryptogenic stroke  - Patient reports being told he had a stroke by an Ophthalmologist but has never had symptoms  - Unaware of left-sided weakness noted on exam - No neck pain or numbness/tingling  - CT head without hemorrhage or evidence of stroke  Plan:  > Consulted Neurology, appreciate recommendations???no further work-up unless symptoms were to recur  ???  Hyponatremia - improving  - Na 130 on admission  - Likely hypervolemic hyponatremia from CHF, improving w/diuresis  Plan:  > Diuresis  ???  Atrial fibrillation  - CHADS2Vasc of at least 4  - PTA regimen: Eliquis and Coreg  Plan:   > Continue Eliquis at reduced dose with age, Cr   > Beta blocker as above  ???  CKD stage III  - Baseline Cr ~ 1.6, at baseline on admission  Plan:  > Monitor  ???  HTN  HLD  > Increase coreg to 12.5 mg BID for hypertension  > Continue PTA losartan    Concern for inability to care for himself d/t undiagnosed dementia  -Discussed with patient's DPOA who has significant concern for patient caring for himself at home.  -Lives at home alone without consistent caregiver  -DPOA reports home appears unlivable and has photo documentation, also has concern for ability to take his medications.  -We will continue discussion with patient and recommend discharge to inpatient setting.  - speech cog - MOCA 16, will need help with finances on discharge, but can likely be responsible for ADLs  PLAN  > patient declining placement as it would be OOP  > Patient's sister to return home on Sunday to assist with    > will consider psych eval for assistance with capacity evaluation if continues to refuse  > patient interested in possible placement  ???  FEN: No IVF, replace lytes PRN, cardiac diet  Ppx: Eliquis  Code status: DNAR-FI  Dispo: Continue admit to medicine    Bayard Hugger, MD  Internal Medicine   Available on [facility] and [facility].  Pager [phone]    ________________________________________________________________________    Subjective  Logan Bright is a 83 y.o. male.  Patient again reports he feels well. NAEON. He denies any complaints. He does not feel he can pay out of pocket for placement. He is amenable to [facility] and HH SW evaluation. His daughter will be coming into town over the weekend.     Medications  Scheduled Meds:allopurinoL (ZYLOPRIM) tablet 100 mg, 100 mg, Oral, QHS  amiodarone (CORDARONE) tablet 200 mg, 200 mg, Oral, QDAY  aspirin EC tablet 81 mg, 81 mg, Oral, QDAY  carvediloL (COREG) tablet 3.125 mg, 3.125 mg, Oral, BID w/meals  [START ON 09/09/2018] ceFAZolin (ANCEF) IVP 2 g, 2 g, Intravenous, ONCE    Followed by  [START ON 09/09/2018] ceFAZolin (ANCEF) IVP 1 g, 1 g, Intravenous, Q8H*  furosemide (LASIX) tablet 40 mg, 40 mg, Oral, QDAY  levothyroxine (SYNTHROID) tablet 50 mcg, 50 mcg, Oral, QDAY 30 min before breakfast  losartan (COZAAR) tablet 12.5 mg, 12.5 mg, Oral, BID  potassium chloride SR (K-DUR) tablet 20 mEq, 20 mEq, Oral, QDAY w/breakfast  Continuous Infusions:  ??? [START ON 09/09/2018] sodium chloride 0.9 %   infusion       PRN and Respiratory Meds:acetaminophen Q6H PRN, lidocaine PF PRN, polyethylene glycol 3350 QDAY PRN, senna/docusate BID PRN      Review of Systems:  All other systems reviewed and are negative.    Objective:                          Vital Signs: Last Filed                 Vital Signs: 24 Hour Range   BP: 98/64 (08/11 1144)  Temp: 36.2 ???C (97.2 ???F) (08/11 1144)  Pulse: 102 (08/11 1144)  Respirations: 18 PER MINUTE (08/11 1144)  SpO2: 95 % (08/11 1144) BP: (98-112)/(58-80)   Temp:  [36.2 ???C (97.2 ???F)-36.5 ???C (97.7 ???F)]   Pulse:  [60-102]   Respirations:  [18 PER MINUTE]   SpO2:  [93 %-96 %]      Vitals:    09/06/18 0341 09/07/18 0557 09/08/18 0346   Weight: 88.7 kg (195 lb 8.8 oz) 88.4 kg (194 lb 14.2 oz) 88 kg (194 lb)       Intake/Output Summary:  (Last 24 hours)    Intake/Output Summary (Last 24 hours) at 09/08/2018 1302  Last data filed at 09/08/2018 1145  Gross per 24 hour   Intake 400 ml   Output 1125 ml   Net -725 ml      Stool Occurrence: 1    Physical Exam  General appearance: alert, well-developed, well-nourished, cooperative and no distress Head: Normocephalic, without obvious abnormality, atraumatic  Eyes: negative findings: conjunctivae and sclerae normal, corneas clear and pupils equal, round, reactive to light and accomodation  Throat: Lips, mucosa, and tongue normal. Teeth and gums normal  Neck: supple, symmetrical, trachea midline, no adenopathy no JVD noted  Lungs: Crackles in bases but otherwise clear to auscultation bilaterally  Chest wall: ICD noted in left upper chest  Heart: irregular bradycardia, S1, S2 normal, no murmur  Abdomen: soft, non-tender. Bowel sounds normal. No masses,  no organomegaly  Extremities: edema 1+ improved pitting bilateral lower extremities to knee with compression stockings present  Neurologic: A&Ox4. CN's II-XII intact.      Lab Review   24-hour labs:    Results for orders placed or performed during the hospital encounter of 09/04/18 (from the past 24 hour(s))   CBC    Collection Time: 09/08/18  6:32 AM   Result Value Ref Range    White Blood Cells 3.5 (L) 4.5 - 11.0 K/UL    RBC 4.91 4.4 - 5.5 M/UL    Hemoglobin 15.7 13.5 - 16.5 GM/DL    Hematocrit 16.1 40 - 50 %    MCV 95.4 80 - 100 FL    MCH 31.9 26 - 34 PG    MCHC 33.4 32.0 - 36.0 G/DL    RDW 09.6 (H) 11 - 15 %    Platelet Count 95 (L) 150 - 400 K/UL    MPV 9.2 7 - 11 FL   COMPREHENSIVE METABOLIC PANEL    Collection Time: 09/08/18  6:32 AM   Result Value Ref Range    Sodium 135 (L) 137 - 147 MMOL/L    Potassium 4.1 3.5 - 5.1 MMOL/L    Chloride 98 98 - 110 MMOL/L    Glucose 90 70 - 100 MG/DL    Blood Urea Nitrogen 33 (H) 7 - 25  MG/DL    Creatinine 1.61 (H) 0.4 - 1.24 MG/DL    Calcium 7.7 (L) 8.5 - 10.6 MG/DL    Total Protein 5.4 (L) 6.0 - 8.0 G/DL    Total Bilirubin 0.8 0.3 - 1.2 MG/DL    Albumin 2.9 (L) 3.5 - 5.0 G/DL    Alk Phosphatase 95 25 - 110 U/L    AST (SGOT) 24 7 - 40 U/L    CO2 28 21 - 30 MMOL/L    ALT (SGPT) 28 7 - 56 U/L    Anion Gap 9 3 - 12    eGFR Non African American 44 (L) >60 mL/min    eGFR African American 53 (L) >60 mL/min   MAGNESIUM Collection Time: 09/08/18  6:32 AM   Result Value Ref Range    Magnesium 2.2 1.6 - 2.6 mg/dL       Point of Care Testing  (Last 24 hours)  Glucose: 90 (09/08/18 0960)    Radiology and other Diagnostics Review:    Pertinent radiology reviewed.    Kerney Elbe, MD   Pager

## 2018-09-08 NOTE — Progress Notes
Staff Cardiology Progress Note      Admission Date: 09/04/2018  Today's Date: 09/08/2018  LOS: 2 days        Assessment & Plan   Logan Bright is a 83 y.o. patient with the following problems:    1.  Acute on chronic combined heart failure  ???Exam remains normal, breathing comfortably, no subjective dyspnea and exam remains normal.  ??? Continue current medications without adjustment, low-dose GDM T without blood pressure room to titrate  ??? We will continue PTA diuretic, Lasix 40 daily along with potassium  ???Weight is at/below his typical dry weight      2.  Dual-chamber ICD  ???High RV pacing upon presentation, greater than 80% for past 4 months  ???Adjustments made Sunday lengtheng AV delay to promote intrinsic conduction, unfortunately very long the leg necessary  ???Adjustments made back yesterday shortening AV delay to more physiologic, now 100% RV paced  ???EP following along, anticipate upgrading to biventricular ICD prior to discharge, timing per EP team, I have reached out to them today    3.  PAF  ??? Minimal A. fib burden on amiodarone, continue same along with anticoagulation, adjusted for renal function Eliquis  ???Reasonable to hold Eliquis for upgrade, defer to EP    4.  CKD 3  ???Stable renal function    4.  CAD  ???Extensive LAD infarct pattern on nuclear stress test 3/20, unchanged from prior study 2014.???Medical management  ???No complaints of angina.  ???Stable    We will continue to follow with primary team, anticipate  possible need for placement at discharge?         Subjective    Feeling well today.  I reviewed the results of his technetium PYP scan with him, negative.  No complaints of chest pain, no dizziness, dyspnea, appetite good.    We talked about rationale for upgrading to biventricular ICD, he was receptive and agreeable.  Timing per EP team  Medications  Scheduled Meds:allopurinoL (ZYLOPRIM) tablet 100 mg, 100 mg, Oral, QHS  amiodarone (CORDARONE) tablet 200 mg, 200 mg, Oral, QDAY apixaban (ELIQUIS) tablet 2.5 mg, 2.5 mg, Oral, BID  aspirin EC tablet 81 mg, 81 mg, Oral, QDAY  carvediloL (COREG) tablet 3.125 mg, 3.125 mg, Oral, BID w/meals  furosemide (LASIX) tablet 40 mg, 40 mg, Oral, QDAY  levothyroxine (SYNTHROID) tablet 50 mcg, 50 mcg, Oral, QDAY 30 min before breakfast  losartan (COZAAR) tablet 12.5 mg, 12.5 mg, Oral, BID  potassium chloride SR (K-DUR) tablet 20 mEq, 20 mEq, Oral, QDAY w/breakfast    Continuous Infusions:  PRN and Respiratory Meds:acetaminophen Q6H PRN, polyethylene glycol 3350 QDAY PRN, senna/docusate BID PRN      Objective                       Vital Signs: Last Filed                 Vital Signs: 24 Hour Range   BP: 111/61 (08/11 0346)  Temp: 36.3 ???C (97.4 ???F) (08/11 0346)  Pulse: 64 (08/11 0346)  Respirations: 18 PER MINUTE (08/11 0346)  SpO2: 94 % (08/11 0346) BP: (102-112)/(61-80)   Temp:  [36.3 ???C (97.3 ???F)-36.5 ???C (97.7 ???F)]   Pulse:  [61-72]   Respirations:  [18 PER MINUTE]   SpO2:  [93 %-96 %]      Vitals:    09/06/18 0341 09/07/18 0557 09/08/18 0346   Weight: 88.7 kg (195 lb 8.8 oz) 88.4 kg (194  lb 14.2 oz) 88 kg (194 lb)         Intake/Output Summary:  (Last 24 hours)    Intake/Output Summary (Last 24 hours) at 09/08/2018 0758  Last data filed at 09/08/2018 1610  Gross per 24 hour   Intake 400 ml   Output 725 ml   Net -325 ml           Body mass index is 26.31 kg/m???.    Physical Exam        General Appearance: no acute distress, full a/o  HEENT: EOMI, MM-moist, post OP-clear  Neck Veins: neck veins are flat & not distended  Carotid Arteries: no bruits  Chest Inspection: chest is normal in appearance, ICD left chest  Auscultation/Percussion: lungs clear to auscultation, no rales, rhonchi, or wheezing  Cardiac Rhythm: regular rhythm & normal rate  Cardiac Auscultation: Normal S1 & S2, no S3 or S4, no rub  Murmurs: no cardiac murmurs  Abdominal Exam: soft, non-tender, normal bowel sounds, no masses or bruits  Abdominal aorta: nonpalpable Liver & Spleen: no organomegaly  Extremities: no lower extremity edema; 2+ symmetric distal pulses  Skin: warm & intact  Neurologic Exam: oriented to time, place and person; no focal neurologic deficits          Lab Review  Results for orders placed or performed during the hospital encounter of 09/04/18 (from the past 24 hour(s))   CBC    Collection Time: 09/08/18  6:32 AM   Result Value Ref Range    White Blood Cells 3.5 (L) 4.5 - 11.0 K/UL    RBC 4.91 4.4 - 5.5 M/UL    Hemoglobin 15.7 13.5 - 16.5 GM/DL    Hematocrit 96.0 40 - 50 %    MCV 95.4 80 - 100 FL    MCH 31.9 26 - 34 PG    MCHC 33.4 32.0 - 36.0 G/DL    RDW 45.4 (H) 11 - 15 %    Platelet Count 95 (L) 150 - 400 K/UL    MPV 9.2 7 - 11 FL   COMPREHENSIVE METABOLIC PANEL    Collection Time: 09/08/18  6:32 AM   Result Value Ref Range    Sodium 135 (L) 137 - 147 MMOL/L    Potassium 4.1 3.5 - 5.1 MMOL/L    Chloride 98 98 - 110 MMOL/L    Glucose 90 70 - 100 MG/DL    Blood Urea Nitrogen 33 (H) 7 - 25 MG/DL    Creatinine 0.98 (H) 0.4 - 1.24 MG/DL    Calcium 7.7 (L) 8.5 - 10.6 MG/DL    Total Protein 5.4 (L) 6.0 - 8.0 G/DL    Total Bilirubin 0.8 0.3 - 1.2 MG/DL    Albumin 2.9 (L) 3.5 - 5.0 G/DL    Alk Phosphatase 95 25 - 110 U/L    AST (SGOT) 24 7 - 40 U/L    CO2 28 21 - 30 MMOL/L    ALT (SGPT) 28 7 - 56 U/L    Anion Gap 9 3 - 12    eGFR Non African American 44 (L) >60 mL/min    eGFR African American 53 (L) >60 mL/min   MAGNESIUM    Collection Time: 09/08/18  6:32 AM   Result Value Ref Range    Magnesium 2.2 1.6 - 2.6 mg/dL           Jana Half, MD

## 2018-09-08 NOTE — Case Management (ED)
Case Management Progress Note    NAME:Logan Bright                          MRN: [id number]              DOB:1935/09/25          AGE: 83 y.o.  ADMISSION DATE: 09/04/2018             DAYS ADMITTED: LOS: 2 days      Today???s Date: 09/08/2018    Plan  Anticipate d/c to home when medically stable.     Interventions  ? Support      ? Info or Referral      ? Discharge Planning  ??? SW spoke with Med A team. Per team, pt is medically stable to d/c.   ??? PT/OT recommending home. SLP MOCA suggests pt would benefit from assistance in the community. [facility] pending.   ??? SW spoke with pt regarding d/c plan. Pt was alert and oriented x4 during conversation. SW discussed PT/OT recommendations as well as concerns [name] shared about his home/living situation. Initially, pt was open to placement, but SW explained it would not be IPR setting where pt has been in the past, it would be LTC at a nursing home either [facility] pending or private pay. Pt is refusing due to cost and fear of losing his home.   ??? SW called and spoke with pt's DPOA/Friend, [name] [phone] to update to above. [name] explained that pt is a hoarder and there is only a small path to walk in his home, the sinks do not work properly, there is trash and wrappers all over, and mold in the basement. [name] states she has helped him clean it several times but does not have it in her to do it again. [name] states pt's daughter lives in [place] and plans to come here this weekend to help.     ? Medication Needs      ?  Financial      ? Legal      ? Other        Disposition  ? Expected Discharge Date    Expected Discharge Date: 09/09/18  Expected Discharge Time: 1600  ? Transportation   Does the patient need discharge transport arranged?: No  Transportation Name, Phone and Availability #1[facility] [phone]  Does the patient use Medicaid Transportation?: No  ? Next Level of Care (Acute Psych discharges only)      ? Discharge Disposition Durable Medical Equipment      No service has been selected for the patient.      [facility]      No service has been selected for the patient.      [facility]      No service has been selected for the patient.      [facility]      No service has been selected for the patient.        Sharen Hones, LMSW  Social Work Database administrator [phone]  Pager [phone]

## 2018-09-08 NOTE — Progress Notes
Electrophysiology Progress Note  Admission Date: 09/04/2018  Today's Date: 09/08/2018  LOS: 2 days    Assessment & Plan   Logan Bright is a 83 y.o. male patient with the following problems:    Principal Problem:    Acute on chronic systolic heart failure (HCC)  Active Problems:    Ischemic cardiomyopathy    HTN (hypertension)    Dyslipidemia    CKD (chronic kidney disease)    Automatic implantable cardioverter-defibrillator in situ    Recurrent ventricular tachycardia w/ 2 VT ablations 04/2013    Paroxysmal atrial fibrillation (HCC)    Acute on chronic systolic congestive heart failure (HCC)    Hyponatremia    Hypothyroidism    Assessment:  Ischemic cardiomhyopathy  - EF 20%  - Dual chamber MDT ICD placed in 2013    Sinus node dysfunction and AV nodal disease  - AP-98% at lower rate of 60 bpm  - RV pacing near 100% due to very long AV delays  ???  Acute on chronic systolic and diastolic HF exacerbation:   - improving with diuresis (managed by HF team)  ???  Ventricular Tachycardia s/p VT ablation x 2 in 2015:   - currently on amio without recurrence  ???  Paroxysmal Atrial Fibrillation:   - CHADS VASC of  6 (CAD, HF, Age2, Stroke2 )  - on amiodarone and Eliquis PTA    CAD with prior MI  - medical management    CKD stage 3, stable      Recommendations:  Given persistent LV dysfunction and nearly 100% RV pacing with physiologic AV delays, seems reasonable to proceed with BiV upgrade to device.  Will tentatively plan for tomorrow.  Reviewed with patient.  Will hold am Eliquis tonight and tomorrow am in the event that tunneling is necessary.  Will place orders.  NPO after midnight.  NO LOVENOX OR HEPARIN X 72 HOURS POST IMPLANT.    Jene Every, NP-C (pgr [phone])  Heart Rhythm Management (pgr [phone] )      Medicare attestation       Subjective:  Awake and alert this am, sitting on edge of bed.  Denies complaints of dyspnea, palpitations, dizziness or lightheadedness.      Medications Scheduled Meds:allopurinoL (ZYLOPRIM) tablet 100 mg, 100 mg, Oral, QHS  amiodarone (CORDARONE) tablet 200 mg, 200 mg, Oral, QDAY  aspirin EC tablet 81 mg, 81 mg, Oral, QDAY  carvediloL (COREG) tablet 3.125 mg, 3.125 mg, Oral, BID w/meals  [START ON 09/09/2018] ceFAZolin (ANCEF) IVP 2 g, 2 g, Intravenous, ONCE    Followed by  [START ON 09/09/2018] ceFAZolin (ANCEF) IVP 1 g, 1 g, Intravenous, Q8H*  furosemide (LASIX) tablet 40 mg, 40 mg, Oral, QDAY  levothyroxine (SYNTHROID) tablet 50 mcg, 50 mcg, Oral, QDAY 30 min before breakfast  losartan (COZAAR) tablet 12.5 mg, 12.5 mg, Oral, BID  potassium chloride SR (K-DUR) tablet 20 mEq, 20 mEq, Oral, QDAY w/breakfast    Continuous Infusions:  ??? [START ON 09/09/2018] sodium chloride 0.9 %   infusion       PRN and Respiratory Meds:acetaminophen Q6H PRN, lidocaine PF PRN, polyethylene glycol 3350 QDAY PRN, senna/docusate BID PRN    Objective                       Vital Signs: Last Filed                 Vital Signs: 24 Hour Range   BP: 104/58 (08/11 1914)  Temp: 36.3 ???C (97.3 ???F) (08/11 4132)  Pulse: 60 (08/11 0822)  Respirations: 18 PER MINUTE (08/11 0822)  SpO2: 94 % (08/11 0822) BP: (102-112)/(58-80)   Temp:  [36.3 ???C (97.3 ???F)-36.5 ???C (97.7 ???F)]   Pulse:  [60-72]   Respirations:  [18 PER MINUTE]   SpO2:  [93 %-96 %]      Vitals:    09/06/18 0341 09/07/18 0557 09/08/18 0346   Weight: 88.7 kg (195 lb 8.8 oz) 88.4 kg (194 lb 14.2 oz) 88 kg (194 lb)         Intake/Output Summary:  (Last 24 hours)    Intake/Output Summary (Last 24 hours) at 09/08/2018 1020  Last data filed at 09/08/2018 0959  Gross per 24 hour   Intake 400 ml   Output 925 ml   Net -525 ml           Body mass index is 26.31 kg/m???.    Physical Exam        GEN: no acute distress  HEENT: nontraumatic, MM-moist, neck supple, no JVD  CHEST: nonlabored   EXT: no c/c/e  NEURO: A&Ox3  SKIN: warm and dry    Lab Review  Hematology:    Lab Results   Component Value Date    HGB 15.7 09/08/2018    HCT 46.9 09/08/2018 PLTCT 95 09/08/2018    WBC 3.5 09/08/2018    NEUT 83 09/06/2018    ANC 3.12 09/06/2018    ALC 0.28 09/06/2018    MONA 9 09/06/2018    AMC 0.32 09/06/2018    ABC 0.01 09/06/2018    MCV 95.4 09/08/2018    MCHC 33.4 09/08/2018    MPV 9.2 09/08/2018    RDW 16.2 09/08/2018   , General Chemistry:    Lab Results   Component Value Date    NA 135 09/08/2018    K 4.1 09/08/2018    CL 98 09/08/2018    GAP 9 09/08/2018    BUN 33 09/08/2018    CR 1.54 09/08/2018    GLU 90 09/08/2018    CA 7.7 09/08/2018    ALBUMIN 2.9 09/08/2018    OBSCA 0.96 05/13/2013    MG 2.2 09/08/2018    TOTBILI 0.8 09/08/2018    and Endocrine:   Lab Results   Component Value Date    TSH 5.25 09/04/2018     Telemetry  AP-VP rhythm       Tona Sensing, APRN-NP (pgr [phone])

## 2018-09-08 NOTE — Progress Notes
OCCUPATIONAL THERAPY  [facility] EVALUATION OF LIVING SKILLS (KELS)     Name: Logan Bright        MRN: [id number]          DOB: 27-Sep-1935          Age: 83 y.o.  Admission Date: 09/04/2018             LOS: 2 days    The [employer] Evaluation of Living Skills [facility]) is an occupational therapy evaluation designed to determine a person's ability to perform basic living skills and to make recommendations for living situations that promote safety, health and independence.      The goal of the KELS is to provide valuable information used to match a living environment with a person's strengths, enabling the person to live safely in the least restrictive environment possible.    Administration of the [employer] interview questions and tasks.    ---------------------------------------------------------------------------------------------------------------------------  Pt lives in house in [place].   Chart review Bayard Hugger, MD, 09/07/18 note:)  Concern for inability to care for himself d/t undiagnosed dementia  -Discussed with patient's DPOA who has significant concern for patient caring for himself at home.  -Lives at home alone without consistent caregiver  -DPOA reports home appears unlivable and has photo documentation, also has concern for ability to take his medications.    SLP MOCA of 16/30 indicates moderate cognitive impairment.  ------------------------------------------------------------------------------------------------------------------------------------    Self-Care*  Appearance: See Note(hospital gown)  Frequency Of Self-care Activities (self-report): Independent    Safety And Health*  Awareness Of Dangerous Household Situations (from photographs): Needs Assistance(identified 3/4 dangerous situations; did not notice electrical cord across floor as trip hazard but instead identified it as electrical hazard)    Identification of appropriate action for sickness, accidents, and emergencies: Needs Assistance(does not know number to call for fire; stated he's unsure if [place] fire department # is 911. Does know call 911 for medical emergencies. )    Knowledge of location of medical and dental facilities: Needs Assistance(has no regular physician or dentist)    Money Management*  Use of cash in purchasing items: Independent    Payment Of Bills: Needs Assistance(neighbor set bills up on automatic pay. Pt unable to process who to write a check to for hypothetical utility bill.)    Obtain/Maintain Source Of Income: Needs Assistance(Pt doesn't know what phone # to call if retirement or Social Security payments are not automatic deposited. )    Data processing manager and Telephone*  Mobility Within Community: Needs Assistance(walks)    Basic Knowledge of PublicTransit System: Not Applicable(no bus service in [place])    Use of Telephone: Needs Assistance(dialed phone # incorrectly)    Employment and Leisure Participation*  Plans For Future Employment: Not Applicable(retired)    Leisure Activity Involvement: Needs Assistance(does not have leisure activities)  -----------------------------------------------------------------------------------------------------------------------------------    Medication Management (modified) portion of Performance Assessment of Self-Care Skills (PASS) was administered to pt: Needs Assistance. Was unable to process to place any of the 3 prescriptions into medication box.    =====================================================================    Areas of Concern: Observations made during administration of the KELS*  Ability to Read to Complete Forms: Yes  Forms Completed by Interview: Yes     Pleasant and cooperative    Cognition: Ability to Comprehend and Complete Forms*  Ability to Comprehend and Complete Forms: (fair)  Ability to Follow Directions: (fair)  Time Orientation: Accurate within 2 days  Memory of Address: Accurate  Memory of  age : Accurate Ability to Attend to Task: (fair.)    Ability to Live in the Community or Assistance Needed:  Pt needs assistance to be safe and meet basic needs:   Monitoring for safety   Regular phone calls/emails/video calls to check in,   Regular in-person visits to check in,    Payment of bills,    Transportation provided    Leisure activity participation initiated,   Medication management    Recommend the following living situation:  Pt would benefit from living with others who can provide structure & supervision.    Therapist: Graylin Shiver, OT   Psychosocial OT  [place] [phone]  Date: 09/08/2018

## 2018-09-09 ENCOUNTER — Encounter: Admit: 2018-09-09 | Discharge: 2018-09-09 | Primary: Cardiovascular Disease

## 2018-09-09 ENCOUNTER — Ambulatory Visit: Admit: 2018-09-09 | Discharge: 2018-09-09 | Primary: Cardiovascular Disease

## 2018-09-09 DIAGNOSIS — I255 Ischemic cardiomyopathy: Secondary | ICD-10-CM

## 2018-09-09 DIAGNOSIS — E785 Hyperlipidemia, unspecified: Secondary | ICD-10-CM

## 2018-09-09 DIAGNOSIS — Z8679 Personal history of other diseases of the circulatory system: Secondary | ICD-10-CM

## 2018-09-09 DIAGNOSIS — I5023 Acute on chronic systolic (congestive) heart failure: Secondary | ICD-10-CM

## 2018-09-09 DIAGNOSIS — I10 Essential (primary) hypertension: Secondary | ICD-10-CM

## 2018-09-09 DIAGNOSIS — T148XXA Other injury of unspecified body region, initial encounter: Secondary | ICD-10-CM

## 2018-09-09 DIAGNOSIS — Z95810 Presence of automatic (implantable) cardiac defibrillator: Secondary | ICD-10-CM

## 2018-09-09 DIAGNOSIS — M1990 Unspecified osteoarthritis, unspecified site: Secondary | ICD-10-CM

## 2018-09-09 DIAGNOSIS — Z955 Presence of coronary angioplasty implant and graft: Secondary | ICD-10-CM

## 2018-09-09 DIAGNOSIS — R55 Syncope and collapse: Secondary | ICD-10-CM

## 2018-09-09 DIAGNOSIS — I2510 Atherosclerotic heart disease of native coronary artery without angina pectoris: Secondary | ICD-10-CM

## 2018-09-09 DIAGNOSIS — N289 Disorder of kidney and ureter, unspecified: Secondary | ICD-10-CM

## 2018-09-09 LAB — COMPREHENSIVE METABOLIC PANEL
Lab: 0.9 mg/dL (ref 0.3–1.2)
Lab: 1.5 mg/dL — ABNORMAL HIGH (ref 0.4–1.24)
Lab: 101 mg/dL — ABNORMAL HIGH (ref 70–100)
Lab: 134 MMOL/L — ABNORMAL LOW (ref 137–147)
Lab: 3 g/dL — ABNORMAL LOW (ref 3.5–5.0)
Lab: 37 mg/dL — ABNORMAL HIGH (ref 7–25)
Lab: 5.6 g/dL — ABNORMAL LOW (ref 6.0–8.0)
Lab: 52 mL/min — ABNORMAL LOW (ref >60)
Lab: 7.8 mg/dL — ABNORMAL LOW (ref 8.5–10.6)
Lab: 98 MMOL/L (ref 98–110)

## 2018-09-09 LAB — MAGNESIUM: Lab: 2 mg/dL (ref 1.6–2.6)

## 2018-09-09 LAB — CBC: Lab: 4.1 K/UL — ABNORMAL LOW (ref 4.5–11.0)

## 2018-09-09 LAB — PROTIME INR (PT): Lab: 1.4 — ABNORMAL HIGH (ref <20.7)

## 2018-09-09 MED ORDER — CARVEDILOL 3.125 MG PO TAB
3.125 mg | ORAL_TABLET | Freq: Two times a day (BID) | ORAL | 1 refills | Status: CN
Start: 2018-09-09 — End: ?

## 2018-09-09 MED ORDER — CEFAZOLIN INJ 1GM IVP
1 g | INTRAVENOUS | 0 refills | Status: CP
Start: 2018-09-09 — End: ?
  Administered 2018-09-10: 02:00:00 1 g via INTRAVENOUS

## 2018-09-09 MED ORDER — LOSARTAN 25 MG PO TAB
12.5 mg | ORAL_TABLET | Freq: Two times a day (BID) | ORAL | 1 refills | Status: CN
Start: 2018-09-09 — End: ?

## 2018-09-09 NOTE — Progress Notes
Pt had CHG bath last night in preparation for pacemaker upgrade this am. Noted this morning while shaving chest and groin area that pt developed skin rashes to his chest, abdomen, upper legs and back. Pt denies itching. Informed MPA on call, Dr Dessa Phi, no new order received. CHG added to patient's allergy list. Informed CTR of pt's allergic reaction to CHG thus second CHG bath was not performed this am. Pt just had a regular shower using regular soap. Clipped hair from chest, shoulders and bilateral groin area.

## 2018-09-09 NOTE — Progress Notes
PHYSICAL THERAPY  NOTE      Name: Logan Bright        MRN: [id number]          DOB: [birth year]          Age: 83 y.o.  Admission Date: 09/04/2018             LOS: 3 days      Physical therapy will defer today.  Pt is scheduled for biventricular ICD upgrade at 1100; physical therapy will reassess on 8/13.  Therapist: Lutricia Feil, Physical therapist assistant  Date: 09/09/2018

## 2018-09-09 NOTE — Progress Notes
0958- Report received from [facility], [place]. Pt will transport to [facility] for CRT-D upgrade.

## 2018-09-09 NOTE — Progress Notes
Staff Cardiology Progress Note      Admission Date: 09/04/2018  Today's Date: 09/09/2018  LOS: 3 days        Assessment & Plan   Logan Bright is a 83 y.o. patient with the following problems:    1.  Acute on chronic combined heart failure  ???Stable at this time, well compensated, exam unremarkable  ??? Continue current medications, I have adjusted Coreg back up to PTA dose, 6.25, continue losartan at reduced dose of 12.5  ??? We will continue PTA diuretic, Lasix 40 daily along with potassium, continue the same at discharge  ???Weight is at/below his typical dry weight      2.  Dual-chamber ICD  ???High RV pacing upon presentation, greater than 80% for past 4 months  ???Adjustments made Sunday lengtheng AV delay to promote intrinsic conduction, unfortunately very long the leg necessary  ???Unfortunately appears he is absolutely RV pacemaker dependent with progressive LV dysfunction  ???Patient going today for upgrade to biventricular ICD, EP following, orders placed  ???Should be stable for discharge tomorrow post device implant    3.  PAF  ??? Minimal A. fib burden on amiodarone, continue same along with anticoagulation, adjusted for renal function Eliquis  ???Eliquis held appropriately for procedure today, none for past 24 hours    4.  CKD 3  ???Stable renal function, at baseline.    4.  CAD  ???Extensive LAD infarct pattern on nuclear stress test 3/20, unchanged from prior study 2014.???Medical management  ???No complaints of angina.  ???Stable    Patient stable at this time, anticipate stability for discharge home tomorrow.  I will ask our rounding team to help coordinate follow-up       Subjective    Patient voices no specific complaints today.  Notes reviewed, concerned about his home situation.  Her daughter will be her only becoming into help out from [place].    Anticipate stability for discharge  Medications  Scheduled Meds:allopurinoL (ZYLOPRIM) tablet 100 mg, 100 mg, Oral, QHS amiodarone (CORDARONE) tablet 200 mg, 200 mg, Oral, QDAY  aspirin EC tablet 81 mg, 81 mg, Oral, QDAY  carvediloL (COREG) tablet 3.125 mg, 3.125 mg, Oral, BID w/meals  ceFAZolin (ANCEF) IVP 2 g, 2 g, Intravenous, ONCE    Followed by  ceFAZolin (ANCEF) IVP 1 g, 1 g, Intravenous, Q8H*  furosemide (LASIX) tablet 40 mg, 40 mg, Oral, QDAY  levothyroxine (SYNTHROID) tablet 50 mcg, 50 mcg, Oral, QDAY 30 min before breakfast  losartan (COZAAR) tablet 12.5 mg, 12.5 mg, Oral, BID  potassium chloride SR (K-DUR) tablet 20 mEq, 20 mEq, Oral, QDAY w/breakfast    Continuous Infusions:  ??? sodium chloride 0.9 %   infusion       PRN and Respiratory Meds:acetaminophen Q6H PRN, lidocaine PF PRN, polyethylene glycol 3350 QDAY PRN, senna/docusate BID PRN      Objective                       Vital Signs: Last Filed                 Vital Signs: 24 Hour Range   BP: 119/68 (08/12 0831)  Temp: 36.4 ???C (97.6 ???F) (08/12 0831)  Pulse: 60 (08/12 0831)  Respirations: 16 PER MINUTE (08/12 0831)  SpO2: 94 % (08/12 0831) BP: (94-119)/(56-79)   Temp:  [36.2 ???C (97.2 ???F)-36.9 ???C (98.4 ???F)]   Pulse:  [59-102]   Respirations:  [16 PER MINUTE-18 PER MINUTE]  SpO2:  [93 %-96 %]      Vitals:    09/07/18 0557 09/08/18 0346 09/09/18 0719   Weight: 88.4 kg (194 lb 14.2 oz) 88 kg (194 lb) 88.7 kg (195 lb 8.8 oz)         Intake/Output Summary:  (Last 24 hours)    Intake/Output Summary (Last 24 hours) at 09/09/2018 0859  Last data filed at 09/09/2018 0425  Gross per 24 hour   Intake 100 ml   Output 900 ml   Net -800 ml           Body mass index is 26.52 kg/m???.    Physical Exam        General Appearance: no acute distress, full a/o  HEENT: EOMI, MM-moist, post OP-clear  Neck Veins: neck veins are flat & not distended  Carotid Arteries: no bruits  Chest Inspection: chest is normal in appearance, ICD left chest  Auscultation/Percussion: lungs clear to auscultation, no rales, rhonchi, or wheezing  Cardiac Rhythm: regular rhythm & normal rate Cardiac Auscultation: Normal S1 & S2, no S3 or S4, no rub  Murmurs: no cardiac murmurs  Abdominal Exam: soft, non-tender, normal bowel sounds, no masses or bruits  Abdominal aorta: nonpalpable   Liver & Spleen: no organomegaly  Extremities: no lower extremity edema; 2+ symmetric distal pulses  Skin: warm & intact  Neurologic Exam: oriented to time, place and person; no focal neurologic deficits          Lab Review  Results for orders placed or performed during the hospital encounter of 09/04/18 (from the past 24 hour(s))   PROTIME INR (PT)    Collection Time: 09/09/18  6:00 AM   Result Value Ref Range    INR 1.4 (H) 0.8 - 1.2   CBC    Collection Time: 09/09/18  6:00 AM   Result Value Ref Range    White Blood Cells 4.1 (L) 4.5 - 11.0 K/UL    RBC 5.28 4.4 - 5.5 M/UL    Hemoglobin 17.3 (H) 13.5 - 16.5 GM/DL    Hematocrit 16.1 40 - 50 %    MCV 94.1 80 - 100 FL    MCH 32.8 26 - 34 PG    MCHC 34.8 32.0 - 36.0 G/DL    RDW 09.6 (H) 11 - 15 %    Platelet Count 134 (L) 150 - 400 K/UL    MPV 9.2 7 - 11 FL   COMPREHENSIVE METABOLIC PANEL    Collection Time: 09/09/18  7:33 AM   Result Value Ref Range    Sodium 134 (L) 137 - 147 MMOL/L    Potassium 4.1 3.5 - 5.1 MMOL/L    Chloride 98 98 - 110 MMOL/L    Glucose 101 (H) 70 - 100 MG/DL    Blood Urea Nitrogen 37 (H) 7 - 25 MG/DL    Creatinine 0.45 (H) 0.4 - 1.24 MG/DL    Calcium 7.8 (L) 8.5 - 10.6 MG/DL    Total Protein 5.6 (L) 6.0 - 8.0 G/DL    Total Bilirubin 0.9 0.3 - 1.2 MG/DL    Albumin 3.0 (L) 3.5 - 5.0 G/DL    Alk Phosphatase 95 25 - 110 U/L    AST (SGOT) 26 7 - 40 U/L    CO2 25 21 - 30 MMOL/L    ALT (SGPT) 32 7 - 56 U/L    Anion Gap 11 3 - 12    eGFR Non African American 43 (L) >60 mL/min  eGFR African American 52 (L) >60 mL/min   MAGNESIUM    Collection Time: 09/09/18  7:33 AM   Result Value Ref Range    Magnesium 2.0 1.6 - 2.6 mg/dL           Jana Half, MD

## 2018-09-09 NOTE — Progress Notes
Heart Failure Nursing Progress Note    Admission Date: 09/04/2018  LOS: 3 days    Admission Weight: 97.5 kg (215 lb)        Most recent weights (inpatient):   Vitals:    09/07/18 0557 09/08/18 0346 09/09/18 0719   Weight: 88.4 kg (194 lb 14.2 oz) 88 kg (194 lb) 88.7 kg (195 lb 8.8 oz)     Weight change from previous day: Gained 0.7 kg     Fluid restriction ordered: Yes    Intake/Output Summary: (Last 24 hours)    Intake/Output Summary (Last 24 hours) at 09/09/2018 [id number]  Last data filed at 09/09/2018 0425  Gross per 24 hour   Intake 100 ml   Output 900 ml   Net -800 ml       Is patient incontinent No    Anticipated discharge date: TBD  Discharge goals: To go home with [facility] after pacemaker upgrade       Daily Assessment of Patient Stated Goals:    Short Term Goal Identified by patient (Short Term=during hospitalization):

## 2018-09-09 NOTE — Progress Notes
Advance Care Planning/Resuscitation Status Conversation        Individuals present for advance care planning conversation:Advanced practice provider and / or EP Physician, the patient and any available family/surrogate decision makers who were present.     Detailed conversation regarding code status for impending Electrophysiology procedure discussed with patient and available family members.  The patient and/or surrogate decision makers state understanding and acceptance of change in code status to FULL for a period of 24 hours post procedure.     Outcome of conversation: Full Code for Electrophysiology procedure and for 24 hours post-operatively.    Order will be placed by EP team member to change code status to FULL just prior to the procedure.  At post op day 1 EP check, patient's prior DNAR status order will be rewritten by an EP team member as appropriate based on patient condition and goals of care.

## 2018-09-09 NOTE — Progress Notes
General Progress Note    Name:  Tomaz Gwynn   Today's Date:  09/09/2018  Admission Date: 09/04/2018  LOS: 3 days                     Assessment/Plan:    Principal Problem:    Acute on chronic systolic heart failure (HCC)  Active Problems:    Ischemic cardiomyopathy    HTN (hypertension)    Dyslipidemia    CKD (chronic kidney disease)    Automatic implantable cardioverter-defibrillator in situ    Recurrent ventricular tachycardia w/ 2 VT ablations 04/2013    Paroxysmal atrial fibrillation (HCC)    Acute on chronic systolic congestive heart failure (HCC)    Hyponatremia    Hypothyroidism    ???  Tae Librizzi is a 83 yo M with a PMHx of HTN, HLD, CAD, Ischemic cardiomyopathy, HFrEF, Hx of VT s/p ICD, Afib, CKD, neurologic cardiac syncope, and cryptogenic stroke who presents to the ED with dyspnea an fatigue and admitted for heart failure exacerbation and left-sided weakness.    Hypotension - resolved  Acute hypoxic respiratory failure, tachypnea - resolved  - concern for unstable bradycardia d/t pacemaker setting change vs hypovolemia d/t diuresis vs sepsis (possible pulmonary source?) vs iatrogenic (coreg increase) vs cardiogenic shock vs unlikely PE with eliquis use  - CXR:1. Mild cardiomegaly with increase in pulmonary venous congestion   suggesting CHF/fluid overload. 2. Persistent right pleural effusion with increase in adjacent consolidation, which may represent atelectasis and/or pneumonia.  - BNP 1256, improved from admission, troponin negative  - SIRS criteria (RR 30 - now improved, lymphopenia)  - no symptoms to suggest infection including PNA, denies pleuritis or cough and no recent concern for aspirations  - ambulating well with PT after episode.   - lactate 2.2 after 250 mL of NS  -Discussed with EP.  Telemetry consistent with possible underlying AV block with concern for exacerbation of bradycardia due to [employer] specific programming and recent setting change.  EP evaluating and potentially will change settings back to PTA programming   PLAN  > avoid further fluid boluses with HFrEF and improvement in BP  > diuresis as below  > appreciate EP and cardiology assistance with pacemaker functioning  > GDMT as below  > Incentive spirometry     Acute on chronic combined systolic and diastolic heart failure, EF 20%  Right ventricular failure   Ischemic cardiomyopathy  Ventricular tachycardia s/p ICD  CAD  - Most recent echo in 2015 with EF of 20% although had Regadenoson MPI on 04/14/18 which showed EF of 18% with extensive areas of prior ischemia but unchanged from prior study\  - ECHO 8/7: EF 20% w/ severe dilatation, new RV failure (severely reduced EF w/severe dilatation), Severely elevated CVP 10-20, Grade II diastolic dysfxn.   - S/p two VT ablations and placement of ICD  - Most recent device interrogation 8/7 with change in settings to reduce RV pacing (100% of the time prior to this)   - Troponin negative, BNP 1,849 on admission --> 1256   - CXR with findings of fluid overload  - PTA regimen: ASA, Eliquis, Coreg 6.25 mg BID, lasix 40 mg, losartan 25 mg BID  > TC???PYP ordered to evaluate for amyloidosis given increased IVS and low voltage EKG - This study is negative and not suggestive of ATTR cardiac amyloidosis.  A negative exam does not exclude AL cardiac amyloidosis  > K/L FLC serum and urine immunofixation WNL  Plan:  > Continue PTA ASA, losartan  > reduce Coreg to 3.125 mg BID with hypotension and bradycardia  > continue appropriately reduced dose of eliqius  > Consult Heart Failure team  > consulted EP per cardiology recommendations, appreciate assistance.   > upgrading pacemaker today, holding eliquis, will need to stay overnight for CXR in AM  ???  Left-sided weakness - resolved  Cryptogenic stroke  - Patient reports being told he had a stroke by an Ophthalmologist but has never had symptoms  - Unaware of left-sided weakness noted on exam  - No neck pain or numbness/tingling - CT head without hemorrhage or evidence of stroke  Plan:  > Consulted Neurology, appreciate recommendations???no further work-up unless symptoms were to recur  ???  Hyponatremia - improving  - Na 130 on admission  - Likely hypervolemic hyponatremia from CHF, improving w/diuresis  Plan:  > Diuresis  ???  Atrial fibrillation  - CHADS2Vasc of at least 4  - PTA regimen: Eliquis and Coreg  Plan:   > Continue Eliquis at reduced dose with age, Cr   > Beta blocker as above  ???  CKD stage III  - Baseline Cr ~ 1.6, at baseline on admission  Plan:  > Monitor  ???  HTN  HLD  > Increase coreg to 12.5 mg BID for hypertension  > Continue PTA losartan    Concern for inability to care for himself d/t undiagnosed dementia  -Discussed with patient's DPOA who has significant concern for patient caring for himself at home.  -Lives at home alone without consistent caregiver  -DPOA reports home appears unlivable and has photo documentation, also has concern for ability to take his medications.  -We will continue discussion with patient and recommend discharge to inpatient setting.  - speech cog - MOCA 16, will need help with finances on discharge, but can likely be responsible for ADLs  PLAN  > patient declining placement as it would be OOP  > Patient's sister to return home on Sunday to assist with    > Patient to return home with home health.  Setting up home health PT/OT including home health social work evaluation as well as feeling members to help clean up house.  ???  FEN: No IVF, replace lytes PRN, cardiac diet  Ppx: Eliquis  Code status: DNAR-FI  Dispo: Continue admit to medicine    Bayard Hugger, MD  Internal Medicine   Available on [facility] and [employer]  Pager [phone]    ________________________________________________________________________    Subjective  Zaylin Dyess is a 83 y.o. male.  Patient again reports he feels well. NAEON. He denies any complaints.  He is ready for procedure.  He is not anxious about it.  He feels like it will go well as well.  He still plan returning home tomorrow.    Medications  Scheduled Meds:[MAR Hold] allopurinoL (ZYLOPRIM) tablet 100 mg, 100 mg, Oral, QHS  amiodarone (CORDARONE) tablet 200 mg, 200 mg, Oral, QDAY  [MAR Hold] aspirin EC tablet 81 mg, 81 mg, Oral, QDAY  carvediloL (COREG) tablet 3.125 mg, 3.125 mg, Oral, BID w/meals  ceFAZolin (ANCEF) IVP 1 g, 1 g, Intravenous, Q8H*  [MAR Hold] furosemide (LASIX) tablet 40 mg, 40 mg, Oral, QDAY  [MAR Hold] levothyroxine (SYNTHROID) tablet 50 mcg, 50 mcg, Oral, QDAY 30 min before breakfast  [MAR Hold] losartan (COZAAR) tablet 12.5 mg, 12.5 mg, Oral, BID  [MAR Hold] potassium chloride SR (K-DUR) tablet 20 mEq, 20 mEq, Oral, QDAY w/breakfast  Continuous Infusions:  ??? sodium chloride 0.9 %   infusion 20 mL/hr at 09/09/18 1113     PRN and Respiratory Meds:[MAR Hold] acetaminophen Q6H PRN, [MAR Hold] lidocaine PF PRN, [MAR Hold] polyethylene glycol 3350 QDAY PRN, [MAR Hold] senna/docusate BID PRN      Review of Systems:  All other systems reviewed and are negative.    Objective:                          Vital Signs: Last Filed                 Vital Signs: 24 Hour Range   BP: 118/73 (08/12 1058)  Temp: 36.5 ???C (97.7 ???F) (08/12 1058)  Pulse: 60 (08/12 1058)  Respirations: 18 PER MINUTE (08/12 1058)  SpO2: 92 % (08/12 1058)  Height: 182.9 cm (72) (08/12 1058) BP: (94-119)/(56-79)   Temp:  [36.3 ???C (97.3 ???F)-36.9 ???C (98.4 ???F)]   Pulse:  [59-64]   Respirations:  [16 PER MINUTE-18 PER MINUTE]   SpO2:  [92 %-96 %]    Intensity Pain Scale (Self Report): 0 (09/09/18 0831) Vitals:    09/08/18 0346 09/09/18 0719 09/09/18 1058   Weight: 88 kg (194 lb) 88.7 kg (195 lb 8.8 oz) 86.2 kg (190 lb)       Intake/Output Summary:  (Last 24 hours)    Intake/Output Summary (Last 24 hours) at 09/09/2018 1448  Last data filed at 09/09/2018 6295  Gross per 24 hour   Intake 100 ml   Output 700 ml   Net -600 ml      Stool Occurrence: 1    Physical Exam General appearance: alert, well-developed, well-nourished, cooperative and no distress  Head: Normocephalic, without obvious abnormality, atraumatic  Eyes: negative findings: conjunctivae and sclerae normal, corneas clear and pupils equal, round, reactive to light and accomodation  Throat: Lips, mucosa, and tongue normal. Teeth and gums normal  Neck: supple, symmetrical, trachea midline, no adenopathy no JVD noted  Lungs: Crackles in bases but otherwise clear to auscultation bilaterally  Chest wall: ICD noted in left upper chest  Heart: irregular bradycardia, S1, S2 normal, no murmur  Abdomen: soft, non-tender. Bowel sounds normal. No masses,  no organomegaly  Extremities: edema 1+ improved pitting bilateral lower extremities to knee with compression stockings present  Neurologic: A&Ox4. CN's II-XII intact.      Lab Review   24-hour labs:    Results for orders placed or performed during the hospital encounter of 09/04/18 (from the past 24 hour(s))   PROTIME INR (PT)    Collection Time: 09/09/18  6:00 AM   Result Value Ref Range    INR 1.4 (H) 0.8 - 1.2   CBC    Collection Time: 09/09/18  6:00 AM   Result Value Ref Range    White Blood Cells 4.1 (L) 4.5 - 11.0 K/UL    RBC 5.28 4.4 - 5.5 M/UL    Hemoglobin 17.3 (H) 13.5 - 16.5 GM/DL    Hematocrit 28.4 40 - 50 %    MCV 94.1 80 - 100 FL    MCH 32.8 26 - 34 PG    MCHC 34.8 32.0 - 36.0 G/DL    RDW 13.2 (H) 11 - 15 %    Platelet Count 134 (L) 150 - 400 K/UL    MPV 9.2 7 - 11 FL   COMPREHENSIVE METABOLIC PANEL    Collection Time: 09/09/18  7:33 AM   Result  Value Ref Range    Sodium 134 (L) 137 - 147 MMOL/L    Potassium 4.1 3.5 - 5.1 MMOL/L    Chloride 98 98 - 110 MMOL/L    Glucose 101 (H) 70 - 100 MG/DL    Blood Urea Nitrogen 37 (H) 7 - 25 MG/DL    Creatinine 1.61 (H) 0.4 - 1.24 MG/DL    Calcium 7.8 (L) 8.5 - 10.6 MG/DL    Total Protein 5.6 (L) 6.0 - 8.0 G/DL    Total Bilirubin 0.9 0.3 - 1.2 MG/DL    Albumin 3.0 (L) 3.5 - 5.0 G/DL    Alk Phosphatase 95 25 - 110 U/L AST (SGOT) 26 7 - 40 U/L    CO2 25 21 - 30 MMOL/L    ALT (SGPT) 32 7 - 56 U/L    Anion Gap 11 3 - 12    eGFR Non African American 43 (L) >60 mL/min    eGFR African American 52 (L) >60 mL/min   MAGNESIUM    Collection Time: 09/09/18  7:33 AM   Result Value Ref Range    Magnesium 2.0 1.6 - 2.6 mg/dL       Point of Care Testing  (Last 24 hours)  Glucose: (!) 101 (09/09/18 0960)    Radiology and other Diagnostics Review:    Pertinent radiology reviewed.    Kerney Elbe, MD   Pager

## 2018-09-09 NOTE — Progress Notes
During patient assessment, it was noted that the patient's toe nails were very long. He is currently at risk for the nails cutting into the skin if toenails are not trimmed in the near future. Patient notes he does not have a podiatry or "foot doctor" anymore because the pervious provider stopped seeing patients.    RN reached out to [employer] with Social Work and she noted there is not normally a lot they can do for inpatient stay or outpatient referrals, but she can "hotline" the patient for neglect (either self inflicted or neglect from others) and will alert the medical team.     No other concerns at this time, will continue to monitor.

## 2018-09-10 ENCOUNTER — Encounter: Admit: 2018-09-07 | Discharge: 2018-09-07

## 2018-09-10 ENCOUNTER — Encounter: Admit: 2018-09-10 | Discharge: 2018-09-10 | Primary: Cardiovascular Disease

## 2018-09-10 ENCOUNTER — Observation Stay: Admit: 2018-09-04 | Discharge: 2018-09-04 | Primary: Cardiovascular Disease

## 2018-09-10 ENCOUNTER — Encounter: Admit: 2018-09-08 | Discharge: 2018-09-08

## 2018-09-10 ENCOUNTER — Encounter: Admit: 2018-09-06 | Discharge: 2018-09-06

## 2018-09-10 ENCOUNTER — Encounter: Admit: 2018-09-10 | Discharge: 2018-09-10

## 2018-09-10 ENCOUNTER — Emergency Department: Admit: 2018-09-04 | Discharge: 2018-09-04

## 2018-09-10 ENCOUNTER — Encounter: Admit: 2018-09-09 | Discharge: 2018-09-09

## 2018-09-10 DIAGNOSIS — Z7982 Long term (current) use of aspirin: Secondary | ICD-10-CM

## 2018-09-10 DIAGNOSIS — R6510 Systemic inflammatory response syndrome (SIRS) of non-infectious origin without acute organ dysfunction: Secondary | ICD-10-CM

## 2018-09-10 DIAGNOSIS — I5043 Acute on chronic combined systolic (congestive) and diastolic (congestive) heart failure: Secondary | ICD-10-CM

## 2018-09-10 DIAGNOSIS — I472 Ventricular tachycardia: Secondary | ICD-10-CM

## 2018-09-10 DIAGNOSIS — Z95810 Presence of automatic (implantable) cardiac defibrillator: Secondary | ICD-10-CM

## 2018-09-10 DIAGNOSIS — I480 Paroxysmal atrial fibrillation: Secondary | ICD-10-CM

## 2018-09-10 DIAGNOSIS — N179 Acute kidney failure, unspecified: Secondary | ICD-10-CM

## 2018-09-10 DIAGNOSIS — Z8673 Personal history of transient ischemic attack (TIA), and cerebral infarction without residual deficits: Secondary | ICD-10-CM

## 2018-09-10 DIAGNOSIS — Z1159 Encounter for screening for other viral diseases: Secondary | ICD-10-CM

## 2018-09-10 DIAGNOSIS — Z79899 Other long term (current) drug therapy: Secondary | ICD-10-CM

## 2018-09-10 DIAGNOSIS — I2510 Atherosclerotic heart disease of native coronary artery without angina pectoris: Secondary | ICD-10-CM

## 2018-09-10 DIAGNOSIS — J9601 Acute respiratory failure with hypoxia: Secondary | ICD-10-CM

## 2018-09-10 DIAGNOSIS — Z66 Do not resuscitate: Secondary | ICD-10-CM

## 2018-09-10 DIAGNOSIS — Z955 Presence of coronary angioplasty implant and graft: Secondary | ICD-10-CM

## 2018-09-10 DIAGNOSIS — Z7901 Long term (current) use of anticoagulants: Secondary | ICD-10-CM

## 2018-09-10 DIAGNOSIS — I255 Ischemic cardiomyopathy: Secondary | ICD-10-CM

## 2018-09-10 DIAGNOSIS — N183 Chronic kidney disease, stage 3 (moderate): Secondary | ICD-10-CM

## 2018-09-10 DIAGNOSIS — I5082 Biventricular heart failure: Secondary | ICD-10-CM

## 2018-09-10 DIAGNOSIS — I5023 Acute on chronic systolic (congestive) heart failure: Principal | ICD-10-CM

## 2018-09-10 DIAGNOSIS — E871 Hypo-osmolality and hyponatremia: Secondary | ICD-10-CM

## 2018-09-10 DIAGNOSIS — E039 Hypothyroidism, unspecified: Secondary | ICD-10-CM

## 2018-09-10 DIAGNOSIS — I130 Hypertensive heart and chronic kidney disease with heart failure and stage 1 through stage 4 chronic kidney disease, or unspecified chronic kidney disease: Secondary | ICD-10-CM

## 2018-09-10 DIAGNOSIS — I959 Hypotension, unspecified: Secondary | ICD-10-CM

## 2018-09-10 DIAGNOSIS — I495 Sick sinus syndrome: Secondary | ICD-10-CM

## 2018-09-10 DIAGNOSIS — I252 Old myocardial infarction: Secondary | ICD-10-CM

## 2018-09-10 DIAGNOSIS — E785 Hyperlipidemia, unspecified: Secondary | ICD-10-CM

## 2018-09-10 MED ORDER — MELATONIN 5 MG PO TAB
5 mg | Freq: Every evening | ORAL | 0 refills | Status: DC | PRN
Start: 2018-09-10 — End: 2018-09-10
  Administered 2018-09-10: 06:00:00 5 mg via ORAL

## 2018-09-10 MED ORDER — APIXABAN 2.5 MG PO TAB
2.5 mg | ORAL_TABLET | Freq: Two times a day (BID) | ORAL | 1 refills | Status: DC
Start: 2018-09-10 — End: 2018-12-11

## 2018-09-10 MED ORDER — LOSARTAN 25 MG PO TAB
12.5 mg | ORAL_TABLET | Freq: Two times a day (BID) | ORAL | 1 refills | 90.00000 days | Status: DC
Start: 2018-09-10 — End: 2018-09-24

## 2018-09-10 MED ORDER — APIXABAN 2.5 MG PO TAB
2.5 mg | Freq: Two times a day (BID) | ORAL | 0 refills | Status: DC
Start: 2018-09-10 — End: 2018-09-10
  Administered 2018-09-10: 16:00:00 2.5 mg via ORAL

## 2018-09-10 MED ORDER — CARVEDILOL 6.25 MG PO TAB
6.25 mg | Freq: Two times a day (BID) | ORAL | 0 refills | Status: DC
Start: 2018-09-10 — End: 2018-09-10

## 2018-09-10 MED ORDER — APIXABAN 5 MG PO TAB
5 mg | Freq: Two times a day (BID) | ORAL | 0 refills | Status: DC
Start: 2018-09-10 — End: 2018-09-10

## 2018-09-10 MED ORDER — CARVEDILOL 6.25 MG PO TAB
6.25 mg | ORAL_TABLET | Freq: Two times a day (BID) | ORAL | 1 refills | 90.00000 days | Status: DC
Start: 2018-09-10 — End: 2018-09-30

## 2018-09-10 NOTE — Progress Notes
I have reviewed the notes, assessment, and/or procedures performed by Lethea Killings, RN and concur with his documentation unless otherwise noted.

## 2018-09-10 NOTE — Discharge Planning (AHS/AVS)
[address], [place] [address]         Phone: [phone]       Fax: [phone]        [facility] will be managing your home health needs.

## 2018-09-10 NOTE — Progress Notes
EP Note:    The patient is awake and alert.  Denies complaints overnight.  Silverlon dressing remains intact.  Minimal pocket edema with edges of device easily palpated.  The dressing will remain in place until 7-10 day incision check. Patient is not capable of performing incision check virtually. He lives alone with no family support.  He states a neighbor can drive him to f/u appt.    CXR reveals appropriate lead placement without evidence of pneumothorax.    Device interrogation reveals normal pacing, sensing, and threshold testing.    The patient is stable from an EP standpoint.    Further post op instructions and outpatient follow up are placed in O2.  PLEASE REVIEW RESTRICTIONS (HIGHLIGHT) PRIOR TO DISCHARGE. Patient actively raised his left arm above his head when I saw him this morning.    EP will sign off. Please call with any questions or concerns.      Caroline More, APRN-C [phone])

## 2018-09-10 NOTE — Progress Notes
Staff Cardiology Progress Note      Admission Date: 09/04/2018  Today's Date: 09/10/2018  LOS: 4 days        Assessment & Plan   Logan Bright is a 83 y.o. patient with the following problems:    1.  Acute on chronic combined heart failure  ???Stable at this time, well compensated, exam unremarkable  ??? Continue current medications, I have adjusted Coreg back up to PTA dose, 6.25, continue losartan at reduced dose of 12.5  ???Blood pressure has been reasonable over past 24 hours, 110???120 over 60s???70s, should tolerate the higher dose of Coreg  ??? We will continue PTA diuretic, Lasix 40 daily along with potassium, continue the same at discharge  ???Weight is at/below his typical dry weight, exam is normal  ???Hopefully will derive benefit from upgrade to biventricular ICD      2.  Dual-chamber ICD  ???High RV pacing upon presentation, greater than 80% for past 4 months  ???Adjustments made Sunday lengtheng AV delay to promote intrinsic conduction, unfortunately very long the leg necessary  ???Now status post biventricular upgrade 8/12, site looks fine, no hematoma  ??? Spoke with [employer] device rep in the room, device check is normal  ??? Will need incision check in 7 days, will coordinate at the [place] office, will be on discharge paperwork  ???Chest x-ray looks fine, no pneumothorax  ??? Resuming Eliquis today as below  ??? Okay to feed, diet order placed    3.  PAF  ??? Minimal A. fib burden on amiodarone, continue same along with anticoagulation, adjusted for renal function Eliquis  ???Eliquis held appropriately for procedure resuming today    4.  CKD 3  ???Stable renal function, at baseline.    4.  CAD  ???Extensive LAD infarct pattern on nuclear stress test 3/20, unchanged from prior study 2014.???Medical management  ???No complaints of angina.  ???Stable    Patient stable for discharge from my standpoint, will sign off at this time       Subjective    Patient feeling well today, did well with procedure, no pain over left shoulder.  No acute events overnight, slept well, no dyspnea, no chest pain, eager for breakfast this morning.    Medications  Scheduled Meds:allopurinoL (ZYLOPRIM) tablet 100 mg, 100 mg, Oral, QHS  amiodarone (CORDARONE) tablet 200 mg, 200 mg, Oral, QDAY  aspirin EC tablet 81 mg, 81 mg, Oral, QDAY  carvediloL (COREG) tablet 3.125 mg, 3.125 mg, Oral, BID w/meals  furosemide (LASIX) tablet 40 mg, 40 mg, Oral, QDAY  levothyroxine (SYNTHROID) tablet 50 mcg, 50 mcg, Oral, QDAY 30 min before breakfast  losartan (COZAAR) tablet 12.5 mg, 12.5 mg, Oral, BID  potassium chloride SR (K-DUR) tablet 20 mEq, 20 mEq, Oral, QDAY w/breakfast    Continuous Infusions:  ??? sodium chloride 0.9 %   infusion 20 mL/hr at 09/09/18 1113     PRN and Respiratory Meds:acetaminophen Q6H PRN, lidocaine PF PRN, melatonin QHS PRN, polyethylene glycol 3350 QDAY PRN, senna/docusate BID PRN      Objective                       Vital Signs: Last Filed                 Vital Signs: 24 Hour Range   BP: 111/72 (08/13 0435)  Temp: 37.1 ???C (98.7 ???F) (08/13 0435)  Pulse: 61 (08/13 0435)  Respirations: 18 PER MINUTE (08/13 0435)  SpO2: 95 % (  08/13 0435)  SpO2 Pulse: 60 (08/12 1600)  Height: 182.9 cm (6') (08/12 1058) BP: (110-128)/(65-76)   Temp:  [36.2 ???C (97.2 ???F)-37.1 ???C (98.7 ???F)]   Pulse:  [60-61]   Respirations:  [17 PER MINUTE-24 PER MINUTE]   SpO2:  [89 %-97 %]    Intensity Pain Scale (Self Report): 0 (09/10/18 0343) Vitals:    09/09/18 0719 09/09/18 1058 09/10/18 0645   Weight: 88.7 kg (195 lb 8.8 oz) 86.2 kg (190 lb) 87 kg (191 lb 12.8 oz)         Intake/Output Summary:  (Last 24 hours)    Intake/Output Summary (Last 24 hours) at 09/10/2018 0841  Last data filed at 09/10/2018 0435  Gross per 24 hour   Intake 265 ml   Output 1800 ml   Net -1535 ml           Body mass index is 26.01 kg/m???.    Physical Exam        General Appearance: no acute distress, full a/o  HEENT: EOMI, MM-moist, post OP-clear  Neck Veins: neck veins are flat & not distended Carotid Arteries: no bruits  Chest Inspection: chest is normal in appearance, ICD left chest, dressing in place  Auscultation/Percussion: lungs clear to auscultation, no rales, rhonchi, or wheezing  Cardiac Rhythm: regular rhythm & normal rate  Cardiac Auscultation: Normal S1 & S2, no S3 or S4, no rub  Murmurs: no cardiac murmurs  Abdominal Exam: soft, non-tender, normal bowel sounds, no masses or bruits  Abdominal aorta: nonpalpable   Liver & Spleen: no organomegaly  Extremities: no lower extremity edema; 2+ symmetric distal pulses  Skin: warm & intact  Neurologic Exam: oriented to time, place and person; no focal neurologic deficits             Jana Half, MD

## 2018-09-10 NOTE — Care Plan
Patient discharged.

## 2018-09-10 NOTE — Discharge Planning (AHS/AVS)
Heart Failure-Medicine Information    What is heart failure?  Several things can happen when you have heart failure. The heart may not pump blood to the rest of the body as well as it should or it may not be able to relax as well as it should. Below are some of the medications you may start taking if you have heart failure.    Beta-blockers (examples: carvedilol, metoprolol XL, bisoprolol)  These medicines help reduce heart rate and blood pressure. Taking a beta-blocker long-term may reduce the harmful effects of heart failure and may stop it from getting worse.  ? Possible side effects: Fatigue, low blood pressure, dizziness, feeling lightheaded  ? How to take: Take carvedilol (Coreg) with food. All others may be taken without regards to meals    Angiotensin-converting enzyme inhibitors (ACE inhibitors??? examples: lisinopril, ramipril, fosinopril) or Angiotensin-receptor blockers (???ARBs??? examples: losartan, valsartan, olmesartan)   They help to decrease blood pressure and block certain hormones that can put stress on the heart. Like beta-blockers, taking an ACE inhibitor or an ARB may reduce the harmful effects of heart failure and may prevent heart failure from getting worse.  ? Possible side effects: Dry cough, low blood pressure, dizziness  ? How to take: Take captopril and moexipril on an empty stomach. All others may be taken without regards to meals.     Vasodilators (examples: hydralazine, nitrates)  These medicines open up blood vessels in the body. This makes it easier for the heart to pump blood. These medicines improve heart failure symptoms and may prevent your heart failure from getting worse. Hydralazine and a nitrate may be used when an ACE inhibitor or ARB cannot be taken.  ? Possible side effects: Headache, dizziness, low blood pressure  ? How to take: Do not crush or chew. Imdur and Isordil may be cut in half. Take with a full glass of water    Digoxin Digoxin is a medicine that helps the heart pump. It can help patients with heart failure stay out of the hospital. It may also help improve heart failure symptoms.  ? Possible side effects: nausea, vomiting, blurred vision, low heart rate, dizziness  ? How to take: Take without regards to meals.    Diuretics (examples: furosemide, bumetanide, torsemide)  Also known as water pills,??? these medications help to rid the body of extra water and salt. They can also reduce swelling. Diuretics make it easier for your heart to pump, because there is less fluid in your body that the heart has to pump.  ? Possible side effects: Increased urination, headache, muscle cramps, dizziness, photosensitivity  ? How to take: Take early in the day, with or without food.     Aldosterone antagonists (examples: spironolactone, eplerenone)  Aldosterone antagonists work by blocking certain hormones that can put stress on the heart.  ? Possible side effects: rash, muscle cramps, dizziness, enlarged or tender breasts  ? How to take: Spironolactone should be taken with food to increase absorption. Eplerenone can be taken without regards to meals.

## 2018-09-10 NOTE — Patient Education
On 09/10/2018, Logan Bright was counseled and provided with a list of his discharge medications as part of his After Visit Summary.  The patient was instructed to bring this medication list to his next doctor's appointment and to update the list with any medication changes.  Where indicated, the patient was provided with additional medication and/or disease-state information regarding heart failure.    All patient questions were answered and patient acknowledged understanding of the medications, side effects, and other pertinent medication information.    Rutha Bouchard, [facility]  09/10/2018

## 2018-09-10 NOTE — Progress Notes
I have reviewed the notes, assessment, and/or procedures performed by Haylee Beckley,RN and concur with her documentation unless otherwise noted.

## 2018-09-10 NOTE — Progress Notes
Logan Bright "Logan Bright" Jha discharged on 09/10/2018.   Logan Bright  Discharge instructions reviewed with patient.  Valuables returned:   Shoes, sweat pants.  Home medications:    None.  Functional assessment at discharge complete: No.

## 2018-09-10 NOTE — Progress Notes
Heart Failure Nursing Progress Note    Admission Date: 09/04/2018  LOS: 4 days    Admission Weight: 97.5 kg (215 lb)        Most recent weights (inpatient):   Vitals:    09/09/18 0719 09/09/18 1058 09/10/18 0645   Weight: 88.7 kg (195 lb 8.8 oz) 86.2 kg (190 lb) 87 kg (191 lb 12.8 oz)     Weight change from previous day: 2.5 kg    Fluid restriction ordered: 1.5 L per day    Intake/Output Summary: (Last 24 hours)    Intake/Output Summary (Last 24 hours) at 09/10/2018 [id number]  Last data filed at 09/10/2018 0435  Gross per 24 hour   Intake 265 ml   Output 1800 ml   Net -1535 ml       Is patient incontinent No    Anticipated discharge date: 8/13  Discharge goals: Go home with home health.      Daily Assessment of Patient Stated Goals:    Short Term Goal Identified by patient (Short Term=during hospitalization):  Daily weight and diet education.

## 2018-09-10 NOTE — Progress Notes
0040~ Pt is c/o unable to sleep. He had not a good night sleep for about 3 nights now. Informed Dr. Lucile Shutters order for Melatonin 5 mg po prn.

## 2018-09-11 ENCOUNTER — Encounter: Admit: 2018-09-11 | Discharge: 2018-09-11 | Primary: Cardiovascular Disease

## 2018-09-11 NOTE — Telephone Encounter
Patient Discharge Date from hospital: 09/10/18  Date Call Attempted: 09/11/18  Number of Attempts: 2  Date Call Completed:  09/11/18    Next Appointment    Next Follow-up Appointment on 09/16/18 at 62 for nurse visit (incision check) at our [facility] clinic. Next OV with Dr. Ricard Dillon 09/24/18 at 1015 at our [place] clinic.    Transportation    Yes     [place]    Medications    Pt report he has all medications and is taking as instructed. Pt has no questions at this time.      Diet    200 mg cholesterol, 2 G Na, 1.5 L fluid restriction      Scale/Weight    Yes, no weight yet today. Education provided.    Signs and Symptoms    Pt reports the following symptoms:     Pt reports BLE edema is better since hospitalization. Pt denies SOA. No pain, swelling, redness, drainage note to margin of L chest dressing. No fever.    Pt able to verbalize signs and symptoms, when to contact a provider and when to seek immediate assistance at the ER with cueing .    Intervention(s)    Pt educated on the importance of weighing daily first thing in the morning after voiding using the same scale in the same location and write results down in note pad or log. Notify us for weight gains of 3 lbs in one day or 5 lbs in one week. Notify us for increased SOA.  Notify us for swelling or increased swelling in BLE or abdominal fullness/bloating. Call 911 for sudden, severe chest/pain pressure/SOA develops. Be sure to make your follow up appointment and bring your weight logs, B/P logs, medication list with you to your appointment. Call us at [phone] if you have any questions.         Plan of Care    Continued education needed for heart failure symptom management and when to contact our office.

## 2018-09-11 NOTE — Telephone Encounter
Patient Discharge Date from hospital: 09/10/18  Date Call Attempted: 09/11/18  Number of Attempts: 1  Date Call Completed:      Call placed to pt for post hospital call. No answer on home phone. Noted in last MD note pt will be staying with friend, Coralyn Helling. Call placed to [name] and LM to have pt call us back.    Next Appointment    Next Follow-up Appointment on 09/16/18 at 1000 for nurse visit (incision check) at our [facility] office. Next OV 09/24/18 at 1015 with Dr. Ricard Dillon in our [place] clinic.    Transportation         Home Health    [facility]    Medications          Diet    200 mg cholesterol, 2 G Na, 1.5 L fluid restriction      Scale/Weight        Signs and Symptoms        Intervention(s)          Plan of Care    Continued education needed for heart failure symptom management and when to contact our office.

## 2018-09-12 NOTE — Discharge Instructions - Pharmacy
Discharge Summary      Name: Logan Bright  MRN: [id number]  Date Of Birth: [birth year]  Age:  83 years   Admit date:  09/04/2018                     Discharge date:  09/10/2018    Discharge Attending:  Bayard Hugger, MD  Discharge Summary Completed By: Kerney Elbe, MD    Service: Med Private A [phone]    Reason for hospitalization:  Acute on chronic systolic heart failure [facility]) [I50.23]    Primary Discharge Diagnosis:   Acute on chronic systolic heart failure [facility])      Hospital Diagnoses:  Hospital Problems        Active Problems    * (Principal) Acute on chronic systolic heart failure (HCC)    Ischemic cardiomyopathy    HTN (hypertension)    Dyslipidemia    CKD (chronic kidney disease)    Automatic implantable cardioverter-defibrillator in situ    Recurrent ventricular tachycardia w/ 2 VT ablations 04/2013    Paroxysmal atrial fibrillation (HCC)    Acute on chronic systolic congestive heart failure (HCC)    Hyponatremia    Hypothyroidism        Significant Past Medical History        Arthritis  CAD (coronary artery disease)  Dyslipidemia  Fracture      Comment:  RT ANKLE  History of ventricular tachycardia  HTN (hypertension)  ICD (implantable cardiac defibrillator) in place  Ischemic cardiomyopathy  Neurologic cardiac syncope  Renal insufficiency      Comment:  mild  S/P coronary artery stent placement    Allergies   Chlorhexidine gluconate and Colchicine    Brief Hospital Course   The patient was admitted and the following issues were addressed during this hospitalization: (with pertinent details including admission exam/imaging/labs).   Logan Bright is a 83 yo M with a PMHx of HTN, HLD, CAD, Ischemic cardiomyopathy, HFrEF, Hx of VT s/p ICD, Afib, CKD, neurologic cardiac syncope,???and???cryptogenic stroke who presents to the ED with dyspnea an fatigue and admitted for heart failure exacerbation and left-sided weakness.    Acute on chronic combined systolic and diastolic heart failure, EF 20% Right ventricular failure   Ischemic cardiomyopathy  Ventricular tachycardia s/p ICD  CAD  Heart failure consulted on admission. Patient diuresed until euvolemia. Pacemaker interrogated with new RV failure and found to have 100% RV pacing. EP cardiology consulted and changed patient's pacemaker settings to try and reduce RV pacing. Unfortunately with more physiologic settings, patient had an episode of Wenkebach which with the pacemaker settings possibly contributed to an episode of hypotension that resolved with a small bolus of fluids. Patient's settings changed back to previous. So as to improve CO, EP felt upgrading pacemaker to biventricular pacemaker would be beneficial. Pacemaker upgradted to BiV pacer on 8/12. Tolerated well without complications. Kept overnight for monitoring. Morning CXR without pneumo and pacemaker evaluation within normal limits.  Patient discharged with GDMT and diuretic regimen as below.  - Patient also had a TC-PYP scan and amyloid workup to evalaute for possibly ATTR amyloidosis which was negative.     ???  Left-sided weakness - resolved  Cryptogenic stroke  - Patient reports being told he had a stroke by an Ophthalmologist but has never had symptoms. Patient had concern for L sided weakness on admission. None when evaluated by neurology. Recommended holding on further diagnostic imaging unless recurrence of weakness.    Hypervolemic  hyponatremia resolved with diuresis    Concern for inability to care for himself d/t undiagnosed dementia  -Discussed with patient's DPOA who has significant concern for patient caring for himself at home.   -Lives at home alone without consistent caregiver  -DPOA reports home appears unlivable and worries it is not a safe environement, also has concern for ability to take his medications.  -We will continue discussion with patient and recommend discharge to inpatient setting. PT/OT felt safe for him and patient declined OOP placement. - speech cognitive eval - MOCA 16, will need help with finances on discharge, but can likely be responsible for ADLs  PLAN  > patient declining placement as it would be OOP. Patient has capacity to make this decision.  > Patient's sister to return home on Sunday to assist with    > Patient to return home with home health.  Setting up home health PT/OT including home health social work evaluation as well as having family members to help clean up house.        Items Needing Follow Up   Pending items or areas that need to be addressed at follow up:   - None    Pending Labs and Follow Up Radiology     Pending Labs     Order Current Status    BLUE TOP TUBE In process    LAVENDER TOP TUBE In process    MINT TOP TUBE In process            Medications      Medication List      CHANGE how you take these medications    ??? apixaban 2.5 mg tablet; Commonly known as: ELIQUIS; Dose: 2.5 mg; Take   one tablet by mouth twice daily.; Quantity: 60 tablet; Refills: 1; What   changed: medication strength, how much to take  ??? carvediloL 6.25 mg tablet; Commonly known as: COREG; Dose: 6.25 mg; Take   one tablet by mouth twice daily with meals. Take with food.; Quantity: 180   tablet; Refills: 1; What changed: additional instructions  ??? losartan 25 mg tablet; Commonly known as: COZAAR; Dose: 12.5 mg; Take   one-half tablet by mouth twice daily.; Quantity: 90 tablet; Refills: 1;   What changed: how much to take     CONTINUE taking these medications    ??? allopurinoL 100 mg tablet; Commonly known as: ZYLOPRIM; TAKE 1 TABLET BY   MOUTH DAILY AT BEDTIME; Quantity: 90 tablet; Refills: 3  ??? amiodarone 200 mg tablet; Commonly known as: CORDARONE; Dose: 200 mg;   Doctor's comments: **Patient requests 90 days supply**; Take one tablet by   mouth daily. Take with food.; Quantity: 90 tablet; Refills: 3  ??? aspirin EC 81 mg tablet; Dose: 81 mg; Refills: 0  ??? furosemide 40 mg tablet; Commonly known as: LASIX; Dose: 40 mg; TAKE 1 TABLET BY MOUTH DAILY; Quantity: 90 tablet; Refills: 3  ??? levothyroxine 50 mcg tablet; Commonly known as: SYNTHROID; Dose: 50 mcg;   Take one tablet by mouth daily 30 minutes before breakfast.; Quantity: 90   tablet; Refills: 3  ??? potassium chloride 10 mEq tablet; Commonly known as: KLOR-CON; TAKE 1   TABLET BY MOUTH TWICE DAILY WITH A MEAL AND A FULL GLASS OF WATER;   Quantity: 180 tablet; Refills: 3       Return Appointments and Scheduled Appointments     Scheduled appointments:    Sep 16, 2018 10:00 AM CDT  Nurse Visit with [facility]  Cardiology: [facility] (CVM Exam) [address] [place] [address]  [phone]   Sep 24, 2018 10:15 AM CDT  Return Patient with Vanice Sarah, MD  Cardiology: [facility] Exam) [address]  [address]  [place] [address]  [phone]          Consults, Procedures, Diagnostics, Micro, Pathology   Consults: Cardiology and Cardiology - EP  Surgical Procedures & Dates: Pacemaker upgrade to biventricular pacemaker on 8/12  Significant Diagnostic Studies, Micro and Procedures: noted in brief hospital course  Significant Pathology: none  Nutrition: Dietitian Documentation                                         Discharge Disposition, Condition   Patient Disposition: Home with Home Health Care  Condition at Discharge: Stable    Code Status     Code Status History     Date Active Date Inactive Code Status Order ID          09/09/2018 0824 09/10/2018 1741 Full Code [id number]  Logan Bright Inpatient    09/04/2018 1436 09/09/2018 0824 DNAR-Full Intervention [id number]  Logan Janus, MD Inpatient    Only showing the last 2 code statuses.          Patient Instructions        Cardiac Diet    Limiting unhealthy fats and cholesterol is the most important step you can take in reducing your risk for cardiovascular disease.  Unhealthy fats include saturated and trans fats.  Monitor your sodium and cholesterol intake.  Restrict your sodium to 2g (grams) or 2000mg  (milligrams) daily, and your cholesterol to 200mg  daily.    If you have questions regarding your diet at home, you may contact a dietitian at [phone].       Testing Not Required for Covid-19     DISCHARGE WOUND CARE    You may shower after 2-3 days, however try to keep your incision clean and DRY until dressing removed at follow up incision check appointment. Take sponge baths working around the incision during this time or if showering, cover with a towel or dressing with your incision facing away from the shower head.    You may shower once dressing removed at follow up appointment; however avoid direct contact with the incision (allow the water to hit the back of your shoulder rather than directly on the incision).    Do not submerge incision in tub, pool, hot tub, or lake for 4 weeks.    Unless your incision is bleeding or draining, keep it open to air.    Avoid applying deodorants, powders, creams, lotions, etc. to your incision for 4 weeks.    Usually there are no stitches to be removed. Steri-strips will begin to fall off in 10-14 days. If they remain after 2 weeks, gently remove them when they are damp after a shower.    Your incision should gradually look better each day. Please notify our office immediately if you notice any of the following:   -an increase in swelling or redness   -any drainage   -increasing pain at the incision site  -fever over 100 degrees     May resume normal activity in:    Do not raise your elbow higher than shoulder level on the affected side for the first 4 weeks. It is important  to continue to move your shoulder joint gently each day to prevent stiffening or freezing of the joint.     You can typically resume driving 5 days after surgery. There may be special considerations if you passed out before your device was implanted.     If you are an active sleeper, you should use your sling at night until the restrictions on your affected arm are lifted.    No pushing, pulling, or lifting greater than 10 - 15 pounds for one month with the affected arm.    You should avoid bowling, swimming, golfing or swinging a baseball bat with the affected arm for 90 days.    Avoid any activity/exercise which involves rough contact with device site or might cause a heavy blow to the skin over the device.    You may participate in sexual activity in 1 week; however avoid placing all your weight on arms for 4 weeks.     You may return to work in 3-5 days unless instructed otherwise. This will vary with your occupation, age, and overall physical condition.     Report These Signs and Symptoms    Please contact your doctor if you have any of the following symptoms: Incisional bleeding, oozing, or drainage. Any increased swelling, warmth or pain to incision site.     Risk Reduction Plan    Cardiac Event Personal Risk Factor Reduction Plan  **Take this sheet to your physician to show treatment recommendations**  Logan Bright  Admission Date: 09/04/2018 LOS: @LOS @       Blood Pressure Risk  Goal: Keep blood pressure below 130/80  Your Numbers:  BP Readings from Last 1 Encounters:  09/10/18 : 130/80     Plan: High blood pressure is the single most important risk factor for cardiac events because it's the #1 cause of cardiac events.  Take medication as prescribed and monitor your blood pressure.    Abnormal lipids (fats in blood) Risk   Goal: Total Cholesterol: <200  LDL (bad cholesterol): primary prevention <100   LDL (bad cholesterol): secondary prevention < 70  HDL (good cholesterol): >40 for men, >50 for women  Triglycerides: <150  Your Numbers: Cholesterol       Date                     Value               Ref Range           Status                09/05/2018               88                  <200 MG/DL          Final            ----------  LDL       Date                     Value               Ref Range           Status 09/05/2018               48                  <100 mg/dL  Final            ----------  HDL       Date                     Value               Ref Range           Status                09/05/2018               31 (L)              >40 MG/DL           Final            ----------  Triglycerides       Date                     Value               Ref Range           Status                09/05/2018               80                  <150 MG/DL          Final            ----------  Plan: Diets high in saturated fat, trans fat and cholesterol can raise blood cholesterol levels increasing your risk of having a cardiac event.  Take medication as prescribed and eat a heart healthy, low sodium diet.    Smoking Risk   Goals: If you smoke, STOP!   Plan: Smoking DOUBLES your risk of having a cardiac event. Quitting can greatly reduce your risk. To register for smoking cessation program call [phone] or visit www.smokefree.gov    Diabetes Risk   Goal: Non-diabetic: Below 5.7%  Goal for diabetic: Less than 7%  Your Numbers: Hemoglobin A1C       Date                     Value               Ref Range           Status                03/31/2013               6.6 (H)             4.0 - 6.0 %         Final              Comment:    The ADA recommends that most patients with type 1 and type 2 diabetes maintain     an A1c level <7%.  ----------  Plan: If you have diabetes, even if treated, you are at an increased risk of having a cardiac event.     Alcohol Use Risk  Goal: Alcohol use can lead to a cardiac event.  Plan: For men, limit intake to no more than 2 drinks per day.  For women, limit intake to no more than one drink per day.    Weight Management Risk   Goal: Healthy: BMI is 18.5  to 24.9  Overweight: BMI is 25 to 29.9  Obese: BMI is 30 or higher  Morbid Obesity: BMI [...]  Your Numbers: Your BMI (Calculated): 25.77  Plan: If you have questions about your diet after you go home, you can call a dietitian at [phone] Physical Activity Risk   Goal: Patients should have approval by a physician prior to beginning an exercise program.  Plan: Try to get at least 30 minutes of moderate physical activity five days a week or 20 minutes of vigorous physical activity three days a week with your doctor's approval.    To the Neurology and the NeuroSurg Discharge order sets set add a new order in the education section titled Risk Reduction Plan, in the comments section add the following (default select this new order for neuro and neuro surg and ensure this information is added to the AVS).     Questions About Your Stay    For questions or concerns regarding your hospital stay:    - DURING BUSINESS HOURS (8:00 AM - 4:30 PM):    Call [phone] and asked to be transferred to your discharge attending physician.    - AFTER BUSINESS HOURS (4:30 PM - 8:00 AM, on weekends, or holidays):  Call [phone] and ask the operator to page the on-call doctor for the discharge attending physician.     Discharging attending physician: Kerney Elbe [[id number]]      Procedure Specific Activity    *Your left arm is your affected arm.  *Do not raise your elbow higher than shoulder level on affected side.  *Continue to move your shoulder so the joint does not become stiff.  *You may resume driving 5 days after surgery.  *If you toss and turn while sleeping, you should use your sling.  *No pushing, pulling, or lifting greater than 10-15 pounds with affected arm for 1 month.  *Avoid placing all your weight on your arms for 4 weeks.  *Avoid bowling, swimming, golfing, or swinging a baseball bat for 90 days.  *Avoid any activity or exercise that involves rough contact with device site.  *Avoid any activity or exercise that might cause injury to skin over the device.     Heart Failure Information    You are at risk for readmission to the hospital because of fluid overload. There are steps you can take to lower your risk: *Continue your current heart medications. Changes made have been made during your hospital stay.  *Remember to weigh yourself every morning, first thing after you urinate, and write down your weigh. Take this record to your doctor's appointments.  *Chart your symptoms - fatigue, shortness of breath, swelling, etc. Immediately report any worsening.  *Remember to consume no more than 2,000mg  (milligrams) of sodium daily. Watch out for packaged, processed, canned, and restaurant foods especially.  *If any of your symptoms worsen, or if you gain more than 2 pounds in 24 hours, or 5 pounds in a week, call your cardiologist immediately. EARLY REPORTING OF THESE CHANGES IS VERY IMPORTANT.   Condition Code   Ordering Mode Outpatient     INSTRUCTIONS, ADDITIONAL    Please wear compression stockings at home during the day.     Return Appointment    You have an appointment at the above time at our Cardiology Clinic at the [facility] location to remove your dressing and evaluate incision.    [facility] information:  [address] [place], [place]  [address]  [phone]  Appointment date: 09/16/2018    Appointment time: 10:00 AM      DEVICE EVALUATION - ICD   Standing Status: Future Standing Exp. Date: 09/09/19     Scheduling Priority: Routine      REQUEST FOR CARDIOLOGY APPOINTMENT   Standing Status: Future Standing Exp. Date: 09/09/23     Scheduling Priority: Routine    NP/Nurse Provider/Diet EP Team        Additional Orders: Case Management, Supplies, Home Health     Home Health/DME              HOME HEALTH/DME  ONCE     Comments:  Home Health/Durable Medical Equipment Order Details    Patient Name:  Logan Bright                   Medical Record Number:   [id number]      Agency Instructions:     Start of care within 24-48 hours of hospital discharge, anticipated discharge 09/10/2018. RN to complete general assessment, including vitals with temperature, monitor and teach patient and/or caregiver medication management and compliance, monitor and teach pain management and pain medication compliance when necessary. Monitor and teach signs and symptoms of infection, monitor and teach disease management, including signs and symptoms, diet, who and when to call, hospital readmission avoidance.      Social work to assess home situation, provide assistance for patient and caregiver with coping and disease management and to  provide community resources.    Speech therapy to evaluate and treat patient.     Physical Therapy  to evaluate and treat along with home safety evaluation.  Teach strategies for energy conservation, mobility training, strengthening, balance activities and address deficits, maximize function and improve safety.      Occupational Therapy   to evaluate and treat along with home safety evaluation.  Teach strategies for energy conservation, mobility training, strengthening, balance activities and address deficits, maximize function and improve safety.    Clinical findings to support homebound status: Poor tolerance for activity    Clinical findings to support home care services: Muscle weakness affecting functional activities    I certify that this patient is under my care and that I, or a nurse practitioner or physician's assistant working with me, had a face-to-face encounter that meets the physician's face-to-face encounter requirements with this patient on 09/10/2018.    This patient is under my care, and I have initiated the establishment of the plan of care.??? This patient will be followed by a physician after discharge, who will periodically review the plan of care.   Question Answer Comment   Attending Name/Contact Dr. Casimiro Needle [facility] [phone]  NPI: [id number]    PCP Name/Contact Danella Maiers [phone]    Home Health to Follow PCP            HOME HEALTH/DME  ONCE,   Status:  Canceled     Comments:  Home Health/Durable Medical Equipment Order Details Patient Name:  Logan Bright                   Medical Record Number:   [id number]      Agency Instructions:     Start of care within 24-48 hours of hospital discharge, anticipated discharge 09/07/2018. RN to complete general assessment, including vitals with temperature, monitor and teach patient and/or caregiver medication management and compliance, monitor and teach pain management and pain medication compliance when necessary. Monitor and teach signs and symptoms  of infection, monitor and teach disease management, including signs and symptoms, diet, who and when to call, hospital readmission avoidance.      Social work to assess home situation, provide assistance for patient and caregiver with coping and disease management and to  provide community resources.     Physical Therapy  to evaluate and treat along with home safety evaluation.  Teach strategies for energy conservation, mobility training, strengthening, balance activities and address deficits, maximize function and improve safety.      Occupational Therapy   to evaluate and treat along with home safety evaluation.  Teach strategies for energy conservation, mobility training, strengthening, balance activities and address deficits, maximize function and improve safety.    Clinical findings to support homebound status: Poor tolerance for activity    Clinical findings to support home care services: Muscle weakness affecting functional activities    I certify that this patient is under my care and that I, or a nurse practitioner or physician's assistant working with me, had a face-to-face encounter that meets the physician's face-to-face encounter requirements with this patient on 09/07/2018.    This patient is under my care, and I have initiated the establishment of the plan of care.??? This patient will be followed by a physician after discharge, who will periodically review the plan of care.   Question Answer Comment Attending Name/Contact Dr. Casimiro Needle [facility] [phone]  NPI: [id number]    PCP Name/Contact Danella Maiers [phone]    Home Health to Follow PCP                         Signed:  Kerney Elbe, MD  09/12/2018      cc:  Primary Care Physician:  Vanice Sarah   Verified  Referring physicians:  No ref. provider found   Additional provider(s):        Did we miss something? If additional records are needed, please fax a request on office letterhead to [phone]. Please include the patient's name, date of birth, fax number and type of information needed. Additional request can be made by email at [email or web address]. For general questions of information about electronic records sharing, call [phone].

## 2018-09-14 ENCOUNTER — Encounter
Admit: 2018-09-14 | Discharge: 2018-09-14 | Payer: MEDICARE | Primary: Student in an Organized Health Care Education/Training Program

## 2018-09-14 MED ORDER — FUROSEMIDE 40 MG PO TAB
40 mg | ORAL_TABLET | Freq: Every day | ORAL | 3 refills | 90.00000 days | Status: DC
Start: 2018-09-14 — End: 2019-07-04

## 2018-09-16 ENCOUNTER — Encounter: Admit: 2018-09-16 | Discharge: 2018-09-16 | Primary: Cardiovascular Disease

## 2018-09-16 ENCOUNTER — Ambulatory Visit: Admit: 2018-09-16 | Discharge: 2018-09-17 | Primary: Cardiovascular Disease

## 2018-09-16 NOTE — Progress Notes
Removed Silverlon dressing. Left prepectoral incision is clean, dry, well approximated, and healing without evidence of drainage or discharge. Steri-Strips dry and intact. Pt reports no adverse symptoms. Incision care, including signs and symptoms of infection, reviewed(as below). Pt verbalized understanding and will remain in phone contact.    You may shower once dressing removed at follow up appointment; however avoid direct contact with the incision (allow the water to hit the back of your shoulder rather than directly on the incision).    Do not submerge incision in tub, pool, hot tub, or lake for 4 weeks.    Unless your incision is bleeding or draining, keep it open to air.    Avoid applying deodorants, powders, creams, lotions, etc. to your incision for 4 weeks.    Usually there are no stitches to be removed. Steri-strips will begin to fall off in 10-14 days. If they remain after 2 weeks, gently remove them when they are damp after a shower.    Your incision should gradually look better each day. Please notify our office immediately if you notice any of the following:   -an increase in swelling or redness   -any drainage   -increasing pain at the incision site  -fever over 100 degrees

## 2018-09-18 ENCOUNTER — Encounter: Admit: 2018-09-18 | Discharge: 2018-09-18 | Primary: Cardiovascular Disease

## 2018-09-18 DIAGNOSIS — I255 Ischemic cardiomyopathy: Secondary | ICD-10-CM

## 2018-09-18 DIAGNOSIS — I5043 Acute on chronic combined systolic (congestive) and diastolic (congestive) heart failure: Secondary | ICD-10-CM

## 2018-09-18 DIAGNOSIS — I480 Paroxysmal atrial fibrillation: Secondary | ICD-10-CM

## 2018-09-18 DIAGNOSIS — I10 Essential (primary) hypertension: Secondary | ICD-10-CM

## 2018-09-18 NOTE — Telephone Encounter
Received a second call from [employer], RN with [facility].  She states that the family of Gannon contacted her and wanted the patient to be evaluated for hospice care an, if appropriate, admit patient to hospice care.  They are requesting a verbal order for patient to be evaluated for hospice care, and admitted if appropriate.  Katie's callback number is [phone].

## 2018-09-18 NOTE — Telephone Encounter
Discussed sx with [facility] since [facility] out of town.  Plan to increase lasix to 80 mg x 3 days, double potassium x 3 days and check blood work on Monday (bmp, mag, bnp).  LMOM for [facility] nurse, Ronalee Belts with recommendations.  Asked for a return call with questions or concerns.

## 2018-09-18 NOTE — Telephone Encounter
Received call from Ronalee Belts [facility] stating that patient is having some symptoms.  He is having a little difficulty breathing, cough, and has crackles in bilateral lung bases.  He states that his weight is stable, but states that he hasn't had any appetite and hasn't been eating very well.  He is also having some swelling in his lower extremities.  Mike's callback number is [phone].    Will route to [facility]. Joe RNs to discuss with Dr. Virgina Organ as Dr. Ricard Dillon is out of the office today.

## 2018-09-19 ENCOUNTER — Encounter: Admit: 2018-09-19 | Discharge: 2018-09-19 | Primary: Cardiovascular Disease

## 2018-09-19 NOTE — Telephone Encounter
Received call from [place], [place] [phone]).  She needs to know the physician following the pt for home health care orders.  Per discharge summary on 8/13, Dr. Ricard Dillon was listed as the PCP and PCP noted to be following pt for home health.  I told her she could put him down for now, until further clarification could be made.  Dr. Ricard Dillon is not the on-call cardiologist for today.  Remus Blake mentioned that the family is interested in pursuing hospice care for this pt.  I told her I would ask the nursing team for Dr. Ricard Dillon to follow-up regarding these questions on Monday.  [place] call back number is [phone].

## 2018-09-24 ENCOUNTER — Encounter: Admit: 2018-09-24 | Discharge: 2018-09-24 | Primary: Cardiovascular Disease

## 2018-09-24 ENCOUNTER — Ambulatory Visit: Admit: 2018-09-24 | Discharge: 2018-09-24 | Primary: Cardiovascular Disease

## 2018-09-24 DIAGNOSIS — Z8679 Personal history of other diseases of the circulatory system: Secondary | ICD-10-CM

## 2018-09-24 DIAGNOSIS — I2510 Atherosclerotic heart disease of native coronary artery without angina pectoris: Secondary | ICD-10-CM

## 2018-09-24 DIAGNOSIS — I5042 Chronic combined systolic (congestive) and diastolic (congestive) heart failure: Secondary | ICD-10-CM

## 2018-09-24 DIAGNOSIS — I10 Essential (primary) hypertension: Secondary | ICD-10-CM

## 2018-09-24 DIAGNOSIS — Z955 Presence of coronary angioplasty implant and graft: Secondary | ICD-10-CM

## 2018-09-24 DIAGNOSIS — I255 Ischemic cardiomyopathy: Secondary | ICD-10-CM

## 2018-09-24 DIAGNOSIS — T148XXA Other injury of unspecified body region, initial encounter: Secondary | ICD-10-CM

## 2018-09-24 DIAGNOSIS — E785 Hyperlipidemia, unspecified: Secondary | ICD-10-CM

## 2018-09-24 DIAGNOSIS — M1990 Unspecified osteoarthritis, unspecified site: Secondary | ICD-10-CM

## 2018-09-24 DIAGNOSIS — N289 Disorder of kidney and ureter, unspecified: Secondary | ICD-10-CM

## 2018-09-24 DIAGNOSIS — Z95810 Presence of automatic (implantable) cardiac defibrillator: Secondary | ICD-10-CM

## 2018-09-24 DIAGNOSIS — R55 Syncope and collapse: Secondary | ICD-10-CM

## 2018-09-24 NOTE — Patient Instructions
1.  Stop losartan  2.  We will contact the [place] regarding Hospice status for Medicare

## 2018-09-24 NOTE — Progress Notes
Date of Service: 09/24/2018    Logan Bright is a 83 y.o. male.       HPI     Logan Bright was in the [facility] with his friend, neighbor, and DPOA, Logan Bright.  [name] owns a Leisure centre manager from [place] apartment and she takes him meals three times/day.  She really is a god-send for him and really cares for him beautifully!    Logan Bright was hospitalized a couple of weeks ago for HF exacerbation.  He gained some fluid immediately post-discharge, but an increase in diuretic dosing over the weekend diuresed 10 pounds and he's doing better.  His appetite wasn't real good when he first got home, but seems to have improved post-diuresis.  He, as usual, says that he feels fine.  [name] and I talked about Hospice status for Logan Bright and, to the degree he can understand, is agreeable.    We talked about daily weights and [name] will help with this.    [name]'s BP was a lot lower than necessary today and I am going to discontinue the losartan today--hopefully this will make him feel a little better.         Vitals:    09/24/18 0956 09/24/18 1001   BP: 90/58 92/58   BP Source: Arm, Left Upper Arm, Right Upper   Pulse: 68    Temp: 36.5 ???C (97.7 ???F)    SpO2: 97%    Weight: 80.7 kg (178 lb)    Height: 1.829 m (6')    PainSc: Zero      Body mass index is 24.14 kg/m???.     Past Medical History  Patient Active Problem List    Diagnosis Date Noted   ??? Acute on chronic systolic congestive heart failure (HCC) 09/04/2018   ??? Acute on chronic systolic heart failure (HCC) 09/04/2018   ??? Hyponatremia 09/04/2018   ??? Hypothyroidism 09/04/2018   ??? Paroxysmal atrial fibrillation (HCC) 04/07/2018   ??? 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 No hemodynamically significant common or internal carotid artery stenosis.     ??? Closed trimalleolar fracture of right ankle 11/12/2015 ??? Debility 06/10/2013   ??? Recurrent ventricular tachycardia w/ 2 VT ablations 04/2013 05/11/2013     Sustained VT w/ multiple ICD tachypacing and Shocks, failed amiodaron   05/05/13 & 05/12/13  VT ablations [facility] w/ impella support 4/15, Home off Coreg and losartan d/t post procedure hematoma, hypotension     ??? 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 Ermelinda Das.        ??? Ischemic cardiomyopathy      6/02 - infarct 2 stents .  [facility]            4/04 - Surveillance stress test at [facility]: reportedly negative.  4/05 - Stress thallium study: EF 30%, LAD and RCA defects, elevated lung uptake.- Echo doppler: EF 25%, amt& inf hypokinesis,  apical akinesis, poss  thrombus, mild valvular Regurg       5/06 - Stress thallium: EF 22%, no signficant interval change.  08/2011 - Dobut echo.  EF 10-15%. Antero-apical akinesis/dyskinesis.  No ischemia.  04/2013 RegadenThall EF 23% Extensive Anterior infarct, no ischemia,  unchanged from prior     ??? Chronic combined systolic (congestive) and diastolic (congestive) heart failure (HCC)       2003 - Status post ICD implant.    4/05 - EF 25-30% per stress thallium and echo doppler.  08/2011 - Echo EF 10-15%     ??? Neurologic cardiac syncope        3/05 - Developed syncope while at restaurant, taken to ED.  Symptoms of diaphoresis                    and lightheadedness.          4/05 - Head up tilt study: positive for inducing hypotension 72/49. Produced symptoms                    c/w previous syncopal episode.     ??? HTN (hypertension)                 2002 - Diagnosis established.     ??? Dyslipidemia         09/26/2008  03/14/2009  02/26/2011  04/17/2012  03/31/2013   LDL 107 (H) 141 (H) 80 108 (H) 104 (H)   04/2013: Refuses Statins understands that they reduce recurrent CV events, still declines.      ??? Gout ??? CKD (chronic kidney disease)      mild           Review of Systems   Constitution: Negative.   HENT: Negative.    Eyes: Negative.    Cardiovascular: Negative.    Respiratory: Negative.    Endocrine: Negative.    Hematologic/Lymphatic: Negative.    Skin: Negative.    Musculoskeletal: Negative.    Gastrointestinal: Negative.    Genitourinary: Negative.    Neurological: Negative.    Psychiatric/Behavioral: Negative.    Allergic/Immunologic: Negative.        Physical Exam    Physical Exam   General Appearance: no distress   Skin: warm, no ulcers or xanthomas   Digits and Nails: no cyanosis or clubbing   Eyes: conjunctivae and lids normal, pupils are equal and round   Teeth/Gums/Palate: dentition unremarkable, no lesions   Lips & Oral Mucosa: no pallor or cyanosis   Neck Veins: normal JVP , neck veins are not distended   Thyroid: no nodules, masses, tenderness or enlargement   Chest Inspection: chest is normal in appearance   Respiratory Effort: breathing comfortably, no respiratory distress   Auscultation/Percussion: lungs clear to auscultation, no rales or rhonchi, no wheezing   PMI: PMI not enlarged or displaced   Cardiac Rhythm: regular rhythm and normal rate   Cardiac Auscultation: S1, S2 normal, no rub, no gallop   Murmurs: no murmur   Peripheral Circulation: normal peripheral circulation   Carotid Arteries: normal carotid upstroke bilaterally, no bruits   Radial Arteries: normal symmetric radial pulses   Abdominal Aorta: no abdominal aortic bruit   Pedal Pulses: normal symmetric pedal pulses   Lower Extremity Edema: no lower extremity edema   Abdominal Exam: soft, non-tender, no masses, bowel sounds normal   Liver & Spleen: no organomegaly   Gait & Station: walks without assistance   Muscle Strength: normal muscle tone   Orientation: oriented to time, place and person   Affect & Mood: appropriate and sustained affect   Language and Memory: patient responsive and seems to comprehend information Neurologic Exam: neurological assessment grossly intact   Other: moves all extremities  Problems Addressed Today  Encounter Diagnoses   Name Primary?   ??? Chronic combined systolic (congestive) and diastolic (congestive) heart failure (HCC)    ??? Essential hypertension        Assessment and Plan       Chronic combined systolic (congestive) and diastolic (congestive) heart failure (HCC)  He has class 3 heart failure currently.  I'm stopping the losartan because his BP is too low.  Logan Jan will help with daily weights and I asked her to call us should his weight increase by greater than 3 pounds.  He still responds well to increases in oral diuretic dosing.    HTN (hypertension)  BP too low--stopping losartan today--he's only on 25 mg/day.      Current Medications (including today's revisions)  ??? allopurinol (ZYLOPRIM) 100 mg tablet TAKE 1 TABLET BY MOUTH DAILY AT BEDTIME   ??? amiodarone (CORDARONE) 200 mg tablet Take one tablet by mouth daily. Take with food.   ??? apixaban (ELIQUIS) 2.5 mg tablet Take one tablet by mouth twice daily.   ??? aspirin EC 81 mg tablet Take 81 mg by mouth daily. Take with food.   ??? carvediloL (COREG) 6.25 mg tablet Take one tablet by mouth twice daily with meals. Take with food.   ??? furosemide (LASIX) 40 mg tablet Take one tablet by mouth daily.   ??? levothyroxine (SYNTHROID) 50 mcg tablet Take one tablet by mouth daily 30 minutes before breakfast.   ??? potassium chloride (K-DUR) 10 mEq tablet TAKE 1 TABLET BY MOUTH TWICE DAILY WITH A MEAL AND A FULL GLASS OF WATER

## 2018-09-24 NOTE — Assessment & Plan Note
BP too low--stopping losartan today--he's only on 25 mg/day.

## 2018-09-24 NOTE — Assessment & Plan Note
He has class 3 heart failure currently.  I'm stopping the losartan because his BP is too low.  Coralyn Helling will help with daily weights and I asked her to call us should his weight increase by greater than 3 pounds.  He still responds well to increases in oral diuretic dosing.

## 2018-09-27 ENCOUNTER — Encounter: Admit: 2018-09-27 | Discharge: 2018-09-27 | Primary: Cardiovascular Disease

## 2018-09-27 ENCOUNTER — Emergency Department: Admit: 2018-09-27 | Discharge: 2018-09-27 | Attending: Student in an Organized Health Care Education/Training Program

## 2018-09-27 DIAGNOSIS — E785 Hyperlipidemia, unspecified: Secondary | ICD-10-CM

## 2018-09-27 DIAGNOSIS — Z95810 Presence of automatic (implantable) cardiac defibrillator: Secondary | ICD-10-CM

## 2018-09-27 DIAGNOSIS — N289 Disorder of kidney and ureter, unspecified: Secondary | ICD-10-CM

## 2018-09-27 DIAGNOSIS — I5023 Acute on chronic systolic (congestive) heart failure: Secondary | ICD-10-CM

## 2018-09-27 DIAGNOSIS — I5043 Acute on chronic combined systolic (congestive) and diastolic (congestive) heart failure: Secondary | ICD-10-CM

## 2018-09-27 DIAGNOSIS — N183 Chronic kidney disease, stage 3 (moderate): Secondary | ICD-10-CM

## 2018-09-27 DIAGNOSIS — R42 Dizziness and giddiness: Secondary | ICD-10-CM

## 2018-09-27 DIAGNOSIS — I10 Essential (primary) hypertension: Secondary | ICD-10-CM

## 2018-09-27 DIAGNOSIS — T148XXA Other injury of unspecified body region, initial encounter: Secondary | ICD-10-CM

## 2018-09-27 DIAGNOSIS — R55 Syncope and collapse: Secondary | ICD-10-CM

## 2018-09-27 DIAGNOSIS — Z955 Presence of coronary angioplasty implant and graft: Secondary | ICD-10-CM

## 2018-09-27 DIAGNOSIS — I2510 Atherosclerotic heart disease of native coronary artery without angina pectoris: Secondary | ICD-10-CM

## 2018-09-27 DIAGNOSIS — Z8679 Personal history of other diseases of the circulatory system: Secondary | ICD-10-CM

## 2018-09-27 DIAGNOSIS — I255 Ischemic cardiomyopathy: Secondary | ICD-10-CM

## 2018-09-27 DIAGNOSIS — M1990 Unspecified osteoarthritis, unspecified site: Secondary | ICD-10-CM

## 2018-09-27 MED ORDER — ASPIRIN 81 MG PO CHEW
324 mg | Freq: Once | ORAL | 0 refills | Status: CP
Start: 2018-09-27 — End: ?
  Administered 2018-09-28: 04:00:00 324 mg via ORAL

## 2018-09-28 ENCOUNTER — Encounter: Admit: 2018-09-28 | Discharge: 2018-09-28 | Primary: Cardiovascular Disease

## 2018-09-28 DIAGNOSIS — R42 Dizziness and giddiness: Secondary | ICD-10-CM

## 2018-09-28 LAB — MAGNESIUM
Lab: 2 mg/dL (ref >60)
Lab: 1.8 mg/dL (ref 1.6–2.6)

## 2018-09-28 LAB — COMPREHENSIVE METABOLIC PANEL
Lab: 1.3 mg/dL — ABNORMAL HIGH (ref 0.3–1.2)
Lab: 1.4 mg/dL — ABNORMAL HIGH (ref 0.4–1.24)
Lab: 134 MMOL/L — ABNORMAL LOW (ref 137–147)
Lab: 151 U/L — ABNORMAL HIGH (ref 25–110)
Lab: 26 mg/dL — ABNORMAL HIGH (ref 7–25)
Lab: 29 MMOL/L (ref 21–30)
Lab: 3.5 g/dL (ref 3.5–5.0)
Lab: 4.3 MMOL/L (ref 3.5–5.1)
Lab: 42 U/L — ABNORMAL HIGH (ref 7–40)
Lab: 47 mL/min — ABNORMAL LOW (ref >60)
Lab: 53 U/L (ref 7–56)
Lab: 57 mL/min — ABNORMAL LOW (ref >60)
Lab: 6.8 g/dL (ref 6.0–8.0)
Lab: 8.4 mg/dL — ABNORMAL LOW (ref 8.5–10.6)
Lab: 95 mg/dL (ref 70–100)
Lab: 96 MMOL/L — ABNORMAL LOW (ref 98–110)
Lab: 9 (ref 3–12)

## 2018-09-28 LAB — CBC AND DIFF
Lab: 0 10*3/uL (ref 0–0.20)
Lab: 0 10*3/uL (ref 0–0.45)
Lab: 0.3 10*3/uL (ref 0–0.80)
Lab: 1 % (ref 0–2)
Lab: 1 % (ref 0–5)
Lab: 10 % — ABNORMAL LOW (ref 24–44)
Lab: 16 % — ABNORMAL HIGH (ref 11–15)
Lab: 174 10*3/uL (ref 150–400)
Lab: 25 — ABNORMAL HIGH (ref <20.7)
Lab: 3.9 10*3/uL (ref 1.8–7.0)
Lab: 31 pg (ref 26–34)
Lab: 33 g/dL (ref 32.0–36.0)
Lab: 5 10*3/uL (ref 4.5–11.0)
Lab: 5.8 M/UL — ABNORMAL HIGH (ref 4.4–5.5)
Lab: 54 % — ABNORMAL HIGH (ref 40–50)
Lab: 8 % (ref 4–12)
Lab: 80 % — ABNORMAL HIGH (ref 41–77)
Lab: 93 FL (ref 80–100)

## 2018-09-28 LAB — URINALYSIS MICROSCOPIC REFLEX TO CULTURE

## 2018-09-28 LAB — URINALYSIS DIPSTICK REFLEX TO CULTURE
Lab: NEGATIVE
Lab: NEGATIVE
Lab: NEGATIVE
Lab: NEGATIVE
Lab: NEGATIVE
Lab: NEGATIVE

## 2018-09-28 LAB — PROCALCITONIN: Lab: 0.1 ng/mL (ref <0.11)

## 2018-09-28 LAB — TSH WITH FREE T4 REFLEX: Lab: 3 uU/mL — ABNORMAL LOW (ref >60)

## 2018-09-28 LAB — BASIC METABOLIC PANEL
Lab: 1.4 mg/dL — ABNORMAL HIGH (ref 0.4–1.24)
Lab: 119 mg/dL — ABNORMAL HIGH (ref 70–100)
Lab: 134 MMOL/L — ABNORMAL LOW (ref 137–147)
Lab: 26 mg/dL — ABNORMAL HIGH (ref 7–25)
Lab: 27 MMOL/L (ref 21–30)
Lab: 57 mL/min — ABNORMAL LOW (ref >60)
Lab: 98 MMOL/L (ref 98–110)
Lab: 9 (ref 3–12)

## 2018-09-28 LAB — PHOSPHORUS: Lab: 2.5 mg/dL (ref 2.0–4.5)

## 2018-09-28 LAB — D-DIMER: Lab: 208 ng{FEU}/mL — ABNORMAL HIGH (ref <500)

## 2018-09-28 LAB — POC TROPONIN
Lab: 0.2 ng/mL — ABNORMAL HIGH (ref 0.00–0.05)
Lab: 0 ng/mL (ref 0.00–0.05)

## 2018-09-28 LAB — COVID-19 (SARS-COV-2) PCR: Lab: DETECTED — AB

## 2018-09-28 LAB — BNP POC ER: Lab: 593 pg/mL — ABNORMAL HIGH (ref 0–100)

## 2018-09-28 MED ORDER — ALLOPURINOL 100 MG PO TAB
100 mg | Freq: Every evening | ORAL | 0 refills | Status: DC
Start: 2018-09-28 — End: 2018-10-01
  Administered 2018-09-29 – 2018-10-01 (×3): 100 mg via ORAL

## 2018-09-28 MED ORDER — SODIUM CHLORIDE 0.9 % IJ SOLN
50 mL | Freq: Once | INTRAVENOUS | 0 refills | Status: CP
Start: 2018-09-28 — End: ?
  Administered 2018-09-28: 06:00:00 50 mL via INTRAVENOUS

## 2018-09-28 MED ORDER — AMIODARONE 200 MG PO TAB
200 mg | Freq: Every day | ORAL | 0 refills | Status: DC
Start: 2018-09-28 — End: 2018-09-28
  Administered 2018-09-28: 14:00:00 200 mg via ORAL

## 2018-09-28 MED ORDER — LEVOTHYROXINE 50 MCG PO TAB
50 ug | Freq: Every day | ORAL | 0 refills | Status: DC
Start: 2018-09-28 — End: 2018-10-01
  Administered 2018-09-28 – 2018-10-01 (×4): 50 ug via ORAL

## 2018-09-28 MED ORDER — FUROSEMIDE 10 MG/ML IJ SOLN
40 mg | Freq: Once | INTRAVENOUS | 0 refills | Status: CP
Start: 2018-09-28 — End: ?
  Administered 2018-09-28: 14:00:00 40 mg via INTRAVENOUS

## 2018-09-28 MED ORDER — AMIODARONE 200 MG PO TAB
200 mg | Freq: Two times a day (BID) | ORAL | 0 refills | Status: DC
Start: 2018-09-28 — End: 2018-09-30
  Administered 2018-09-29 – 2018-09-30 (×4): 200 mg via ORAL

## 2018-09-28 MED ORDER — FUROSEMIDE 10 MG/ML IJ SOLN
40 mg | Freq: Two times a day (BID) | INTRAVENOUS | 0 refills | Status: DC
Start: 2018-09-28 — End: 2018-09-29
  Administered 2018-09-28 – 2018-09-29 (×2): 40 mg via INTRAVENOUS

## 2018-09-28 MED ORDER — APIXABAN 2.5 MG PO TAB
2.5 mg | Freq: Two times a day (BID) | ORAL | 0 refills | Status: DC
Start: 2018-09-28 — End: 2018-10-01
  Administered 2018-09-28 – 2018-10-01 (×7): 2.5 mg via ORAL

## 2018-09-28 MED ORDER — CARVEDILOL 6.25 MG PO TAB
6.25 mg | Freq: Two times a day (BID) | ORAL | 0 refills | Status: DC
Start: 2018-09-28 — End: 2018-09-28
  Administered 2018-09-28: 14:00:00 6.25 mg via ORAL

## 2018-09-28 MED ORDER — IOHEXOL 350 MG IODINE/ML IV SOLN
75 mL | Freq: Once | INTRAVENOUS | 0 refills | Status: CP
Start: 2018-09-28 — End: ?
  Administered 2018-09-28: 06:00:00 75 mL via INTRAVENOUS

## 2018-09-28 MED ORDER — ASPIRIN 81 MG PO TBEC
81 mg | Freq: Every day | ORAL | 0 refills | Status: DC
Start: 2018-09-28 — End: 2018-10-01
  Administered 2018-09-28 – 2018-10-01 (×4): 81 mg via ORAL

## 2018-09-28 MED ORDER — POTASSIUM CHLORIDE 10 MEQ PO TBTQ
10 meq | Freq: Two times a day (BID) | ORAL | 0 refills | Status: DC
Start: 2018-09-28 — End: 2018-10-01
  Administered 2018-09-28 – 2018-10-01 (×7): 10 meq via ORAL

## 2018-09-28 MED ORDER — METOPROLOL SUCCINATE 25 MG PO TB24
25 mg | Freq: Every day | ORAL | 0 refills | Status: DC
Start: 2018-09-28 — End: 2018-10-01
  Administered 2018-09-29 – 2018-10-01 (×3): 25 mg via ORAL

## 2018-09-28 MED ORDER — FUROSEMIDE 10 MG/ML IJ SOLN
40 mg | Freq: Once | INTRAVENOUS | 0 refills | Status: CP
Start: 2018-09-28 — End: ?
  Administered 2018-09-28: 07:00:00 40 mg via INTRAVENOUS

## 2018-09-28 NOTE — ED Notes
[employer] called with report- no arrhythmias, firings or abnormal electrical activity detected since pacemaker was placed.

## 2018-09-28 NOTE — Progress Notes
Assumed pt care at 0450. All assessments documented per flowsheets.    Pt A&Ox4. Tolerating RA. AV Paced on tele.   UOP adequate. No BM. Positive bowel sounds.  No c/o of pain, N/V, or SOB.  Up x3 and a walker due to dizziness and poor stability.  Resting comfortably after PM snack ([place] sandwich and pudding).     High fall risk bundle in place. Call light within reach. Will continue to monitor.

## 2018-09-28 NOTE — Progress Notes
Patient arrived to room # HC410 via bed accompanied by transport. Patient transferred to the bed with assistance. Bedside safety checks completed. Initial patient assessment completed. Refer to flowsheet for details.    Admission skin assessment completed with: Ander Purpura, RN    Pressure injury present on arrival?: No    1. Head/Face/Neck: NO  2. Trunk/Back: No  3. Upper Extremities: No  4. Lower Extremities: No  5. Pelvic/Coccyx: No  6. Assessed for device associated injury? Yes  7. Malnutrition Screening Tool (Nursing Nutrition Assessment) Completed? Yes    See Doc Flowsheet for additional wound details.     INTERVENTIONS:

## 2018-09-28 NOTE — Care Plan
Problem: Discharge Planning  Goal: Participation in plan of care  09/28/2018 1813 by Hanley Seamen, RN  Outcome: Goal Ongoing  Flowsheets (Taken 09/28/2018 1813)  Participation in Plan of Care: Involve patient/caregiver in care planning decision making  09/28/2018 1812 by Hanley Seamen, RN  Outcome: Goal Ongoing  Flowsheets (Taken 09/28/2018 1812)  Participation in Plan of Care: Involve patient/caregiver in care planning decision making  Goal: Knowledge regarding plan of care  09/28/2018 1813 by Hanley Seamen, RN  Outcome: Goal Ongoing  Flowsheets (Taken 09/28/2018 1813)  Knowledge regarding plan of care:   Provide infection prevention education   Provide VTE signs and symptoms education   Provide plan of care education   Provide fall prevention education   Provide medication management education  09/28/2018 1812 by Hanley Seamen, RN  Outcome: Goal Ongoing  Flowsheets (Taken 09/28/2018 1812)  Knowledge regarding plan of care:   Provide infection prevention education   Provide VTE signs and symptoms education   Provide plan of care education   Provide fall prevention education   Provide medication management education  Goal: Prepared for discharge  09/28/2018 1813 by Hanley Seamen, RN  Outcome: Goal Ongoing  Flowsheets (Taken 09/28/2018 1813)  Prepared for discharge:   Complete ADL ability assessment   Provide safe use medical equipment education   Collaborate with multidisciplinary team for hospital discharge coordination   Provide diet and oral health education  09/28/2018 1812 by Hanley Seamen, RN  Outcome: Goal Ongoing  Flowsheets (Taken 09/28/2018 1812)  Prepared for discharge:   Complete ADL ability assessment   Collaborate with multidisciplinary team for hospital discharge coordination   Provide safe use medical equipment education   Provide diet and oral health education     Problem: Falls, High Risk of  Goal: Absence of falls-Adult Patient  09/28/2018 1813 by Hanley Seamen, RN  Outcome: Goal Ongoing Flowsheets (Taken 09/28/2018 1813)  Absence of falls-Adult Patient:   Complete Fall Risk Assessment.   Implement fall risk bundle.   Provide safe ambulation.   Consider additional interventions if patient is confused, has gait/balance problems and on high risk medications.   Provde safe environment.   Provide fall prevention strategies.  09/28/2018 1812 by Hanley Seamen, RN  Outcome: Goal Ongoing  Flowsheets (Taken 09/28/2018 1812)  Absence of falls-Adult Patient:   Complete Fall Risk Assessment.   Implement fall risk bundle.   Provide safe ambulation.   Consider additional interventions if patient is confused, has gait/balance problems and on high risk medications.   Provde safe environment.   Provide fall prevention strategies.  Goal: Absence of Falls-Pediatric patient  09/28/2018 1813 by Hanley Seamen, RN  Outcome: Goal Ongoing  09/28/2018 1812 by Hanley Seamen, RN  Outcome: Goal Ongoing     Problem: Infection, Risk of  Goal: Absence of infection  09/28/2018 1813 by Hanley Seamen, RN  Outcome: Goal Ongoing  Flowsheets (Taken 09/28/2018 1813)  Absence of infection:   Assess for infection (Monitor SIRS Criteria)   Administer pharmacological therapies as ordered   Monitor for signs and symptoms of infection   Implement prevention measures as indicated  09/28/2018 1812 by Hanley Seamen, RN  Outcome: Goal Ongoing  Goal: Knowledge of Infection Control Procedures  09/28/2018 1813 by Hanley Seamen, RN  Outcome: Goal Ongoing  Flowsheets (Taken 09/28/2018 1813)  Knowledge of Infection Control procedures: Provide Isolation Precautions Education  09/28/2018 1812 by Hanley Seamen, RN  Outcome: Goal Ongoing

## 2018-09-28 NOTE — Consults
Clinical Nutrition Initial Assessment    Name: Logan Bright        MRN: [id number]          DOB: 28-Feb-1935          Age: 83 y.o.  Admission Date: 09/27/2018             LOS: 0 days      Recommendation:  Continue Cardiac diet    Comments:  Logan Bright is an 83 year old Caucasian male with a past medical history of paroxysmal atrial fibrillation on anticoagulation, neurocardiogenic syncope, HTN, HLD, CKD, gout, chronic combined CHF status post ICD, history of VT status post VT ablation, CAD status post stent placement who presented to the ER today with near syncope. Covid positive.     8/31 RD consulted for HF Diet Education. RD spoke with pt over the phone due to covid isolation precautions. Pt reports weight loss which he attributes to fluid loss from lasix. Current wt 176 lbs, down from 195 lbs one month ago and down from 221 lbs 1 year ago. Reports UBW ~190 lbs, but unsure when he last weighed this. Pt reports good appetite and intake. Pt on Cardiac diet with 100% of 2 meals documented. Pt asked RD to order lunch for him- ordered meatloaf and iced tea. Pt denies monitoring his sodium intake, but states he doesn't add extra salt to foods and is aware of which foods contain sodium. + BM 8/31. No pressure injuries noted. Pt not at acute nutrtion risk. RD will remain available.     Nutrition Assessment of Patient:  Admit Weight: 80 kg;  ;    BMI (Calculated): 25.23; BMI Categories Adult: Acceptable: 18.5-24.9; Appearance: Unable to observe    Pertinent Allergies/Intolerances: none  Pertinent Labs: Reviewed; Pertinent Meds: Reviewed;    Oral Diet Order: Cardiac;       Current Oral Intake: Adequate    Malnutrition Assessment:   Does not meet criteria;       Nutrition Focused Physical Assessment:   Edema: Yes; Severity: Mild; Location: Lower extremities     Pressure Injury: none       Intervention / Plan:  Reviewed Low sodium diet education, encouraged pt to avoid adding salt to foods and avoid high sodium foods, reviewed high sodium food list.           Clinical Dietitian: Debera Lat, MS, RD, LD  Available on [facility]

## 2018-09-28 NOTE — Consults
Heart Failure Consult    NAME:Logan Bright             MRN: [id number]                 DOB:29-Oct-1935          AGE: 83 y.o.  ADMISSION DATE: 09/27/2018             DAYS ADMITTED: LOS: 0 days      Principal Problem:    Acute on chronic combined systolic (congestive) and diastolic (congestive) heart failure (HCC)      Recommendations:  Today:  1. Hold evening dose of coreg-done. Switch to Toprol XL 25 mg daily tomorrow-ordered.   2. Lasix 40 mg IV BID-ordered. Net I/O for entire hospital stay: -550 mL, net for last 24 hrs -550 mL. Scr was 1.43 today and 1.44 yesterday.  3. Continue PTA apixaban 2.5 mg BID for AC for PAF.  4. Dietician consultation to discuss sodium restricted diet recommended.     Ongoing:  1. BMP once a day/BID while on diuretic gtt. Magnesium level daily.  Keep Potassium greater than 4.0 and Magnesium greater than 2.0.  2. 2000mg  sodium dietary restriction.  3. Fluid Restriction:1.5  4. Strict I/O. Goal output: net neg 1-2L/24 hour  5. Daily standing scale weight. Goal Dry Weight: unknown     Primary team responsible for placing Post???Discharge Health System Appointment Request order set for Cardiology: Heart Failure appointment request within 24-48 hours of discharge. (Please do not place order any earlier in effort to reduce possible need for cancellation and rescheduling of visit).    Pt examined and discussed with Dr. Duane Lope.    Paticia Stack, APRN-NP  Department of Cardiovascular Medicine  [facility]  Pager [phone]  Available on Voalte  ___________________________________________________________________________________________________    Assessment:   Acute on chronic systolic and diastolic HFrEF,  EF: 20% per Echo 09/04/2018.  Major Complications or Comorbidities [facility]): acute/ acute on chronic systolic and/or diastolic heart failure  NYHA functional class III (marked limitation of physical activity - comfortable at rest, but less than ordinary activity causes symptoms of HF e.g., getting dressed or standing from a sitting position),   ACC Stage C (structural heart disease with prior or current symptoms of HF).   He presents with signs of hypervolemia without signs of low flow state.  -09/27/2018 CXR showed Mild cardiomegaly with development of pulmonary venous congestion and edema consistent with CHF/volume overload.   -09/28/2018 CTA chest revealed No PE, ???Mild cardiomegaly, bilateral pleural effusions, and mild interstitial pulmonary edema, with combination of findings most consistent with CHF/fluid overload. Reflux of contrast in the upper IVC and hepatic veins is also consistent with CHF/elevated right heart pressures.   Admission BNP: POC: 593  Admission Weight: 84.4 kg (186 lb)        Most recent weights (inpatient):   Vitals:    09/27/18 2007 09/28/18 0446 09/28/18 0449   Weight: 84.4 kg (186 lb) 79.9 kg (176 lb 3.2 oz) 80.2 kg (176 lb 12.9 oz)          Intake/Output Summary (Last 24 hours) at 09/28/2018 0949  Last data filed at 09/28/2018 0935  Gross per 24 hour   Intake ???   Output 1325 ml   Net -1325 ml       Diuretic Therapy    Prior to admission dose    Lasix 40 mg daily   Given on admission  Daily Dosing   8/31 Lasix 40 mg IV x2, then Lasix 40 mg IV BID      GDMT PTA Changes   BB Coreg 6.25 mg BID Continued   8/31 Hold evening dose of coreg   9/1switch to Toprol XL 25 mg daily   ACEI/ARB/ARNI  Losartan 25 mg daily discontinued on 8/27 due to hypotension     Aldosterone Antagonist  No     Hydralazine/Nitrate No     Ivabradine No    HRMT  Yes (CRT-D)     Anticoagulation for Afib/flutter  Yes, apixaban 2.5 mg BID Continued       Ischemic Cardiomyopathy-EF 20%  -09/09/2018 s/p Medtronic CRT-D  Successful Dual-chamber cardiac resynchronization therapy (CRT) defibrillator upgrade (Medtronic) with addition of LV coronary venous lead to pre-existing dual-chamber ICD    CAD  -Hx CAD PCI with stents in 2002 at [facility]    PAF  History of VT   s/p VT ablation in 04/2013 by Dr. Wallene Huh -Continue PTA  APixaban, device interrogation  -Continue PTA Amiodarone 200 mg daily     HTN  -stable SBP 107-160s    CKD   -Baseline Scr ~1.5-1.7  -admission Scr 1.44>1.43 today    COVID 19 positive on 09/28/18  -May be contributing to underlying exacerbation  -Primary will transfer to COVID service for tomorrow  _____________________________________________________________________________    Reason for Consultation:  Evaluation and recommendations re: Advanced combined CHF. Coming in with presyncope/weakness. CT chest showed pulmonary edema/bilateral effusions     History of Present Illness: Logan Bright is a 83 y.o. male who we are asked to see for evaluation of heart failure. Patient with history of chronic combined HFrEF s/p CRT-D, history of VT s/p VT ablation, CAD s/p stent placement, paroxysmal atrial fibrillation on anticoagulation, HTN, HLD, CKD,   neurocardiogenic syncope, gout. Presented to the ED with near syncope.    He currently complains of fatigue, dyspnea on exertion, orthopnea, lightheadedness and near syncope.     He currently denies shortness of breath, paroxysmal nocturnal dyspnea, palpitations, chest pain, abdominal fullness, peripheral edema and weight change.    Review of Systems:  A 14 point review of systems was ascertained and is otherwise negative and/or normal except as noted above.       Medical History:   Diagnosis Date   ??? Arthritis    ??? CAD (coronary artery disease)    ??? Dyslipidemia    ??? Fracture     RT ANKLE   ??? History of ventricular tachycardia    ??? HTN (hypertension)    ??? ICD (implantable cardiac defibrillator) in place    ??? Ischemic cardiomyopathy    ??? Neurologic cardiac syncope    ??? Renal insufficiency     mild   ??? S/P coronary artery stent placement      Surgical History:   Procedure Laterality Date   ??? EXTERNAL FIXATION APPLICATION ANKLE Right 11/13/2015    Performed by Azzie Glatter, MD at [facility] OR   ??? EXTERNAL FIXATION REMOVAL RIGHT ANKLE Right 12/08/2015 Performed by Azzie Glatter, MD at [facility] OR   ??? OPEN REDUCTION INTERNAL FIXATION RIGHT ANKLE Right 12/08/2015    Performed by Azzie Glatter, MD at [facility] OR   ??? INSERTION LEAD LEFT VENTRICLE AT TIME OF INSERTION OF PACEMAKER/ IMPLANTABLE DEFIBRILLATOR Left 09/09/2018    Performed by Annamarie Dawley, MD at [facility] EP LAB   ??? REMOVAL AND REPLACEMENT PERMANENT PACEMAKER GENERATOR  - MULTIPLE LEAD SYSTEM  Left 09/09/2018    Performed by Annamarie Dawley, MD at [facility] EP LAB   ??? CARDIAC DEFIBRILLATOR PLACEMENT     ??? HX HEART CATHETERIZATION     ??? HX ICD PLACEMENT     ??? HX PACEMAKER PLACEMENT       Family History   Problem Relation Age of Onset   ??? Cancer Mother    ??? Gout Mother    ??? Cancer Sister    ??? Diabetes Sister    ??? Heart Attack Sister    ??? Hypertension Sister    ??? Gout Sister    ??? Arthritis-osteo Sister    ??? Stroke Sister    ??? Thyroid Disease Sister    ??? Migraines Sister      Social History     Socioeconomic History   ??? Marital status: Widowed     Spouse name: Not on file   ??? Number of children: Not on file   ??? Years of education: Not on file   ??? Highest education level: Not on file   Occupational History   ??? Not on file   Tobacco Use   ??? Smoking status: Never Smoker   ??? Smokeless tobacco: Never Used   Substance and Sexual Activity   ??? Alcohol use: No   ??? Drug use: No   ??? Sexual activity: Not on file   Other Topics Concern   ??? Not on file   Social History Narrative   ??? Not on file        Objective:    Allergies:   Allergies   Allergen Reactions   ??? Chlorhexidine Gluconate RASH   ??? Colchicine NAUSEA AND VOMITING        Medications:  Scheduled Meds:allopurinoL (ZYLOPRIM) tablet 100 mg, 100 mg, Oral, QHS  amiodarone (CORDARONE) tablet 200 mg, 200 mg, Oral, QDAY  apixaban (ELIQUIS) tablet 2.5 mg, 2.5 mg, Oral, BID  aspirin EC tablet 81 mg, 81 mg, Oral, QDAY  carvediloL (COREG) tablet 6.25 mg, 6.25 mg, Oral, BID w/meals  levothyroxine (SYNTHROID) tablet 50 mcg, 50 mcg, Oral, QDAY 30 min before breakfast potassium chloride SR (K-DUR) tablet 10 mEq, 10 mEq, Oral, BID w/meals    Continuous Infusions:  PRN and Respiratory Meds:    Medications Prior to Admission   Medication Sig Dispense Refill Last Dose   ??? allopurinol (ZYLOPRIM) 100 mg tablet TAKE 1 TABLET BY MOUTH DAILY AT BEDTIME 90 tablet 3 09/26/2018   ??? amiodarone (CORDARONE) 200 mg tablet Take one tablet by mouth daily. Take with food. (Patient taking differently: Take 200 mg by mouth twice daily. Take with food.) 90 tablet 3 09/27/2018   ??? apixaban (ELIQUIS) 2.5 mg tablet Take one tablet by mouth twice daily. 60 tablet 1 09/27/2018   ??? aspirin EC 81 mg tablet Take 81 mg by mouth daily. Take with food.   09/27/2018   ??? carvediloL (COREG) 6.25 mg tablet Take one tablet by mouth twice daily with meals. Take with food. 180 tablet 1 09/27/2018   ??? furosemide (LASIX) 40 mg tablet Take one tablet by mouth daily. 90 tablet 3 09/27/2018   ??? levothyroxine (SYNTHROID) 50 mcg tablet Take one tablet by mouth daily 30 minutes before breakfast. 90 tablet 3 09/27/2018   ??? potassium chloride (K-DUR) 10 mEq tablet TAKE 1 TABLET BY MOUTH TWICE DAILY WITH A MEAL AND A FULL GLASS OF WATER 180 tablet 3 09/27/2018  Vital Signs:  Last Filed                Vital Signs: 24 Hour Range   BP: 107/59 (08/31 0800)  Temp: 36.2 ???C (97.2 ???F) (08/31 0800)  Pulse: 60 (08/31 0800)  Respirations: 18 PER MINUTE (08/31 0800)  SpO2: 93 % (08/31 0800)  SpO2 Pulse: 63 (08/31 0300)  Height: 182.9 cm (6') (08/30 2007)  BP: (107-166)/(59-79)   Temp:  [36.2 ???C (97.2 ???F)-36.6 ???C (97.9 ???F)]   Pulse:  [60-66]   Respirations:  [11 PER MINUTE-25 PER MINUTE]   SpO2:  [92 %-96 %]            Wt Readings from Last 10 Encounters:   09/28/18 80.2 kg (176 lb 12.9 oz)   09/24/18 80.7 kg (178 lb)   09/10/18 87 kg (191 lb 12.8 oz)   07/16/18 97.3 kg (214 lb 9.6 oz)   05/14/18 96.2 kg (212 lb)   04/07/18 97.5 kg (215 lb)   03/10/18 96.8 kg (213 lb 4.8 oz)   08/28/17 100.6 kg (221 lb 12.8 oz) 02/25/17 101.6 kg (224 lb)   08/22/16 102.5 kg (226 lb)       Physical Exam:    General Appearance: no acute distress  Skin: warm and dry  Lips & Oral Mucosa: no pallor or cyanosis   Digits and Nails: normal color, smooth symmetric nails and digits  Eyes: conjunctivae and lids normal  Neck Veins: JVP 8-10cm, HJR positive  Auscultation/Percussion: decreased bilateral lower lobes, no rales or rhonchi, no wheezing  Cardiac Auscultation: Regular rhythm, S1, S2, no S3 or S4, no murmur  Pedal Pulses: pulses 2+, symmetric  Lower Extremity Edema: no LE edema   Abdominal Exam: soft, non-tender, bowel sounds normal  Orientation: clear historian, fair insight    Laboratory Review:   CBC w/Diff    Lab Results   Component Value Date/Time    WBC 5.0 09/27/2018 09:30 PM    RBC 5.83 (H) 09/27/2018 09:30 PM    HGB 18.1 (H) 09/27/2018 09:30 PM    HCT 54.4 (H) 09/27/2018 09:30 PM    MCV 93.4 09/27/2018 09:30 PM    MCH 31.1 09/27/2018 09:30 PM    MCHC 33.3 09/27/2018 09:30 PM    RDW 16.3 (H) 09/27/2018 09:30 PM    PLTCT 174 09/27/2018 09:30 PM    MPV 9.0 09/27/2018 09:30 PM    Lab Results   Component Value Date/Time    NEUT 80 (H) 09/27/2018 09:30 PM    ANC 3.98 09/27/2018 09:30 PM    LYMA 10 (L) 09/27/2018 09:30 PM    ALC 0.52 (L) 09/27/2018 09:30 PM    MONA 8 09/27/2018 09:30 PM    AMC 0.38 09/27/2018 09:30 PM    EOSA 1 09/27/2018 09:30 PM    AEC 0.05 09/27/2018 09:30 PM    BASA 1 09/27/2018 09:30 PM    ABC 0.05 09/27/2018 09:30 PM         Chemistry    Lab Results   Component Value Date/Time    NA 134 (L) 09/27/2018 11:49 PM    K 4.3 09/27/2018 11:49 PM    CL 96 (L) 09/27/2018 11:49 PM    CO2 29 09/27/2018 11:49 PM    GAP 9 09/27/2018 11:49 PM    BUN 26 (H) 09/27/2018 11:49 PM    CR 1.44 (H) 09/27/2018 11:49 PM    GLU 95 09/27/2018 11:49 PM    Lab Results   Component Value  Date/Time    CA 8.4 (L) 09/27/2018 11:49 PM    PO4 2.5 09/27/2018 11:49 PM    ALBUMIN 3.5 09/27/2018 11:49 PM    TOTPROT 6.8 09/27/2018 11:49 PM ALKPHOS 151 (H) 09/27/2018 11:49 PM    AST 42 (H) 09/27/2018 11:49 PM    ALT 53 09/27/2018 11:49 PM    TOTBILI 1.3 (H) 09/27/2018 11:49 PM    GFR 47 (L) 09/27/2018 11:49 PM    GFRAA 57 (L) 09/27/2018 11:49 PM            Renal Function    Lab Results   Component Value Date/Time    NA 134 (L) 09/27/2018 11:49 PM    K 4.3 09/27/2018 11:49 PM    CL 96 (L) 09/27/2018 11:49 PM    CO2 29 09/27/2018 11:49 PM    GAP 9 09/27/2018 11:49 PM    BUN 26 (H) 09/27/2018 11:49 PM    BUN 37 (H) 09/09/2018 07:33 AM    BUN 33 (H) 09/08/2018 06:32 AM    Lab Results   Component Value Date/Time    CR 1.44 (H) 09/27/2018 11:49 PM    CR 1.57 (H) 09/09/2018 07:33 AM    CR 1.54 (H) 09/08/2018 06:32 AM    GLU 95 09/27/2018 11:49 PM    CA 8.4 (L) 09/27/2018 11:49 PM    PO4 2.5 09/27/2018 11:49 PM    ALBUMIN 3.5 09/27/2018 11:49 PM        Lipid Profile INR   Lab Results   Component Value Date    CHOL 88 09/05/2018    TRIG 80 09/05/2018    HDL 31 (L) 09/05/2018    LDL 48 09/05/2018    VLDL 16 09/05/2018    NONHDLCHOL 57 09/05/2018    CHOLHDLC 4 04/17/2012         Lab Results   Component Value Date    INR 1.4 (H) 09/09/2018          Chest X-Ray: 09/27/2018  Mild cardiomegaly with development of pulmonary venous congestion and   edema consistent with CHF/volume overload.      ECG: atrial ventricular dual paced rhythm HR 60    Tele: AV paced rhythm     Echocardiogram Details:   Echo Results  (Last 3 results in the past 3 years)    Echo EF LVIDD LA Size IVS LVPW Rest PAP    (09/04/18)  20 (09/04/18)  6.39 (09/04/18)  3.92 (09/04/18)  1.00 (09/04/18)  0.78 (09/04/18)  46

## 2018-09-28 NOTE — Care Coordination-Inpatient
Pt COVID returned + and transferred to [employer] unit.  Pt will move from Med Private T to Med Private B.  Cross covering physician is same for both teams in evening.night.      [place], DO  Internal Medicine

## 2018-09-28 NOTE — Progress Notes
Patient arrived to room # [phone]) via wheelchair accompanied by transport. Patient transferred to the bed without assistance. Bedside safety checks completed. Initial patient assessment completed. Refer to flowsheet for details.    Admission skin assessment completed with: Cristie Hem, RN    Pressure injury present on arrival?: No    1. Head/Face/Neck: No  2. Trunk/Back: No  3. Upper Extremities: No  4. Lower Extremities: No  5. Pelvic/Coccyx: No  6. Assessed for device associated injury? Yes  7. Malnutrition Screening Tool (Nursing Nutrition Assessment) Completed? No    See Doc Flowsheet for additional wound details.     INTERVENTIONS:

## 2018-09-28 NOTE — ED Notes
Medtronic interrogated at this time.

## 2018-09-28 NOTE — Progress Notes
COVID positive test result reported to patient's state health department.    Contact [facility] on call with any questions at [phone].

## 2018-09-28 NOTE — Case Management (ED)
Case Management Admission Assessment    NAME:Logan Bright                          MRN: [id number]             DOB:Dec 02, 1935          AGE: 83 y.o.  ADMISSION DATE: 09/27/2018             DAYS ADMITTED: LOS: 0 days      Today???s Date: 09/28/2018    Source of Information: Patient and review of EMR.  Patient was discharge on 09/10/18.    83 years old Caucasian male with a past medical history of paroxysmal atrial fibrillation on anticoagulation, neurocardiogenic syncope, HTN, HLD, CKD, gout, chronic combined CHF status post ICD, history of VT status post VT ablation, CAD status post stent placement who presented to the ER today with near syncope     Plan  Plan: Case Management Assessment, Assist PRN with SW/NCM Services, Discharge Planning for Home Anticipated   ??? This CM met with pt for assessment on this date.  Provided contact information and explanation of SW/NCM roles.  Reviewed [facility], Preparing for Discharge, and Preferred Provider Network hand-outs.  Provided opportunity for questions and discussion. Pt/family encouraged to contact Case Management team with questions and concerns during hospitalization and until patient is able to transition back to the patient's primary care physician.  ??? Patient lives alone in his home.  A neighbor or family provide transportation, grocery shopping, and medication set-up.  ??? Patient requires no DME at home.  ??? Patient is current to [facility] for nursing, PT, OT, and speech. Cicero Duck was notified of patient's admission and positive COVID-19 results.  ROC orders will be needed upon discharge.  ??? Patient may require transportation at discharge due.  Neighbor was provides transportation may not be able to transport patient due to COVID-19 dx.  ??? Patient has previously used [facility].  ??? Patient is a readmission from 09/04/18-09/10/18  ??? Huddle plan:  Diureses with IV Lasix and CHF consult.    Patient Address/Phone  [address] [place] [address]  [phone] (home)     Emergency Contact  Extended Emergency Contact Information  Primary Emergency Contact: Arbie [place]  Home Phone: [phone]  Mobile Phone: [phone]  Relation: Friend    Healthcare Directive         Transportation  Does the patient need discharge transport arranged?: No  Transportation Name, Phone and Availability #1: Tommie Sams [phone]  Does the patient use Medicaid Transportation?: No    Expected Discharge Date  Expected Discharge Date: 09/29/18  Expected Discharge Time: 1400    Living Situation Prior to Admission  ? Living Arrangements  Type of Residence: Home, independent  Living Arrangements: Alone  Financial risk analyst / Tub: Tub/Shower Unit  How many levels in the residence?: 1  Can patient live on one level if needed?: Yes  Does residence have entry and/or side stairs?: Yes(1 entry step)  Assistance needed prior to admit or anticipated on discharge: No  Who provides assistance or could if needed?: Family or neighbor Chaya Jan  Are they in good health?: Unknown  Can support system provide 24/7 care if needed?: No  ? Level of Function   Prior level of function: Independent(Patient does not drive himself  - Tommie Sams provides transportation.)  ? Cognitive Abilities   Cognitive Abilities:  Alert and Oriented, Engages in problem solving and planning, Participates in decision making    Financial Resources  ? Coverage  Primary Insurance: Medicare(Part A & B)  Secondary Insurance: Medicare Supplement(CIGNA Medicare.)  Additional Coverage: RX(Fills Rx at [facility] in [facility] and reports copays have been affordable.)    ? Source of Income   Source Of Income: SSI  ? Financial Assistance Needed?  None at current    Psychosocial Needs  ? Mental Health  Mental Health History: No  ? Substance Use History  Substance Use History Screen: No  ? Other  None at current.    Current/Previous Services  ? PCP  Vanice Sarah, [phone], [phone] ? Pharmacy    [facility] [phone] - [place] - [address] AT [facility] [address]  [address]  [place] [address]  Phone: [phone] Fax: [phone]    [facility]  [address]  [place] [address]  Phone: [phone] Fax: [phone]    MEDICATION ASSISTANCE [facility]  [address]  [address]  [place] [address]  Phone: [phone] Fax: [phone]    ? Durable Medical Equipment   Durable Medical Equipment at home: None  ? Home Health  Receiving home health: Yes  Agency name: [facility]  Would patient use this agency again?: Yes  ? Hemodialysis or Peritoneal Dialysis  Undergoing hemodialysis or peritoneal dialysis: No  ? Tube/Enteral Feeds  Receive tube/enteral feeds: No  ? Infusion  Receive infusions: No  ? Private Duty  Private duty help used: No  ? Home and Community Based Services  Home and community based services: No  ? [employer]: N/A  ? Hospice  Hospice: No  ? Outpatient Therapy  PT: Yes  When did patient receive care?: Current  Name of rehab location/group: [facility]  Would patient return for future services?: Yes  OT: Yes  When did patient receive care?: Current  Name of rehab location/group: [facility]  Would patient return for future services?: Yes  SLP: No  ? Skilled Nursing Facility/Nursing Home  SNF: In the past  Name of Facility: [facility] at [facility]  Would patient return for future services?: Yes  NH: No  ? Inpatient Rehab  IPR: No  ? Long-Term Acute Care Hospital  LTACH: No  ? Acute Hospital Stay  Acute Hospital Stay: Yes  Was patient's stay within the last 30 days?: Yes  Name of Hospital: [facility]  When did patient receive care?: 09/04/18-09/10/18  Readmission Code Group: 7. Advancement of Disease / Chronic Illness Management  7. Advancement of Disease / Chronic Illness Management: 7a1. Cardiac: CHF / AMI    Rolan Lipa RN, MSN, Barista Case Manager [phone]  Office  [phone]  [place]

## 2018-09-28 NOTE — Progress Notes
Pt AV paced on tele. Tolerating RA. UOP adequate, +BM this shift. Denies pain/SOB/Dizziness. Will continue to monitor.     I agree with the assessments and documentation of Darlyn Chamber, RN.

## 2018-09-29 ENCOUNTER — Encounter: Admit: 2018-09-29 | Discharge: 2018-09-29 | Primary: Cardiovascular Disease

## 2018-09-29 DIAGNOSIS — R42 Dizziness and giddiness: Secondary | ICD-10-CM

## 2018-09-29 LAB — PERIPHERAL SMEAR

## 2018-09-29 LAB — CBC AND DIFF: Lab: 4.5 K/UL (ref 4.5–11.0)

## 2018-09-29 LAB — COMPREHENSIVE METABOLIC PANEL: Lab: 133 MMOL/L — ABNORMAL LOW (ref 137–147)

## 2018-09-29 LAB — MAGNESIUM: Lab: 1.8 mg/dL — ABNORMAL LOW (ref >60)

## 2018-09-29 MED ORDER — MAGNESIUM SULFATE IN D5W 1 GRAM/100 ML IV PGBK
1 g | INTRAVENOUS | 0 refills | Status: CP
Start: 2018-09-29 — End: ?
  Administered 2018-09-29 (×2): 1 g via INTRAVENOUS

## 2018-09-29 MED ORDER — FUROSEMIDE 20 MG PO TAB
40 mg | Freq: Every day | ORAL | 0 refills | Status: DC
Start: 2018-09-29 — End: 2018-10-01
  Administered 2018-09-30 – 2018-10-01 (×2): 40 mg via ORAL

## 2018-09-29 NOTE — Progress Notes
Inpatient Progress Note        Name:  Logan Bright   Medical Record Number: [id number]          Account Number:  [id number]  Date Of Birth:  1935-04-09                           Room and Bed Number -  BH4507/01  Today's Date:  09/29/2018  LOS: 1 day    Age: 83 years  male    Chief Complaint:   Chief Complaint   Patient presents with   ??? Fall     got lightheaded at church and sat down in chair, new pacemaker placed recently   on 09/27/2018    Admitting diagnosis: Acute on chronic combined systolic (congestive) and diastolic (congestive) heart failure (HCC)    Assessment     Principal Problem:    Acute on chronic combined systolic (congestive) and diastolic (congestive) heart failure (HCC)      This is an 83 years old Caucasian male with a past medical history of paroxysmal atrial fibrillation on anticoagulation, neurocardiogenic syncope, HTN, HLD, CKD, gout, chronic combined CHF status post ICD, history of VT status post VT ablation, CAD status post stent placement who presented to the ER today with near syncope     Fall: most likely MSK etiology ( deconditioning and malaise) rather than syncope  Hx of neurocardiogenic syncope  - VS in the ED are wnl  - Labs within his baseline  - AOx3 in the ED, No focal neurological deficits on exam, no infectious symptomatology/SIRS on admission  >  UA clean on admisison   > PT/OT  > Device interrogation: No arrhythmias/high rate episodes detected since last interrogation.Device appears to be currently functioning as programmed  -Avoid tight control of blood pressure.  Coreg was switched to metoprolol     Acute on chronic combined CHF s/p CRT-d (upgarded from ICD 08/2018)   - Most recent ECHO with EF 20%, grade 2 DD, elevated filling pressures without a significant valvular abnormalities  - Denies any DOE/LE edema but does have new onset cough  - His weight has drooped compared to a month ago although its up 9 pounds since he was last seen 08/27. His CXR is slightly worse/CT chest showed interstitial edema  > Lasix 40 mg IV X2   > Consulted CHF  -Avoid tight control of blood pressure.   > Consider palliative care medicine   -Replace potassium and magnesium     PAF  Hx of VT  > Contineu APixaban, device interrogation done as above  > Continue Amiodarone 200 mg QD     CAD s/p PCI  > Contineu ASA     Hypothyroidism   > Continue Synthroid     CKD stage IIIa-b  - Cr at baseline    Erythrocytosis  -Could be related to sleep apnea?  -Check blood smear  -Erythropoietin    D-dimer elevation-most likely due to COVID-19  -CT with contrast negative for PE on admission  -Lower extremity Doppler negative for DVT on September 1    Total floor/unit time (reviewing and writing notes, examining the patient, reviewing test results etc) spent was 35 minutes of which > 50% was spent in care coordination  and bedside counseling (explaining treatment options, disease processes, laboratory/imaging results, prognosis, risks and benefits of treatment options, medication side effects, importance of compliance with treatment, risk factor reduction, follow up with primary care  physician).    This note  was created using  [employer], hence  some grammatical errors may still be present despite  editing at the time of the dictation, )    Plan:      ???  expected discharge on September 2?  ???  Diet Cardiac (Low Fat/Low Sodium)  ???  records from previous encounters have been reviewed.  ???  results, diagnoses, prognoses were reviewed with the patients    ???  direct visualization and independent view of the chest x-ray, CTA was performed.   ??? Radiology, imaging, labs were reviewed and summarized as above.    ??? Benefits / risks were explained in details  ??? pt was educated, informed about the importance of compliance and instructed to follow up with the heart failure specialist and primary care ??? discussed with RN, pharmacy, and SW in multidisciplinary round   ???  PT/ OT     Subjective:      Pt seen and Chart reviewed.   No issue overnight.  Feeling better.  No dizziness anymore.  He reported that he felt his lower extremity gave up on him and he lowered himself.  He did not lose his consciousness.  He did not feel palpitation, chest pain, shortness of breath, vomiting or diarrhea.  He would like to go home tomorrow    ROS:      A comprehensive 10-point ROS was negative except for as above    Medications:    Scheduled Meds:allopurinoL (ZYLOPRIM) tablet 100 mg, 100 mg, Oral, QHS  amiodarone (CORDARONE) tablet 200 mg, 200 mg, Oral, BID  apixaban (ELIQUIS) tablet 2.5 mg, 2.5 mg, Oral, BID  aspirin EC tablet 81 mg, 81 mg, Oral, QDAY  furosemide (LASIX) injection 40 mg, 40 mg, Intravenous, BID(9-17)  levothyroxine (SYNTHROID) tablet 50 mcg, 50 mcg, Oral, QDAY 30 min before breakfast  metoprolol XL (TOPROL XL) tablet 25 mg, 25 mg, Oral, QDAY  potassium chloride SR (K-DUR) tablet 10 mEq, 10 mEq, Oral, BID w/meals    Continuous Infusions:  PRN and Respiratory Meds:      Objective:                            Vital Signs: Last Filed                 Vital Signs: 24 Hour Range   BP: 121/64 (09/01 1237)  Temp: 36.4 ???C (97.5 ???F) (09/01 1237)  Pulse: 60 (09/01 1237)  Respirations: 20 PER MINUTE (09/01 1237)  SpO2: 99 % (09/01 1237) BP: (119-133)/(64-84)   Temp:  [36.4 ???C (97.5 ???F)-37 ???C (98.6 ???F)]   Pulse:  [59-63]   Respirations:  [18 PER MINUTE-21 PER MINUTE]   SpO2:  [93 %-99 %]      Vitals:    09/28/18 0446 09/28/18 0449 09/29/18 0357   Weight: 79.9 kg (176 lb 3.2 oz) 80.2 kg (176 lb 12.9 oz) 77.8 kg (171 lb 8.3 oz)         Intake/Output Summary:  (Last 24 hours)    Intake/Output Summary (Last 24 hours) at 09/29/2018 1317  Last data filed at 09/29/2018 1237  Gross per 24 hour   Intake 830 ml   Output 1900 ml   Net -1070 ml      Stool Occurrence: 0    Physical Exam General: Patient seems to be clinically stable and in no immediate distress. AAO x3   HEENT: NC/AT  Heart: S1 S2 present, RRR, no audible rubs or murmurs heard  Lungs: Bilateral air entry noted, normal breath sounds and basal crackles  Abdomen: Soft, non tender and no masses noted. Bowel sounds were present.   Extremities: + pedal edema; no calf tenderness  Skin: No obvious skin rashes   Neurologic: Seems oriented. No new obvious abnormality of cranial nerves, motor system noted      Labs reviwed, Notable for:        Recent Labs     09/27/18  2349 09/28/18  1123 09/29/18  0550   NA 134* 134* 133*   K 4.3 3.8 3.7   CL 96* 98 95*   CO2 29 27 25    GAP 9 9 13*   BUN 26* 26* 25   CR 1.44* 1.43* 1.41*   GLU 95 119* 91   CA 8.4* 7.9* 8.0*   ALBUMIN 3.5  --  3.1*   MG 2.0 1.8 1.8   PO4 2.5  --   --    TSH 3.00  --   --        Recent Labs     09/27/18  2130 09/27/18  2349 09/29/18  0550   WBC 5.0  --  4.5   HGB 18.1*  --  17.8*   HCT 54.4*  --  53.0*   PLTCT 174  --  137*   AST  --  42* 30   ALT  --  53 34   ALKPHOS  --  151* 120*      Estimated Creatinine Clearance: 45.2 mL/min (A) (based on SCr of 1.41 mg/dL (H)).  Vitals:    09/28/18 0446 09/28/18 0449 09/29/18 0357   Weight: 79.9 kg (176 lb 3.2 oz) 80.2 kg (176 lb 12.9 oz) 77.8 kg (171 lb 8.3 oz)         Resulted Micro Last 72 Hrs    No results found

## 2018-09-29 NOTE — Progress Notes
Heart Failure Progress Note    NAME:Logan Bright             MRN: [id number]                 DOB:[birth year]          AGE: 83 y.o.  ADMISSION DATE: 09/27/2018             DAYS ADMITTED: LOS: 1 day      Principal Problem:    Acute on chronic combined systolic (congestive) and diastolic (congestive) heart failure (HCC)      83 y.o. male who we are asked to see for evaluation of heart failure. Patient with history of chronic combined HFrEF s/p CRT-D, history of VT s/p VT ablation, CAD s/p stent placement, paroxysmal atrial fibrillation on anticoagulation, HTN, HLD, CKD,   neurocardiogenic syncope, gout. Presented to the ED with near syncope.     Recommendations:  Today:  1. Switch PTA coreg to Toprol XL 25 mg daily-done.  2. Discontinue Lasix 40 mg IV BID (received dose this am)-done. Start PTA Lasix 40 mg PO daily tomorrow-ordered.  Net I/O for entire hospital stay: -3,155 mL, net for last 24 hrs -2,455 mL. Scr was 1.43 today and 1.44 yesterday.  3. Continue PTA apixaban 2.5 mg BID for AC for PAF.  4. Dietician consultation to discuss sodium restricted diet recommended.   Ongoing:  1. BMP once a day/BID while on diuretic gtt. Magnesium level daily.  Keep Potassium greater than 4.0 and Magnesium greater than 2.0.  2. 2000mg  sodium dietary restriction.  3. Fluid Restriction:1.5  4. Strict I/O. Goal output: net neg 1-2L/24 hour  5. Daily standing scale weight. Goal Dry Weight: unknown     Primary team responsible for placing Post???Discharge Health System Appointment Request order set for Cardiology: Heart Failure appointment request within 24-48 hours of discharge. (Please do not place order any earlier in effort to reduce possible need for cancellation and rescheduling of visit). It should be ordered for TeleHealth Visit in comments due to patient is COVID 19 positive.   -Cardiology will sign off but is available as needed.     Pt discussed with Dr. Duane Lope.  -I discussed above plan with Primary team. Paticia Stack, APRN-NP  Department of Cardiovascular Medicine  [facility]  Pager [phone]  Available on Voalte  ___________________________________________________________________________________________________    Assessment:   Acute on chronic systolic and diastolic HFrEF,  EF: 20% per Echo 09/04/2018.  Major Complications or Comorbidities [facility]): acute/ acute on chronic systolic and/or diastolic heart failure  NYHA functional class III (marked limitation of physical activity - comfortable at rest, but less than ordinary activity causes symptoms of HF e.g., getting dressed or standing from a sitting position),   ACC Stage C (structural heart disease with prior or current symptoms of HF).   He presents with signs of hypervolemia without signs of low flow state.  -09/27/2018 CXR showed Mild cardiomegaly with development of pulmonary venous congestion and edema consistent with CHF/volume overload.   -09/28/2018 CTA chest revealed No PE, ???Mild cardiomegaly, bilateral pleural effusions, and mild interstitial pulmonary edema, with combination of findings most consistent with CHF/fluid overload. Reflux of contrast in the upper IVC and hepatic veins is also consistent with CHF/elevated right heart pressures.   Admission BNP: POC: 593  Admission Weight: 84.4 kg (186 lb)        Most recent weights (inpatient):   Vitals:    09/28/18 0446 09/28/18 0449  09/29/18 0357   Weight: 79.9 kg (176 lb 3.2 oz) 80.2 kg (176 lb 12.9 oz) 77.8 kg (171 lb 8.3 oz)          Intake/Output Summary (Last 24 hours) at 09/29/2018 0849  Last data filed at 09/29/2018 1610  Gross per 24 hour   Intake 420 ml   Output 2525 ml   Net -2105 ml       Diuretic Therapy    Prior to admission dose    Lasix 40 mg daily   Given on admission       Daily Dosing   8/31 Lasix 40 mg IV x2, then Lasix 40 mg IV BID   9/1 Discontinue Lasix 40 mg IV BID (received dose this am). Start Lasix 40 mg PO daily tomorrow     GDMT PTA Changes BB Coreg 6.25 mg BID Continued   8/31 Hold evening dose of coreg   9/1switch to Toprol XL 25 mg daily   ACEI/ARB/ARNI  Losartan 25 mg daily discontinued on 8/27 due to hypotension     Aldosterone Antagonist  No     Hydralazine/Nitrate No     Ivabradine No    HRMT  Yes (CRT-D)     Anticoagulation for Afib/flutter  Yes, apixaban 2.5 mg BID Continued       Ischemic Cardiomyopathy-EF 20%  -09/09/2018 s/p Medtronic CRT-D  Successful Dual-chamber cardiac resynchronization therapy (CRT) defibrillator upgrade (Medtronic) with addition of LV coronary venous lead to pre-existing dual-chamber ICD    CAD  -Hx CAD PCI with stents in [facility] at [facility]    PAF  History of VT   s/p VT ablation in 04/2013 by Dr. Wallene Huh  -Continue PTA  APixaban, device interrogation  -Continue PTA Amiodarone 200 mg daily     HTN  -stable SBP 107-130s    CKD   -Baseline Scr ~1.5-1.7  -admission Scr 1.44>1.43>1.41 today    COVID 19 positive on 09/28/18  -May be contributing to underlying exacerbation  -Primary will transfer to COVID service for tomorrow    D-dimer elevation-most likely due to COVID-19  -CT with contrast negative for PE on admission  -Lower extremity Doppler negative for DVT on September 1  _____________________________________________________________________________    Subjective:  Dr. Ronald Pippins spoke with patient on the phone and asked him the following questions.   He reports feeling better than yesterday.     He currently denies shortness of breath, paroxysmal nocturnal dyspnea, palpitations, chest pain, abdominal fullness, peripheral edema and weight change.    Review of Systems:  A 14 point review of systems was ascertained and is otherwise negative and/or normal except as noted above.       Objective:    Allergies:   Allergies   Allergen Reactions   ??? Chlorhexidine Gluconate RASH   ??? Colchicine NAUSEA AND VOMITING        Medications:  Scheduled Meds:allopurinoL (ZYLOPRIM) tablet 100 mg, 100 mg, Oral, QHS amiodarone (CORDARONE) tablet 200 mg, 200 mg, Oral, BID  apixaban (ELIQUIS) tablet 2.5 mg, 2.5 mg, Oral, BID  aspirin EC tablet 81 mg, 81 mg, Oral, QDAY  furosemide (LASIX) injection 40 mg, 40 mg, Intravenous, BID(9-17)  levothyroxine (SYNTHROID) tablet 50 mcg, 50 mcg, Oral, QDAY 30 min before breakfast  magnesium sulfate   1 g/D5W 100 mL IVPB, 1 g, Intravenous, Q1H X 2DO  metoprolol XL (TOPROL XL) tablet 25 mg, 25 mg, Oral, QDAY  potassium chloride SR (K-DUR) tablet 10 mEq, 10 mEq, Oral, BID w/meals  Continuous Infusions:  PRN and Respiratory Meds:    Medications Prior to Admission   Medication Sig Dispense Refill Last Dose   ??? allopurinol (ZYLOPRIM) 100 mg tablet TAKE 1 TABLET BY MOUTH DAILY AT BEDTIME 90 tablet 3 09/26/2018   ??? amiodarone (CORDARONE) 200 mg tablet Take one tablet by mouth daily. Take with food. (Patient taking differently: Take 200 mg by mouth twice daily. Take with food.) 90 tablet 3 09/27/2018   ??? apixaban (ELIQUIS) 2.5 mg tablet Take one tablet by mouth twice daily. 60 tablet 1 09/27/2018   ??? aspirin EC 81 mg tablet Take 81 mg by mouth daily. Take with food.   09/27/2018   ??? carvediloL (COREG) 6.25 mg tablet Take one tablet by mouth twice daily with meals. Take with food. 180 tablet 1 09/27/2018   ??? furosemide (LASIX) 40 mg tablet Take one tablet by mouth daily. 90 tablet 3 09/27/2018   ??? levothyroxine (SYNTHROID) 50 mcg tablet Take one tablet by mouth daily 30 minutes before breakfast. 90 tablet 3 09/27/2018   ??? potassium chloride (K-DUR) 10 mEq tablet TAKE 1 TABLET BY MOUTH TWICE DAILY WITH A MEAL AND A FULL GLASS OF WATER 180 tablet 3 09/27/2018                             Vital Signs:  Last Filed                Vital Signs: 24 Hour Range   BP: 130/75 (09/01 0818)  Temp: 36.7 ???C (98 ???F) (09/01 0818)  Pulse: 63 (09/01 0818)  Respirations: 20 PER MINUTE (09/01 0818)  SpO2: 95 % (09/01 0818)  BP: (119-133)/(66-84)   Temp:  [35.9 ???C (96.6 ???F)-37 ???C (98.6 ???F)]   Pulse:  [59-63] Respirations:  [18 PER MINUTE-21 PER MINUTE]   SpO2:  [93 %-99 %]            Wt Readings from Last 10 Encounters:   09/29/18 77.8 kg (171 lb 8.3 oz)   09/24/18 80.7 kg (178 lb)   09/10/18 87 kg (191 lb 12.8 oz)   07/16/18 97.3 kg (214 lb 9.6 oz)   05/14/18 96.2 kg (212 lb)   04/07/18 97.5 kg (215 lb)   03/10/18 96.8 kg (213 lb 4.8 oz)   08/28/17 100.6 kg (221 lb 12.8 oz)   02/25/17 101.6 kg (224 lb)   08/22/16 102.5 kg (226 lb)       Physical Exam: was not performed by Dr. Duane Lope due to Patient is COVID 19 positive.      Laboratory Review:   CBC w/Diff    Lab Results   Component Value Date/Time    WBC 4.5 09/29/2018 05:50 AM    RBC 5.75 (H) 09/29/2018 05:50 AM    HGB 17.8 (H) 09/29/2018 05:50 AM    HCT 53.0 (H) 09/29/2018 05:50 AM    MCV 92.3 09/29/2018 05:50 AM    MCH 30.9 09/29/2018 05:50 AM    MCHC 33.5 09/29/2018 05:50 AM    RDW 15.9 (H) 09/29/2018 05:50 AM    PLTCT 137 (L) 09/29/2018 05:50 AM    MPV 8.4 09/29/2018 05:50 AM    Lab Results   Component Value Date/Time    NEUT 82 (H) 09/29/2018 05:50 AM    ANC 3.64 09/29/2018 05:50 AM    LYMA 8 (L) 09/29/2018 05:50 AM    ALC 0.38 (L) 09/29/2018 05:50 AM    MONA 9  09/29/2018 05:50 AM    AMC 0.41 09/29/2018 05:50 AM    EOSA 1 09/29/2018 05:50 AM    AEC 0.03 09/29/2018 05:50 AM    BASA 0 09/29/2018 05:50 AM    ABC 0.01 09/29/2018 05:50 AM         Chemistry    Lab Results   Component Value Date/Time    NA 133 (L) 09/29/2018 05:50 AM    K 3.7 09/29/2018 05:50 AM    CL 95 (L) 09/29/2018 05:50 AM    CO2 25 09/29/2018 05:50 AM    GAP 13 (H) 09/29/2018 05:50 AM    BUN 25 09/29/2018 05:50 AM    CR 1.41 (H) 09/29/2018 05:50 AM    GLU 91 09/29/2018 05:50 AM    Lab Results   Component Value Date/Time    CA 8.0 (L) 09/29/2018 05:50 AM    PO4 2.5 09/27/2018 11:49 PM    ALBUMIN 3.1 (L) 09/29/2018 05:50 AM    TOTPROT 6.0 09/29/2018 05:50 AM    ALKPHOS 120 (H) 09/29/2018 05:50 AM    AST 30 09/29/2018 05:50 AM    ALT 34 09/29/2018 05:50 AM    TOTBILI 1.5 (H) 09/29/2018 05:50 AM GFR 48 (L) 09/29/2018 05:50 AM    GFRAA 58 (L) 09/29/2018 05:50 AM            Renal Function    Lab Results   Component Value Date/Time    NA 133 (L) 09/29/2018 05:50 AM    K 3.7 09/29/2018 05:50 AM    CL 95 (L) 09/29/2018 05:50 AM    CO2 25 09/29/2018 05:50 AM    GAP 13 (H) 09/29/2018 05:50 AM    BUN 25 09/29/2018 05:50 AM    BUN 26 (H) 09/28/2018 11:23 AM    BUN 26 (H) 09/27/2018 11:49 PM    Lab Results   Component Value Date/Time    CR 1.41 (H) 09/29/2018 05:50 AM    CR 1.43 (H) 09/28/2018 11:23 AM    CR 1.44 (H) 09/27/2018 11:49 PM    GLU 91 09/29/2018 05:50 AM    CA 8.0 (L) 09/29/2018 05:50 AM    PO4 2.5 09/27/2018 11:49 PM    ALBUMIN 3.1 (L) 09/29/2018 05:50 AM        Lipid Profile INR   Lab Results   Component Value Date    CHOL 88 09/05/2018    TRIG 80 09/05/2018    HDL 31 (L) 09/05/2018    LDL 48 09/05/2018    VLDL 16 09/05/2018    NONHDLCHOL 57 09/05/2018    CHOLHDLC 4 04/17/2012         Lab Results   Component Value Date    INR 1.4 (H) 09/09/2018          Chest X-Ray: 09/27/2018  Mild cardiomegaly with development of pulmonary venous congestion and   edema consistent with CHF/volume overload.      Tele: AV paced rhythm     Echocardiogram Details:   Echo Results  (Last 3 results in the past 3 years)    Echo EF LVIDD LA Size IVS LVPW Rest PAP    (09/04/18)  20 (09/04/18)  6.39 (09/04/18)  3.92 (09/04/18)  1.00 (09/04/18)  0.78 (09/04/18)  46

## 2018-09-30 ENCOUNTER — Encounter: Admit: 2018-09-30 | Discharge: 2018-09-30 | Primary: Cardiovascular Disease

## 2018-09-30 DIAGNOSIS — R42 Dizziness and giddiness: Secondary | ICD-10-CM

## 2018-09-30 LAB — BILIRUBIN, DIRECT: Lab: 0.5 mg/dL — ABNORMAL HIGH (ref <0.4)

## 2018-09-30 LAB — ERYTHROPOIETIN: Lab: 9.8 mU/mL — ABNORMAL LOW (ref 3.7–29.5)

## 2018-09-30 LAB — CBC AND DIFF: Lab: 5.2 10*3/uL — ABNORMAL HIGH (ref >60)

## 2018-09-30 LAB — COMPREHENSIVE METABOLIC PANEL
Lab: 1.4 mg/dL — ABNORMAL HIGH (ref 0.4–1.24)
Lab: 133 MMOL/L — ABNORMAL LOW (ref >60)

## 2018-09-30 LAB — HEPATITIS PANEL, ACUTE

## 2018-09-30 LAB — LDH-LACTATE DEHYDROGENASE: Lab: 288 U/L — ABNORMAL HIGH (ref 100–210)

## 2018-09-30 LAB — HAPTOGLOBIN: Lab: 234 mg/dL — ABNORMAL HIGH (ref 16–200)

## 2018-09-30 LAB — MAGNESIUM: Lab: 2.2 mg/dL — ABNORMAL HIGH (ref 1.6–2.6)

## 2018-09-30 MED ORDER — POTASSIUM CHLORIDE 20 MEQ PO TBTQ
20 meq | Freq: Once | ORAL | 0 refills | Status: CP
Start: 2018-09-30 — End: ?
  Administered 2018-09-30: 17:00:00 20 meq via ORAL

## 2018-09-30 MED ORDER — AMIODARONE 200 MG PO TAB
200 mg | Freq: Every day | ORAL | 0 refills | Status: DC
Start: 2018-09-30 — End: 2018-10-01
  Administered 2018-10-01: 13:00:00 200 mg via ORAL

## 2018-09-30 MED ORDER — AMIODARONE 200 MG PO TAB
200 mg | ORAL_TABLET | Freq: Two times a day (BID) | ORAL | 1 refills | 42.00000 days | Status: DC
Start: 2018-09-30 — End: 2018-10-01
  Filled 2018-09-30: qty 60, 30d supply

## 2018-09-30 MED ORDER — METOPROLOL SUCCINATE 25 MG PO TB24
25 mg | ORAL_TABLET | Freq: Every day | ORAL | 1 refills | 90.00000 days | Status: DC
Start: 2018-09-30 — End: 2019-04-27
  Filled 2018-09-30: qty 30, 30d supply

## 2018-09-30 NOTE — Discharge Instructions
Please don't hesitate to contact your registered dietitian if needed:   Contact information:   Diekemper, MS, RD, LD, CNSC  Phone: [phone]  [email or web address]

## 2018-09-30 NOTE — Consults
CLINICAL NUTRITION                                                        Clinical Nutrition Education Consult Summary    Name: Logan Bright        MRN: [id number]          DOB: 06-27-1935          Age: 83 y.o.  Admission Date: 09/27/2018             LOS: 2 days    Recommendation:  ??? Continue current diet as ordered.   ??? Encourage protein consumption w/ each meal & consumption of 3 meals per day/avoiding skipping meals.   ??? Please offer Boost Nutrition Supplements during times of low appetite/patient refusing a meal.  ??? REC to check 25-OH vit D at some point to r/o deficiency; replace if <30ng/mL.   ? If vitamin D low, supplement w/ 4,000 units Vitamin D3 daily x 8 weeks.   ? Patient has not been taking supplementation PTA & levels could be low due to impaired renal function with current CKD3    Comments:  Kaycee Mcgaugh is an 83 YO M w/ past medical history of paroxysmal atrial fibrillation on anticoagulation, neurocardiogenic syncope, HTN, HLD, CKD, gout, chronic combined CHF status post ICD, history of VT status post VT ablation, CAD status post stent placement who presented to the ER 8/30 with near syncope. Covid positive + this admission. On 8/31 RD was consulted for HF Diet Education, education provided & pt found not to be at acute nutritional risk. PO intake has been stable this admission, weight down ~15# since admission, lasix on board. Plan for discharge today per EMR review    Dietitian consulted for Cardiac Diet & Fluid Restriction nutrition education. Spoke w/ patient today via telephone due to COVID19 precautions. Pt reported good appetite/PO intake, not following a specific diet PTA, though did state that he does not add salt to his foods. Reported no hx of cardiac education, therefore we discussed HF nutrition therapy in detail (see below for education summary). Pt verbalized understanding of nutrition education, had minimal questions. Contact info provided in d/c summary. Pt denied taking vitamin/minerals PTA, we discussed possible need for vitamin D supplementation. Continue to believe patient is not acute nutrition risk at this time, clinical nutrition will continue to monitor per protocol.     Clinical Nutrition Education Summary  Heart Failure Nutrition Education    Education on Low Sodium Diet was provided.  Topics included the following:   ? Indications for diet  ? Sodium limit of 2,000 milligrams daily  ? Foods recommended/not recommended  ? Sodium content of foods (examples)  ? Food preparation suggestions - avoiding adding salt to foods  ? Sodium-free seasonings  ? Eating out tips  ? Avoiding high consumption of fluids/water/juices  ??? Consumption of <50 ounces of water per day.  Written materials were provided.    Phone number was given for any further nutrition-related questions.    Nutrition Assessment of Patient:  Admit Weight: 80 kgBMI (Calculated): 25.23; BMI Categories Adult: Acceptable: 18.5-24.9; Appearance: Unable to observe  Pertinent Allergies/Intolerances: None per EMR  Pertinent Labs: Reviewed; Pertinent Meds: Reviewed;      Malnutrition Assessment:   Does not meet criteria   Diekemper, MS, RD, LD, CNSC    Voalte: [phone] (Preferred Communication Method)  Pager: [phone]*

## 2018-09-30 NOTE — Progress Notes
OCCUPATIONAL THERAPY  ASSESSMENT NOTE      Name: Logan Bright        MRN: [id number]          DOB: 1935/11/14          Age: 83 y.o.  Admission Date: 09/27/2018             LOS: 2 days      Mobility  Patient Turn/Position: Chair  Progressive Mobility Level: Walk in room  Distance Walked (feet): 30 ft  Level of Assistance: Stand by assistance  Assistive Device: None  Time Tolerated: 11-30 minutes  Activity Limited By: Weakness;Fatigue    Subjective  Pertinent Dx per Physician: This is an 83 years old Caucasian male with a past medical history of paroxysmal atrial fibrillation on anticoagulation, neurocardiogenic syncope, HTN, HLD, CKD, gout, chronic combined CHF status post ICD, history of VT status post VT ablation, CAD status post stent placement who presented to the ER today with near syncope  Precautions: Falls;Isolation  Pain / Complaints: Patient agrees to participate in therapy    Objective  Psychosocial Status: Willing and Cooperative to Participate    Home Living  Type of Home: House  Home Layout: Multi-level;Able to Live on Main Level w/Bedrm/Bathrm Access(1 step to enter)  Financial risk analyst / Tub: Tub/Shower Unit    Prior Function  Level Of Independence: Independent with ADLs and functional transfers;Needed assistance with homemaking  Lives With: Alone  Receives Help From: Friends;Neighbor  Homemaking Tasks: Laundry;Cleaning  Other Function Comments: Pt reports he has assistance with driving, cooking, and shopping.     ADL's  Where Assessed: Edge of Bed;In [place];Chair  Toileting Assist: Stand By Assist  Toileting Deficits: Perineal Hygiene;Clothing Management Up;Clothing Management Down;Supervision/Safety  Comment: Pt needing to use the restroom upon arrival.      ADL Mobility  Transfer Type: Sit to stand  Transfer: Assistance Level: Standby assist;From;Bed  Transfer: Assistive Device: None  Transfer: Type of Assistance: For safety considerations  Other Transfer Type: Sit to/from stand Other Transfer: Assistance Level: To/from;Toilet;Standby assist  Other Transfer: Assistive Device: None  Other Transfer: Type of Assistance: For safety considerations  End of Activity Status: Up in chair;Instructed patient to request assist with mobility;Instructed patient to use call light  Sitting Balance: Independent  Standing Balance: Standby assist  Gait Distance: 30 feet  Gait: Assistance Level: Standby assist  Gait: Assistive Device: None  Gait Comments: Pt steady throughout session    Activity Tolerance  Endurance: 2/5 Tolerates 10-20 Minutes Exercise w/Multiple Rests    Cognition  Overall Cognitive Status: WFL to Adequately Complete Self Care Tasks Safely  Attention: Awake/Alert    UE AROM  Grasp: Bilateral Grasp Functional for Activity    Education  Persons Educated: Patient  Barriers To Learning: None Noted  Teaching Methods: Verbal Instruction  Patient Response: Verbalized Understanding  Topics: Role of OT, Goals for Therapy  Goal Formulation: With Patient    Assessment  Assessment: Decreased Endurance  Prognosis: Good  Goal Formulation: Patient    AM-PAC 6 Clicks Daily Activity Inpatient  Putting on and taking off regular lower body clothes?: A Little  Bathing (Including washing, rinsing, drying): A Little  Toileting, which includes using toilet, bedpan, or urinal: None  Putting on and taking off regular upper body clothing: None  Taking care of personal grooming such as brushing teeth: None  Eating meals?: None  Daily Activity Raw Score: 22  Standardized (t-scale) score: 47.1  CMS 0-100% Score: 25.8  CMS  G Code Modifier: CJ    Plan  OT Frequency: 5x/week  OT Plan for Next Visit: ADLs for endurance    ADL Goals  Patient Will Perform All ADL's: w/ Stand By Assist    Functional Transfer Goals  Pt Will Perform All Functional Transfers: w/ Stand By Assist    OT Discharge Recommendations  Recommendation: Home/prior living situation  Recommendation for Therapy Post Discharge: Home health Patient Currently Requires Physical Assist With: All home functioning ADLs  Patient Currently Requires Equipment: Owns what is needed    Therapist: Janelle Floor, OTR/L [id number]  Date: 09/30/2018

## 2018-09-30 NOTE — Patient Education
On 09/30/2018, Francisca December "Barnabas Lister" Standard was counseled and provided with a list of his discharge medications as part of his After Visit Summary.  The patient was instructed to bring this medication list to his next doctor's appointment and to update the list with any medication changes.  Where indicated, the patient was provided with additional medication and/or disease-state information (HF).    All patient questions were answered and patient acknowledged understanding of the medications, side effects, and other pertinent medication information.    Tressie Stalker, [facility]  09/30/2018

## 2018-09-30 NOTE — Progress Notes
RT Exercise Oximetry Note    Name: Logan Bright "Logan Bright        MRN: [id number]          DOB: [birth year]          Age: 83 y.o.  Admission Date: 09/27/2018             LOS: 2 days         Date performed: 09/30/2018  Time performed: 1300  Time walked: 6 minutes    At Rest While Awake Results    Room air at rest while awake:  SpO2 = 100%    Oxygen dose required at rest while awake: 0 lpm with resulting SpO2 100%        With Exercise Results:    Room air SpO2 with exercise, if needed: 94    Oxygen required with exercise: 0 lpm with resulting SpO2 94%        This patient was tested on a continuous oxygen flow device.    Comments: pt. Was able to walk for 6 minutes continuously without desaturation in Spo2 or fatigue. Pt requires no oxygen with exercise        Therapist: Dionne Bucy, RT

## 2018-09-30 NOTE — Progress Notes
PHYSICAL THERAPY  NOTE      Name: Logan Bright "[name]        MRN: [id number]          DOB: Jun 15, 1935          Age: 83 y.o.  Admission Date: 09/27/2018             LOS: 2 days      Based on discussion with OT, patient's current level of function suggests that patient will progress with one discipline. PT will sign off at this time. Please re-consult if change in functional status occurs.    Therapist: Edman Circle, PT, DPT [phone]  Date: 09/30/2018

## 2018-09-30 NOTE — Progress Notes
Inpatient Progress Note        Name:  Kristi Hyer   Medical Record Number: [id number]          Account Number:  [id number]  Date Of Birth:  09-06-1935                           Room and Bed Number -  BH4507/01  Today's Date:  09/30/2018  LOS: 2 days    Age: 83 years  male    Chief Complaint:   Chief Complaint   Patient presents with   ??? Fall     got lightheaded at church and sat down in chair, new pacemaker placed recently   on 09/27/2018    Admitting diagnosis: Acute on chronic combined systolic (congestive) and diastolic (congestive) heart failure (HCC)    Assessment     Principal Problem:    Acute on chronic combined systolic (congestive) and diastolic (congestive) heart failure (HCC)      This is an 83 years old Caucasian male with a past medical history of paroxysmal atrial fibrillation on anticoagulation, neurocardiogenic syncope, HTN, HLD, CKD, gout, chronic combined CHF status post ICD, history of VT status post VT ablation, CAD status post stent placement who presented to the ER today with near syncope     Erythrocytosis  -Could be related to sleep apnea?  Interestingly his hemoglobin has been always between 7 and 8 till 2015 most likely due to CKD.  - blood smear: ERYTHROCYTOSIS PRESENT, WHICH CAN BE CAUSED BY DEHYDRATION, CHRONIC HYPOXIA,   HIGH AFFINITY HEMOGLOBINS, PARANEOPLASTIC SYNDROMES, AND POLYCYTHEMIA VERA. ABSOLUTE LYMPHOCYTOPENIA. MILD THROMBOCYTOPENIA WITH NORMAL PLATELET MORPHOLOGY   -Erythropoietin 9.   - check Jak2.   - may need hematology follow up.      Elevated bilirubin ( direct and indirect), asymptomatic  -Most likely due to congestive hepatopathy related to advanced end-stage heart failure  -Hepatitis panel negative  -Haptoglobin elevated.  Blood smear as above  -LDH elevation could be related to COVID-19 infection  -Check liver ultrasound.  If stable patient can be discharged and follow-up with primary care Fall: most likely MSK etiology ( deconditioning and malaise) rather than syncope  Hx of neurocardiogenic syncope  - VS in the ED are wnl  - Labs within his baseline  - AOx3 in the ED, No focal neurological deficits on exam, no infectious symptomatology/SIRS on admission  >  UA clean on admisison   > PT/OT.  Discussed with Occupational Therapy on September 2  > Device interrogation: No arrhythmias/high rate episodes detected since last interrogation.Device appears to be currently functioning as programmed  -Avoid tight control of blood pressure.  Coreg was switched to metoprolol     Acute ( resolved, treated) on chronic combined CHF s/p CRT-d (upgarded from ICD 08/2018)   - Most recent ECHO with EF 20%, grade 2 DD, elevated filling pressures without a significant valvular abnormalities  - Denies any DOE/LE edema but does have new onset cough  - His weight has drooped compared to a month ago although its up 9 pounds since he was last seen 08/27. His CXR is slightly worse/CT chest showed interstitial edema  > Lasix 40 mg PO.   > Consulted CHF  -Avoid tight control of blood pressure.   > Consider palliative care medicine   -Replace potassium and magnesium  -  On sept 2, Was able to walk for 6 minutes continuously without desaturation in Spo2 or fatigue.  Pt requires no oxygen with exercise     PAF  Hx of VT  > Contineu APixaban, device interrogation done as above  > Continue Amiodarone 200 mg QD. Will clarify with Cardio     CAD s/p PCI  > Contineu ASA     Hypothyroidism   > Continue Synthroid     CKD stage IIIa-b  - Cr at baseline    D-dimer elevation-most likely due to COVID-19  -CT with contrast negative for PE on admission  -Lower extremity Doppler negative for DVT on September 1    Patient will be discharged tomorrow to skilled nursing facility most likely    Total floor/unit time (reviewing and writing notes, examining the patient, reviewing test results etc) spent was 35 minutes of which > 50% was spent in care coordination  and bedside counseling (explaining treatment options, disease processes, laboratory/imaging results, prognosis, risks and benefits of treatment options, medication side effects, importance of compliance with treatment, risk factor reduction, follow up with primary care physician).    This note  was created using  [employer], hence  some grammatical errors may still be present despite  editing at the time of the dictation, )    Plan:      ???  expected discharge on September 3?   Diet Cardiac (Low Fat/Low Sodium)  ??? Cardiac Diet  ???  records from previous encounters have been reviewed.  ???  results, diagnoses, prognoses were reviewed with the patients    ???  direct visualization and independent view of the chest x-ray, CTA was performed.   ??? Radiology, imaging, labs were reviewed and summarized as above.    ??? Benefits / risks were explained in details  ??? pt was educated, informed about the importance of compliance and instructed to follow up with the heart failure specialist and primary care  ??? discussed with RN, pharmacy, and SW in multidisciplinary round   ???  PT/ OT     Subjective:      Pt seen and Chart reviewed.   No issue overnight.  Feeling better on room air.  No shortness of breath or chest pain.  No nausea or vomiting.  Had bowel movement with no diarrhea or constipation.  Ready to go home.  However, his family called the Child psychotherapist and they are not able to care for him.  He most likely will go to skilled nursing facility    ROS:      A comprehensive 10-point ROS was negative except for as above    Medications:    Scheduled Meds:allopurinoL (ZYLOPRIM) tablet 100 mg, 100 mg, Oral, QHS  amiodarone (CORDARONE) tablet 200 mg, 200 mg, Oral, BID  apixaban (ELIQUIS) tablet 2.5 mg, 2.5 mg, Oral, BID  aspirin EC tablet 81 mg, 81 mg, Oral, QDAY  furosemide (LASIX) tablet 40 mg, 40 mg, Oral, QDAY  levothyroxine (SYNTHROID) tablet 50 mcg, 50 mcg, Oral, QDAY 30 min before breakfast metoprolol XL (TOPROL XL) tablet 25 mg, 25 mg, Oral, QDAY  potassium chloride SR (K-DUR) tablet 10 mEq, 10 mEq, Oral, BID w/meals    Continuous Infusions:  PRN and Respiratory Meds:      Objective:                            Vital Signs: Last Filed                 Vital Signs: 24 Hour Range  BP: 104/63 (09/02 1054)  Temp: 36.3 ???C (97.3 ???F) (09/02 1054)  Pulse: 60 (09/02 1256)  Respirations: 18 PER MINUTE (09/02 1054)  SpO2: 94 % (09/02 1054) BP: (104-127)/(63-68)   Temp:  [36.3 ???C (97.3 ???F)-37.1 ???C (98.7 ???F)]   Pulse:  [58-62]   Respirations:  [16 PER MINUTE-18 PER MINUTE]   SpO2:  [92 %-97 %]      Vitals:    09/28/18 0446 09/28/18 0449 09/29/18 0357   Weight: 79.9 kg (176 lb 3.2 oz) 80.2 kg (176 lb 12.9 oz) 77.8 kg (171 lb 8.3 oz)         Intake/Output Summary:  (Last 24 hours)    Intake/Output Summary (Last 24 hours) at 09/30/2018 1448  Last data filed at 09/30/2018 0827  Gross per 24 hour   Intake 720 ml   Output 0 ml   Net 720 ml      Stool Occurrence: 0    Physical Exam    General: Patient seems to be clinically stable and in no immediate distress. AAO x3   HEENT: NC/AT  Heart: S1 S2 present, RRR, no audible rubs or murmurs heard  Lungs: Bilateral air entry noted, normal breath sounds    Abdomen: Soft, non tender and no masses noted. Bowel sounds were present.   Extremities: no pedal edema; no calf tenderness  Skin: No obvious skin rashes   Neurologic: Seems oriented. No new obvious abnormality of cranial nerves, motor system noted      Labs reviwed, Notable for:        Recent Labs     09/27/18  2349 09/28/18  1123 09/29/18  0550 09/30/18  0756   NA 134* 134* 133* 133*   K 4.3 3.8 3.7 3.8   CL 96* 98 95* 95*   CO2 29 27 25 27    GAP 9 9 13* 11   BUN 26* 26* 25 30*   CR 1.44* 1.43* 1.41* 1.45*   GLU 95 119* 91 107*   CA 8.4* 7.9* 8.0* 8.0*   ALBUMIN 3.5  --  3.1* 3.3*   MG 2.0 1.8 1.8 2.2   PO4 2.5  --   --   --    TSH 3.00  --   --   --        Recent Labs     09/27/18  2130 09/27/18  2349 09/29/18  0550 09/30/18 0756   WBC 5.0  --  4.5 5.2   HGB 18.1*  --  17.8* 17.6*   HCT 54.4*  --  53.0* 53.5*   PLTCT 174  --  137* 145*   AST  --  42* 30 25   ALT  --  53 34 28   ALKPHOS  --  151* 120* 129*      Estimated Creatinine Clearance: 44 mL/min (A) (based on SCr of 1.45 mg/dL (H)).  Vitals:    09/28/18 0446 09/28/18 0449 09/29/18 0357   Weight: 79.9 kg (176 lb 3.2 oz) 80.2 kg (176 lb 12.9 oz) 77.8 kg (171 lb 8.3 oz)         Resulted Micro Last 72 Hrs    No results found

## 2018-10-01 ENCOUNTER — Encounter: Admit: 2018-09-28 | Discharge: 2018-09-28 | Attending: Student in an Organized Health Care Education/Training Program

## 2018-10-01 ENCOUNTER — Encounter: Admit: 2018-10-01 | Discharge: 2018-10-01 | Primary: Cardiovascular Disease

## 2018-10-01 ENCOUNTER — Encounter: Admit: 2018-09-29 | Discharge: 2018-09-29 | Attending: Student in an Organized Health Care Education/Training Program

## 2018-10-01 ENCOUNTER — Encounter: Admit: 2018-09-30 | Discharge: 2018-09-30 | Attending: Student in an Organized Health Care Education/Training Program

## 2018-10-01 ENCOUNTER — Encounter
Admit: 2018-09-28 | Discharge: 2018-10-01 | Disposition: A | Discharge status: Skilled Nursing Facility | Attending: Student in an Organized Health Care Education/Training Program

## 2018-10-01 ENCOUNTER — Emergency Department: Admit: 2018-09-27 | Discharge: 2018-09-27 | Attending: Student in an Organized Health Care Education/Training Program

## 2018-10-01 DIAGNOSIS — Z7901 Long term (current) use of anticoagulants: Secondary | ICD-10-CM

## 2018-10-01 DIAGNOSIS — R55 Syncope and collapse: Secondary | ICD-10-CM

## 2018-10-01 DIAGNOSIS — Z950 Presence of cardiac pacemaker: Secondary | ICD-10-CM

## 2018-10-01 DIAGNOSIS — K761 Chronic passive congestion of liver: Secondary | ICD-10-CM

## 2018-10-01 DIAGNOSIS — E785 Hyperlipidemia, unspecified: Secondary | ICD-10-CM

## 2018-10-01 DIAGNOSIS — U071 COVID-19: Secondary | ICD-10-CM

## 2018-10-01 DIAGNOSIS — I130 Hypertensive heart and chronic kidney disease with heart failure and stage 1 through stage 4 chronic kidney disease, or unspecified chronic kidney disease: Secondary | ICD-10-CM

## 2018-10-01 DIAGNOSIS — E039 Hypothyroidism, unspecified: Secondary | ICD-10-CM

## 2018-10-01 DIAGNOSIS — M109 Gout, unspecified: Secondary | ICD-10-CM

## 2018-10-01 DIAGNOSIS — Z955 Presence of coronary angioplasty implant and graft: Secondary | ICD-10-CM

## 2018-10-01 DIAGNOSIS — D751 Secondary polycythemia: Secondary | ICD-10-CM

## 2018-10-01 DIAGNOSIS — I255 Ischemic cardiomyopathy: Secondary | ICD-10-CM

## 2018-10-01 DIAGNOSIS — I480 Paroxysmal atrial fibrillation: Secondary | ICD-10-CM

## 2018-10-01 DIAGNOSIS — I5043 Acute on chronic combined systolic (congestive) and diastolic (congestive) heart failure: Secondary | ICD-10-CM

## 2018-10-01 DIAGNOSIS — I2510 Atherosclerotic heart disease of native coronary artery without angina pectoris: Secondary | ICD-10-CM

## 2018-10-01 DIAGNOSIS — I5084 End stage heart failure: Secondary | ICD-10-CM

## 2018-10-01 DIAGNOSIS — Z95810 Presence of automatic (implantable) cardiac defibrillator: Secondary | ICD-10-CM

## 2018-10-01 DIAGNOSIS — N183 Chronic kidney disease, stage 3 (moderate): Secondary | ICD-10-CM

## 2018-10-01 LAB — MAGNESIUM: Lab: 2.3 mg/dL — ABNORMAL HIGH (ref >60)

## 2018-10-01 LAB — CBC AND DIFF: Lab: 5.1 K/UL — ABNORMAL LOW (ref 4.5–11.0)

## 2018-10-01 LAB — COMPREHENSIVE METABOLIC PANEL
Lab: 132 MMOL/L — ABNORMAL LOW (ref 137–147)
Lab: 95 MMOL/L — ABNORMAL LOW (ref >60)

## 2018-10-01 MED ORDER — POTASSIUM CHLORIDE 10 MEQ PO TBER
10 meq | ORAL_TABLET | Freq: Every day | ORAL | 0 refills | 30.00000 days | Status: AC
Start: 2018-10-01 — End: ?

## 2018-10-01 MED ORDER — AMIODARONE 200 MG PO TAB
200 mg | ORAL_TABLET | Freq: Every day | ORAL | 1 refills | 42.00000 days | Status: DC
Start: 2018-10-01 — End: 2018-10-19

## 2018-10-01 NOTE — Care Plan
Problem: Discharge Planning  Goal: Participation in plan of care  Outcome: Goal Achieved  Goal: Knowledge regarding plan of care  Outcome: Goal Achieved  Goal: Prepared for discharge  Outcome: Goal Achieved     Problem: Falls, High Risk of  Goal: Absence of falls-Adult Patient  Outcome: Goal Achieved  Goal: Absence of Falls-Pediatric patient  Outcome: Goal Achieved     Problem: Infection, Risk of  Goal: Absence of infection  Outcome: Goal Achieved  Goal: Knowledge of Infection Control Procedures  Outcome: Goal Achieved

## 2018-10-01 NOTE — Case Management (ED)
CMA Note:       This writer printed and placed transfer packet in pt's wallaroo.    Logan Bright  Case Management Assistant

## 2018-10-01 NOTE — Progress Notes
OCCUPATIONAL THERAPY  PROGRESS NOTE      Name: Logan Bright        MRN: [id number]          DOB: September 22, 1935          Age: 83 y.o.  Admission Date: 09/27/2018             LOS: 3 days      Mobility  Patient Turn/Position: Chair  Progressive Mobility Level: Walk in room  Distance Walked (feet): 10 ft  Level of Assistance: Stand by assistance  Assistive Device: None  Time Tolerated: 11-30 minutes  Activity Limited By: Weakness;Fatigue    Subjective  Pertinent Dx per Physician: This is an 83 years old Caucasian male with a past medical history of paroxysmal atrial fibrillation on anticoagulation, neurocardiogenic syncope, HTN, HLD, CKD, gout, chronic combined CHF status post ICD, history of VT status post VT ablation, CAD status post stent placement who presented to the ER today with near syncope  Precautions: Falls;Isolation  Pain / Complaints: Patient agrees to participate in therapy    Objective  Psychosocial Status: Willing and Cooperative to Participate    Home Living  Type of Home: House  Home Layout: Multi-level;Able to Live on Main Level w/Bedrm/Bathrm Access(1 step to enter)  Financial risk analyst / Tub: Tub/Shower Unit    Prior Function  Level Of Independence: Independent with ADLs and functional transfers;Needed assistance with homemaking  Lives With: Alone  Receives Help From: Friends;Neighbor  Homemaking Tasks: Laundry;Cleaning  Other Function Comments: Pt reports he has assistance with driving, cooking, and shopping.     ADL's  Where Assessed: In [place];Chair  Toileting Assist: Stand By Assist  Toileting Deficits: Clothing Management Up;Supervision/Safety  Comment: Pt in the bathroom upon arrival    ADL Mobility  Transfer Type: Sit to stand  Transfer: Assistance Level: From;Toilet;Modified independent  Transfer: Assistive Device: None  End of Activity Status: Instructed patient to request assist with mobility;Instructed patient to use call light;Up in chair  Gait Distance: 15 feet Gait: Assistance Level: Standby assist  Gait: Assistive Device: None  Gait Comments: Pt steady throughout session    Activity Tolerance  Endurance: 2/5 Tolerates 10-20 Minutes Exercise w/Multiple Rests    Cognition  Overall Cognitive Status: WFL to Adequately Complete Self Care Tasks Safely  Attention: Awake/Alert    UE AROM  Grasp: Bilateral Grasp Functional for Activity    Assessment  Assessment: Decreased Endurance  Prognosis: Good  Goal Formulation: Patient    AM-PAC 6 Clicks Daily Activity Inpatient  Putting on and taking off regular lower body clothes?: A Little  Bathing (Including washing, rinsing, drying): A Little  Toileting, which includes using toilet, bedpan, or urinal: None  Putting on and taking off regular upper body clothing: None  Taking care of personal grooming such as brushing teeth: None  Eating meals?: None  Daily Activity Raw Score: 22  Standardized (t-scale) score: 47.1  CMS 0-100% Score: 25.8  CMS G Code Modifier: CJ    Plan  OT Frequency: 5x/week  OT Plan for Next Visit: ADLs for endurance    ADL Goals  Patient Will Perform All ADL's: w/ Stand By Assist    Functional Transfer Goals  Pt Will Perform All Functional Transfers: w/ Stand By Assist    OT Discharge Recommendations  Recommendation: Inpatient setting  Patient Currently Requires Physical Assist With: All home functioning ADLs  Comments: Per chart review HH services are full and patients friend is unable to provide usual support  at this time of medication management, shopping, driving, etc. Due to new lack of support it would be safest for patient to d/c to an inpatient setting and work on higher level iADLs.      Therapist: Janelle Floor, OTR/L [phone]  Date: 10/01/2018

## 2018-10-01 NOTE — Progress Notes
Logan Bright "Logan Bright" Logan Bright discharged on 10/01/2018.   [name]  Discharge instructions reviewed with patient.  Valuables returned: All valuables returned   .  Home medications:    .  Functional assessment at discharge complete: Yes .    Patient given discharge instructions and all questions addressed. Telemetry pack taken off, cleaned and sent back to [facility]. Peripheral IV taken out. All valuables returned. Patient picked up by transport and sent to [facility]. Report given to receiving RN.

## 2018-10-01 NOTE — Care Plan
Problem: Discharge Planning  Goal: Participation in plan of care  Outcome: Goal Ongoing  Flowsheets (Taken 09/28/2018 1813 by Charlsie Merles, RN)  Participation in Plan of Care: Involve patient/caregiver in care planning decision making  Goal: Knowledge regarding plan of care  Outcome: Goal Ongoing  Flowsheets (Taken 10/01/2018 0308)  Knowledge regarding plan of care:   Provide plan of care education   Provide medication management education  Goal: Prepared for discharge  Outcome: Goal Ongoing  Flowsheets (Taken 10/01/2018 0308)  Prepared for discharge:   Complete ADL ability assessment   Collaborate with multidisciplinary team for hospital discharge coordination     Problem: Falls, High Risk of  Goal: Absence of falls-Adult Patient  Outcome: Goal Ongoing  Flowsheets (Taken 10/01/2018 0308)  Absence of falls-Adult Patient:   Complete Fall Risk Assessment.   Provide safe ambulation.   Provde safe environment.   Implement fall risk bundle.   Consider additional interventions if patient is confused, has gait/balance problems and on high risk medications.   Provide fall prevention strategies.  Goal: Absence of Falls-Pediatric patient  Outcome: Goal Ongoing     Problem: Infection, Risk of  Goal: Absence of infection  Outcome: Goal Ongoing  Flowsheets (Taken 10/01/2018 0308)  Absence of infection:   Assess for infection (Monitor SIRS Criteria)   Monitor for signs and symptoms of infection   Implement prevention measures as indicated  Goal: Knowledge of Infection Control Procedures  Outcome: Goal Ongoing  Flowsheets (Taken 09/28/2018 1813 by Charlsie Merles, RN)  Knowledge of Infection Control procedures: Provide Isolation Precautions Education

## 2018-10-01 NOTE — Progress Notes
Inpatient Progress Note        Name:  Logan Bright   Medical Record Number: [id number]          Account Number:  [id number]  Date Of Birth:  06-02-1935                           Room and Bed Number -  BH4507/01  Today's Date:  10/01/2018  LOS: 3 days    Age: 83 years  male    Chief Complaint:   Chief Complaint   Patient presents with   ??? Fall     got lightheaded at church and sat down in chair, new pacemaker placed recently   on 09/27/2018    Admitting diagnosis: Acute on chronic combined systolic (congestive) and diastolic (congestive) heart failure (HCC)    Assessment     Principal Problem:    Acute on chronic combined systolic (congestive) and diastolic (congestive) heart failure (HCC)      This is an 83 years old Caucasian male with a past medical history of paroxysmal atrial fibrillation on anticoagulation, neurocardiogenic syncope, HTN, HLD, CKD, gout, chronic combined CHF status post ICD, history of VT status post VT ablation, CAD status post stent placement who presented to the ER today with near syncope     Erythrocytosis  -Could be related to sleep apnea?  Interestingly his hemoglobin has been always between 7 and 8 till 2015 most likely due to CKD.  - blood smear: ERYTHROCYTOSIS PRESENT, WHICH CAN BE CAUSED BY DEHYDRATION, CHRONIC HYPOXIA,   HIGH AFFINITY HEMOGLOBINS, PARANEOPLASTIC SYNDROMES, AND POLYCYTHEMIA VERA. ABSOLUTE LYMPHOCYTOPENIA. MILD THROMBOCYTOPENIA WITH NORMAL PLATELET MORPHOLOGY   -Erythropoietin 9.   - f/u Jak2.   -  need hematology follow up.      Elevated bilirubin ( direct and indirect), asymptomatic  -Most likely due to congestive hepatopathy related to advanced end-stage heart failure  -Hepatitis panel negative  -Haptoglobin elevated.  Blood smear as above  -LDH elevation could be related to COVID-19 infection  -  liver ultrasound. Within acceptable limits. CT chest showed: Reflux of contrast in the upper IVC and hepatic veins is also consistent with CHF/elevated right heart pressures.  This fit diagnosis of congestive hepatopathy  - LFT in 1 week.     Fall: most likely MSK etiology ( deconditioning and malaise) rather than syncope: resolved.   Hx of neurocardiogenic syncope  - VS in the ED are wnl  - Labs within his baseline  - AOx3 in the ED, No focal neurological deficits on exam, no infectious symptomatology/SIRS on admission  >  UA clean on admisison   > PT/OT.  Discussed with Occupational Therapy on September 2  > Device interrogation: No arrhythmias/high rate episodes detected since last interrogation.Device appears to be currently functioning as programmed  -Avoid tight control of blood pressure.  Coreg was switched to metoprolol     Acute ( resolved, treated) on chronic combined CHF s/p CRT-d (upgarded from ICD 08/2018)   - Most recent ECHO with EF 20%, grade 2 DD, elevated filling pressures without a significant valvular abnormalities  - Denies any DOE/LE edema but does have new onset cough  - His weight has drooped compared to a month ago although its up 9 pounds since he was last seen 08/27. His CXR is slightly worse/CT chest showed interstitial edema  > Lasix 40 mg PO.   > Consulted CHF  -Avoid tight control of blood pressure.   >  Consider palliative care medicine   -Replace potassium and magnesium  -  On sept 2, Was able to walk for 6 minutes continuously without desaturation in Spo2 or fatigue. Pt requires no oxygen with exercise     PAF  Hx of VT  > Contineu APixaban, device interrogation done as above  > Continue Amiodarone 200 mg QD. Will clarify with Cardio     CAD s/p PCI  > Contineu ASA     Hypothyroidism   > Continue Synthroid     CKD stage IIIa-b  - Cr at baseline    D-dimer elevation-most likely due to COVID-19  -CT with contrast negative for PE on admission  -Lower extremity Doppler negative for DVT on September 1    Patient confirmed that the patient is going to make an appointment with a primary care physician in 1 week of discharge.  The patient was educated about the importance of compliance with the follow-up.  [He confirmed that as long as it will be covered by his insurance he will be able to maintain the appointment]    The discharge process including patient education took at least 35 minutes.       This note  was created using  [employer], hence  some grammatical errors may still be present despite  editing at the time of the dictation, )    Plan:      ???  expected discharge on September 3   Diet Cardiac (Low Fat/Low Sodium)  ??? Cardiac Diet  ???  records from previous encounters have been reviewed.  ???  results, diagnoses, prognoses were reviewed with the patients    ???  direct visualization and independent view of the chest x-ray, CTA was performed.   ??? Radiology, imaging, labs were reviewed and summarized as above.    ??? Benefits / risks were explained in details  ??? pt was educated, informed about the importance of compliance and instructed to follow up with the heart failure specialist and primary care  ??? discussed with RN, pharmacy, and SW in multidisciplinary round   ???  PT/ OT     Subjective:      Pt seen and Chart reviewed.   No issue overnight.  Feeling better on room air.  No shortness of breath or chest pain.  No nausea or vomiting.  Had bowel movement with no diarrhea or constipation.  Ready to go to SNF.   No abdominal pain    ROS:      A comprehensive 10-point ROS was negative except for as above    Medications:    Scheduled Meds:allopurinoL (ZYLOPRIM) tablet 100 mg, 100 mg, Oral, QHS  amiodarone (CORDARONE) tablet 200 mg, 200 mg, Oral, QDAY  apixaban (ELIQUIS) tablet 2.5 mg, 2.5 mg, Oral, BID  aspirin EC tablet 81 mg, 81 mg, Oral, QDAY  furosemide (LASIX) tablet 40 mg, 40 mg, Oral, QDAY  levothyroxine (SYNTHROID) tablet 50 mcg, 50 mcg, Oral, QDAY 30 min before breakfast  metoprolol XL (TOPROL XL) tablet 25 mg, 25 mg, Oral, QDAY potassium chloride SR (K-DUR) tablet 10 mEq, 10 mEq, Oral, BID w/meals    Continuous Infusions:  PRN and Respiratory Meds:      Objective:                            Vital Signs: Last Filed                 Vital  Signs: 24 Hour Range   BP: 115/62 (09/03 0457)  Temp: 36.3 ???C (97.4 ???F) (09/03 0457)  Pulse: 60 (09/03 0457)  Respirations: 18 PER MINUTE (09/03 0457)  SpO2: 93 % (09/03 0457) BP: (101-115)/(57-77)   Temp:  [36.3 ???C (97.4 ???F)-36.5 ???C (97.7 ???F)]   Pulse:  [60-63]   Respirations:  [18 PER MINUTE]   SpO2:  [93 %-97 %]      Vitals:    09/28/18 0449 09/29/18 0357 10/01/18 0457   Weight: 80.2 kg (176 lb 12.9 oz) 77.8 kg (171 lb 8.3 oz) 77.7 kg (171 lb 4.8 oz)         Intake/Output Summary:  (Last 24 hours)    Intake/Output Summary (Last 24 hours) at 10/01/2018 1125  Last data filed at 10/01/2018 0800  Gross per 24 hour   Intake 0 ml   Output 0 ml   Net 0 ml      Stool Occurrence: 0    Physical Exam    General: Patient seems to be clinically stable and in no immediate distress. AAO x3   HEENT: NC/AT  Heart: S1 S2 present, RRR, no audible rubs or murmurs heard  Lungs: Bilateral air entry noted, normal breath sounds    Abdomen: Soft, non tender and no masses noted. Bowel sounds were present.   Extremities: no pedal edema; no calf tenderness  Skin: No obvious skin rashes   Neurologic: Seems oriented. No new obvious abnormality of cranial nerves, motor system noted      Labs reviwed, Notable for:        Recent Labs     09/29/18  0550 09/30/18  0756 10/01/18  0500   NA 133* 133* 132*   K 3.7 3.8 4.9   CL 95* 95* 95*   CO2 25 27 26    GAP 13* 11 11   BUN 25 30* 33*   CR 1.41* 1.45* 1.64*   GLU 91 107* 82   CA 8.0* 8.0* 7.8*   ALBUMIN 3.1* 3.3* 3.0*   MG 1.8 2.2 2.3       Recent Labs     09/29/18  0550 09/30/18  0756 10/01/18  0500   WBC 4.5 5.2 5.1   HGB 17.8* 17.6* 17.3*   HCT 53.0* 53.5* 51.9*   PLTCT 137* 145* 143*   AST 30 25 31    ALT 34 28 27   ALKPHOS 120* 129* 104 Estimated Creatinine Clearance: 38.8 mL/min (A) (based on SCr of 1.64 mg/dL (H)).  Vitals:    09/28/18 0449 09/29/18 0357 10/01/18 0457   Weight: 80.2 kg (176 lb 12.9 oz) 77.8 kg (171 lb 8.3 oz) 77.7 kg (171 lb 4.8 oz)         Resulted Micro Last 72 Hrs    No results found

## 2018-10-01 NOTE — Case Management (ED)
Case Management Progress Note    NAME:Logan Bright                          MRN: [id number]              DOB:11/30/1935          AGE: 83 y.o.  ADMISSION DATE: 09/27/2018             DAYS ADMITTED: LOS: 3 days      Today???s Date: 10/01/2018    Plan  Discharge to [facility] today at 2pm via facility transport    Interventions  Pt wanting to go to SNF at d/c.  Provided choice list with quality data from SNF Compare and offered to answer questions.   Patient selected the following: [facility].  Referral sent and pt accepted.  Transport set up for 2pm.  Retail banker and attending notified.  Tasked CMA to complete transfer packet.  Faxed orders to facility.    ? Support      ? Info or Referral      ? Discharge Planning   Discharge Planning: Skilled Nursing Facility  ? Medication Needs                                                 ? Financial      ? Legal      ? Other        Disposition  ? Expected Discharge Date    Expected Discharge Date: 10/01/18  Expected Discharge Time: 1400  ? Transportation   Does the patient need discharge transport arranged?: No  Transportation Name, Phone and Availability #1: [facility] [phone]  Does the patient use Medicaid Transportation?: No  ? Next Level of Care (Acute Psych discharges only)      ? Discharge Disposition                        Logan Bright, LMSW *[phone]                  Durable Medical Equipment      No service has been selected for the patient.      [facility] - Selection Complete      Service Provider Request Status Selected Services Address Phone Number Fax Number Patient Preferred    [facility] [address] [place] [address] [place] [address] [phone] ???      [facility]      Service Provider Request Status Selected Services Address Phone Number Fax Number Patient Preferred    [facility] [place] Selected Home Health Services [address], [place] [address] [phone] ???      [facility]      No service has been selected for the patient.

## 2018-10-01 NOTE — Discharge Instructions - Pharmacy
Physician Discharge Summary      Name: Logan Bright  Medical Record Number: [id number]        Account Number:  [id number]  Date Of Birth:  Nov 26, 1935                         Age:  83 years   Admit date:  09/27/2018                     Discharge date:  10/01/2018    Attending Physician: Lulu Riding, MD              Service: Med Private [phone]    Physician Summary completed by: Delanna Blacketer Jake Shark, MD     Reason for hospitalization:  near syncope     Significant PMH:   Medical History:   Diagnosis Date   ??? Arthritis    ??? CAD (coronary artery disease)    ??? Dyslipidemia    ??? Fracture     RT ANKLE   ??? History of ventricular tachycardia    ??? HTN (hypertension)    ??? ICD (implantable cardiac defibrillator) in place    ??? Ischemic cardiomyopathy    ??? Neurologic cardiac syncope    ??? Renal insufficiency     mild   ??? S/P coronary artery stent placement        Allergies: Chlorhexidine gluconate and Colchicine    Admission Physical Exam notable for:    General Appearance: Cooperative, NAD  Head: NC/AT  Neck: JVP around 6-7  Eyes: Conjunctivae/corneas clear. PERRL, EOMs intact.   Lungs: Rales/rhonchi in bases  Heart: RRR, no gallop  Abdomen: Soft, NT, +BS  Extremities: Atraumatic, no edema  Neurologic: A, OX3, CNII - XII grossly intact; no gross focal deficits    Admission Lab/Radiology studies notable for:    09/27/18  2349   NA 134*   K 4.3   CL 96*   CO2 29   GAP 9   BUN 26*   CR 1.44*   GLU 95   CA 8.4*   ALBUMIN 3.5   MG 2.0   PO4 2.5   TSH 3.00     Brief Hospital Course:  The patient was admitted and the following issues were addressed during this hospitalization: (with pertinent details).       This is an 83 years old Caucasian male with a past medical history of paroxysmal atrial fibrillation on anticoagulation, neurocardiogenic syncope, HTN, HLD, CKD, gout, chronic combined CHF status post ICD, history of VT status post VT ablation, CAD status post stent placement who presented to the ER today with near syncope     Erythrocytosis  -Could be related to sleep apnea?  Interestingly his hemoglobin has been always between 7 and 8 till 2015 most likely due to CKD.  - blood smear: ERYTHROCYTOSIS PRESENT, WHICH CAN BE CAUSED BY DEHYDRATION, CHRONIC HYPOXIA,   HIGH AFFINITY HEMOGLOBINS, PARANEOPLASTIC SYNDROMES, AND POLYCYTHEMIA VERA. ABSOLUTE LYMPHOCYTOPENIA. MILD THROMBOCYTOPENIA WITH NORMAL PLATELET MORPHOLOGY   -Erythropoietin 9.   - f/u Jak2.   -  need hematology follow up.       Elevated bilirubin ( direct and indirect), asymptomatic  -Most likely due to congestive hepatopathy related to advanced end-stage heart failure  -Hepatitis panel negative  -Haptoglobin elevated.  Blood smear as above  -LDH elevation could be related to COVID-19 infection  -  liver ultrasound. Within acceptable limits. CT chest showed:  Reflux of contrast in the upper IVC and hepatic veins   is also consistent with CHF/elevated right heart pressures.  This fit diagnosis of congestive hepatopathy  - LFT in 1 week.      Fall: most likely MSK etiology ( deconditioning and malaise) rather than syncope: resolved.   Hx of neurocardiogenic syncope  - VS in the ED are wnl  - Labs within his baseline  - AOx3 in the ED, No focal neurological deficits on exam, no infectious symptomatology/SIRS on admission  >  UA clean on admisison   > PT/OT.  Discussed with Occupational Therapy on September 2  > Device interrogation: No arrhythmias/high rate episodes detected since last interrogation.Device appears to be currently functioning as programmed  -Avoid tight control of blood pressure.  Coreg was switched to metoprolol     Acute ( resolved, treated) on chronic combined CHF s/p CRT-d (upgarded from ICD 08/2018)   - Most recent ECHO with EF 20%, grade 2 DD, elevated filling pressures without a significant valvular abnormalities  - Denies any DOE/LE edema but does have new onset cough - His weight has drooped compared to a month ago although its up 9 pounds since he was last seen 08/27. His CXR is slightly worse/CT chest showed interstitial edema  > Lasix 40 mg PO.   > Consulted CHF  -Avoid tight control of blood pressure.   > Consider palliative care medicine   -Replace potassium and magnesium  -  On sept 2, Was able to walk for 6 minutes continuously without desaturation in Spo2 or fatigue. Pt requires no oxygen with exercise     PAF  Hx of VT  > Contineu APixaban, device interrogation done as above  > Continue Amiodarone 200 mg QD. Will clarify with Cardio     CAD s/p PCI  > Contineu ASA     Hypothyroidism   > Continue Synthroid     CKD stage IIIa-b  - Cr at baseline     D-dimer elevation-most likely due to COVID-19  -CT with contrast negative for PE on admission  -Lower extremity Doppler negative for DVT on September 1    Condition at Discharge: Stable    Discharge Diagnoses:       Hospital Problems        Active Problems    * (Principal) Acute on chronic combined systolic (congestive) and diastolic (congestive) heart failure [facility])          Surgical Procedures: None    Significant Diagnostic Studies and Procedures:   Cta Chest Wo/w Contrast+post Impression    Result Date: 09/28/2018    1.  No definite acute pulmonary embolus identified, though evaluation is mildly limited due to bolus timing as described. 2.  Dilatation of the main pulmonary artery, which can be seen in setting of pulmonary hypertension. 3.  Mild cardiomegaly, bilateral pleural effusions, and mild interstitial pulmonary edema, with combination of findings most consistent with CHF/fluid overload. Reflux of contrast in the upper IVC and hepatic veins is also consistent with CHF/elevated right heart pressures. 4.  Additional mild patchy groundglass and interstitial opacities which likely reflects additional atelectasis and/or pulmonary edema rather than pneumonia. 5.  Mild ectasia of ascending thoracic aorta measuring up to 4.4 cm.    Chest Single View    Result Date: 09/27/2018    Mild cardiomegaly with development of pulmonary venous congestion and edema consistent with CHF/volume overload.     Chest 2 Views    Result Date: 09/10/2018  Interval revision of pacemaker defibrillator with the addition of a coronary sinus lead and no evidence of postprocedural pneumothorax. Generalized cardiomegaly with slight improvement in vascular congestion and interstitial edema. Bilateral pleural effusions associated with zones of atelectasis and consolidation in the lower lobes, right greater than left.      US Abdomen Limited    Result Date: 09/30/2018    Cholelithiasis No evidence of bile duct dilatation Mild bladder trabeculation compatible with neurogenic bladder or bladder outlet stenosis    US Doppler Venous Bilateral    Result Date: 09/29/2018    No evidence of femoral popliteal DVT in the bilateral lower extremities.          Consults:  Cardiology    Patient Disposition: Skilled Nursing Facility       Patient instructions/medications:       CBC and Diff   Standing Status: Future Standing Exp. Date: 10/30/18    Please fax results to PCP Vanice Sarah .   (Do NOT send results to myself: Lillybeth Tal Alahmad).     Which provider would you like to CC? Vanice Sarah [[id number]]      Comprehensive Metabolic Panel (CMP)   Standing Status: Future Standing Exp. Date: 10/30/18    Please fax results to PCP Vanice Sarah .   (Do NOT send results to myself: Mone Commisso Alahmad).     Which provider would you like to CC? Danella Maiers D [[id number]]      AMB REFERRAL TO HEMATOLOGY   Referral Priority: Routine Referral Type: Consult, Test & Treat   Referral Reason: Specialty Services Required   Number of Visits Requested: 1 Expiration Date: 10/01/19     Cardiac Diet    Limiting unhealthy fats and cholesterol is the most important step you can take in reducing your risk for cardiovascular disease.  Unhealthy fats include saturated and trans fats.  Monitor your sodium and cholesterol intake.  Restrict your sodium to 2g (grams) or 2000mg  (milligrams) daily, and your cholesterol to 200mg  daily.    If you have questions regarding your diet at home, you may contact a dietitian at [phone].     Report These Signs and Symptoms    Please contact your doctor if you have any of the following symptoms: temperature higher than 100.4 degrees F, uncontrolled pain, persistent nausea and/or vomiting, difficulty breathing, chest pain, severe abdominal pain, headache, unable to urinate, unable to have bowel movement or drainage with a foul odor     Questions About Your Stay    If you have an emergency after discharge, please dial 9-1-1.    You may contact your discharging physician up to 7 days after discharge for questions about your hospitalization, discharge instructions, or medications by calling [phone] during regular business hours (8AM-4PM) and asking to speak to the doctor listed on the discharge information.       If you are not calling during business hours, ask for the on-call doctor.    If you have new  or worsening symptoms, you may be directed to your primary care provider (PCP) for ongoing questions, an Emergency Department, or an Urgent Care Clinic for a more immediate evaluation.    For all calls or questions more than 7 days after discharge, please contact your primary care provider (PCP).    For medications after discharge: pain (opioid) medicine cannot be refilled or prescribed by calling your discharging physician.  These medications need to be filled by your primary care provider (PCP).  Regular refill requests should be directed to your primary care provider (PCP).     Discharging attending physician: Felecia Jan, [facility] [[id number]]      Activity as Tolerated    It is important to keep increasing your activity level after you leave the hospital.  Moving around can help prevent blood clots, lung infection (pneumonia) and other problems.  Gradually increasing the number of times you are up moving around will help you return to your normal activity level more quickly.  Continue to increase the number of times you are up to the chair and walking daily to return to your normal activity level. Begin to work toward your normal activity level at discharge     Heart Failure Information    You are at risk for readmission to the hospital because of fluid overload. There are steps you can take to lower your risk:  *Continue your current heart medications. Changes made have been made during your hospital stay.  *Remember to weigh yourself every morning, first thing after you urinate, and write down your weigh. Take this record to your doctor's appointments.  *Chart your symptoms - fatigue, shortness of breath, swelling, etc. Immediately report any worsening.  *Remember to consume no more than 2,000mg  (milligrams) of sodium daily. Watch out for packaged, processed, canned, and restaurant foods especially.  *If any of your symptoms worsen, or if you gain more than 2 pounds in 24 hours, or 5 pounds in a week, call your cardiologist immediately. EARLY REPORTING OF THESE CHANGES IS VERY IMPORTANT.  Condition Code     Additional Discharge Instructions    As a reminder, you have tested positive for COVID-19. Please follow the instructions below.   -----------  Use a separate room and bathroom for sick household members (if possible).   Clean hands regularly by handwashing with soap and water or using an alcohol-based hand sanitizer with at least 60% alcohol.   Provide your sick household member with clean disposable facemasks to wear at home, if available, to help prevent spreading COVID-19 to others.   Clean the sick room and bathroom, as needed, to avoid unnecessary contact with the sick person. Avoid sharing personal items like utensils, food, and drinks. You should restrict activities outside your home, except for getting medical care. Do not go to work, school, or public areas. Avoid using public transportation, ride-sharing, or taxis.  Do not handle pets or other animals while sick.  Avoid sharing personal household items. You should not share dishes, drinking glasses, cups, eating utensils, towels, or bedding with other people or pets in your home. After using these items, they should be washed thoroughly with soap and water.  Clean all high-touch surfaces every day high touch surfaces include counters, tabletops, doorknobs, bathroom fixtures, toilets, phones, keyboards, tablets, and bedside tables. Also, clean any surfaces that may have blood, stool, or body fluids on them  Cover your coughs and sneezes  Cover your mouth and nose with a tissue when you cough or sneeze  Throw used tissues in a lined trash can; immediately wash your hands with soap and water for at least 20 seconds or clean your hands with an alcohol-based hand sanitizer that contains at least 60-95% alcohol, covering all surfaces of your hands and rubbing them together until they feel dry. Soap and water should be used preferentially if hands are visibly dirty    -------    For up to date information on the COVID-19 virus, visit the [employer]. [employer]  General supportive care during cold and flu season and infection prevention reminders:   Wash hands often with soap and water for at least 20 seconds  Cover your mouth and nose  Social distancing: try to maintain 6 feet between you and other people  Stay home if sick and symptoms mild or manageable  If you must be around people wear a mask     Appointment Request: Cardiology Heart Failure    TeleHealth Visit in comments due to patient is COVID 19 positive.     Was decompensated heart failure the primary reason for hospitalization? Yes    Because you selected ???Yes??? a post hospital follow up is required 3-7 days following discharge. I Acknowledge    If an appointment in the HF clinic is unavailable within 7days of discharge, please select permissible alternate departments to conduct heart failure related post hospital follow up: HF Specialist Only (will be scheduled outside of 7 day window, if unavailable)    If an appointment in the HF clinic is unavailable within 7days of discharge, please select permissible alternate departments to conduct heart failure related post hospital follow up: General Cardiologist    Expected date of discharge: Sept 2nd, 2020    Ordering Provider's or Responsible Teams's Pager #? [phone]       Current Discharge Medication List       START taking these medications    Details   metoprolol XL (TOPROL XL) 25 mg extended release tablet Take one tablet by mouth daily.  Qty: 30 tablet, Refills: 1    PRESCRIPTION TYPE:  Normal          CONTINUE these medications which have been CHANGED or REFILLED    Details   amiodarone (CORDARONE) 200 mg tablet Take one tablet by mouth daily. Take with food.  Qty: 60 tablet, Refills: 1    PRESCRIPTION TYPE:  No Print  Comments: **Patient requests 90 days supply**      potassium chloride (KLOR-CON) 10 mEq tablet Take one tablet by mouth daily. Take with a meal and a full glass of water.  Qty: 30 tablet, Refills: 0    PRESCRIPTION TYPE:  No Print          CONTINUE these medications which have NOT CHANGED    Details   allopurinol (ZYLOPRIM) 100 mg tablet TAKE 1 TABLET BY MOUTH DAILY AT BEDTIME  Qty: 90 tablet, Refills: 3    PRESCRIPTION TYPE:  Normal      apixaban (ELIQUIS) 2.5 mg tablet Take one tablet by mouth twice daily.  Qty: 60 tablet, Refills: 1    PRESCRIPTION TYPE:  Normal      aspirin EC 81 mg tablet Take 81 mg by mouth daily. Take with food.    PRESCRIPTION TYPE:  Historical Med      furosemide (LASIX) 40 mg tablet Take one tablet by mouth daily.  Qty: 90 tablet, Refills: 3    PRESCRIPTION TYPE:  Normal levothyroxine (SYNTHROID) 50 mcg tablet Take one tablet by mouth daily 30 minutes before breakfast.  Qty: 90 tablet, Refills: 3    PRESCRIPTION TYPE:  Normal          The following medications were removed from your list. This list includes medications discontinued this stay and those removed from your prior med list in our system        carvediloL (COREG) 6.25 mg tablet            Scheduled appointments:    Oct 06, 2018  4:00 PM CDT  Telephone PT New with Sue Lush, APRN-NP  Cardiology: [facility] [address]  [id number]  [place] [address]  [phone]    Additional appointment instructions:      It is crucial to have follow up with your primary care provider (PCP) in 1 week of discharge to monitor clinical course, review hospital course, and decide about the clinical utility of repeating labs, and ordering imaging. We recommend at least consideration of repeating CBC, and CMP in 1 week on discharge. Please discuss with PCP.                      Pending items needing follow up: Jak2 test.     Signed:  Tymar Polyak Jake Shark, MD  10/01/2018      cc:  Primary Care Physician:  Vanice Sarah   Verified  Referring physicians:  No ref. provider found   Additional provider(s):

## 2018-10-02 ENCOUNTER — Ambulatory Visit: Admit: 2018-10-02 | Discharge: 2018-10-02 | Primary: Cardiovascular Disease

## 2018-10-02 ENCOUNTER — Encounter: Admit: 2018-10-02 | Discharge: 2018-10-02 | Primary: Cardiovascular Disease

## 2018-10-02 NOTE — Telephone Encounter
Patient Discharge Date from hospital: 10/01/18  Date Call Attempted: 10/02/18  Number of Attempts: 2  Date Call Completed:      Second call placed to pt for post hospital call. Call was cut off X 3.    Next Appointment    Next Follow-up Appointment on  10/06/18 at 1600 with Sandy Salaam, APRN.    [facility] Ph) [phone] Fax) [phone]    Medications          Diet    200 mg cholesterol, 2 G Na, 1.5 L fluid restriction      Scale/Weight        Signs and Symptoms    Pt reports the following symptoms:       Pt verbalized understanding of signs and symptoms of HF and when to contact a provider or seek immediate assistance at the ER.    Intervention(s)          Plan of Care    Continued education needed for heart failure symptom management and when to contact our office.

## 2018-10-02 NOTE — Telephone Encounter
Patient Discharge Date from hospital: 10/01/18  Date Call Attempted: 10/02/18  Number of Attempts: 1  Date Call Completed:      Call placed to pt and LM on unauthorized VM to cb.    Next Appointment    Next Follow-up Appointment on 10/06/18 at 1600 with Sandy Salaam, [facility] Ph) [phone] Fax) [phone]    Medications          Diet    200 mg cholesterol, 2 G Na, 1.5 L fluid restriction      Scale/Weight        Signs and Symptoms        Intervention(s)          Plan of Care    Continued education needed for heart failure symptom management and when to contact our office.

## 2018-10-06 ENCOUNTER — Ambulatory Visit: Admit: 2018-10-06 | Discharge: 2018-10-06 | Primary: Cardiovascular Disease

## 2018-10-06 ENCOUNTER — Encounter: Admit: 2018-10-06 | Discharge: 2018-10-06 | Primary: Cardiovascular Disease

## 2018-10-06 NOTE — Telephone Encounter
Called patient's phone number in Chart [phone] and got no answer with a message stating "number has been discounted". Called number in chart note and also got no answer with message stating "Number has been discounted and you can not leave a voicemail"

## 2018-10-06 NOTE — Telephone Encounter
Patient Discharge Date from hospital: 10/01/18  Date Call Attempted: 10/06/18  Number of Attempts: 3  Date Call Completed:      Third call placed to pt for post hospital call and to confirm OV today at 1600. First call was disconnected. No answer to second call.    Next Appointment    Next Follow-up Appointment 10/06/18 at 1600 with Sandy Salaam, APRN.     [facility] Ph) [phone] Fax) [phone]    Medications        Diet    200 mg cholesterol, 2 G Na, 1.5 L fluid restriction    Scale/Weight        Signs and Symptoms        Intervention(s)          Plan of Care    Continued education needed for heart failure symptom management and when to contact our office.

## 2018-10-07 ENCOUNTER — Encounter: Admit: 2018-10-07 | Discharge: 2018-10-07 | Primary: Cardiovascular Disease

## 2018-10-07 NOTE — Progress Notes
Amiodarone Monitoring status as of 10/07/18:     Amiodarone monitoring complete.  Next amiodarone review is due in 180 days.    Most recent lab results  Lab Results   Component Value Date/Time    AST 31 10/01/2018 05:00 AM    ALT 27 10/01/2018 05:00 AM    TSH 3.00 09/27/2018 11:49 PM    FREET4R 1.9 (H) 09/04/2018 09:51 AM         Procedures  Last chest X-Ray: 09/27/2018   Last PFT: 04/01/13  Last eye exam:

## 2018-10-08 LAB — JAK2 BLOOD V617F VARIANT REFLEX

## 2018-10-08 LAB — MPL MYELO LEUKEMIA REFLEX JAK2

## 2018-10-08 LAB — CALRETICULIN CALR REFLEX JAK2

## 2018-10-12 ENCOUNTER — Encounter: Admit: 2018-10-12 | Discharge: 2018-10-12 | Payer: MEDICARE | Primary: Cardiovascular Disease

## 2018-10-12 MED ORDER — ALLOPURINOL 100 MG PO TAB
100 mg | ORAL_TABLET | Freq: Every evening | ORAL | 3 refills | 60.00000 days | Status: AC
Start: 2018-10-12 — End: ?

## 2018-10-13 ENCOUNTER — Ambulatory Visit: Admit: 2018-10-13 | Discharge: 2018-10-13 | Payer: MEDICARE | Primary: Cardiovascular Disease

## 2018-10-19 ENCOUNTER — Encounter: Admit: 2018-10-19 | Discharge: 2018-10-19 | Payer: MEDICARE | Primary: Cardiovascular Disease

## 2018-10-19 MED ORDER — AMIODARONE 200 MG PO TAB
200 mg | ORAL_TABLET | Freq: Every day | ORAL | 3 refills | 42.00000 days | Status: AC
Start: 2018-10-19 — End: ?

## 2018-10-20 ENCOUNTER — Ambulatory Visit: Admit: 2018-10-20 | Discharge: 2018-10-20 | Payer: MEDICARE | Primary: Cardiovascular Disease

## 2018-10-21 ENCOUNTER — Ambulatory Visit: Admit: 2018-10-21 | Discharge: 2018-10-22 | Payer: MEDICARE | Primary: Cardiovascular Disease

## 2018-10-21 ENCOUNTER — Encounter: Admit: 2018-10-21 | Discharge: 2018-10-21 | Payer: MEDICARE | Primary: Cardiovascular Disease

## 2018-10-21 LAB — BASIC METABOLIC PANEL
Lab: 1.6 mg/dL — ABNORMAL HIGH (ref 0.4–1.24)
Lab: 113 mg/dL — ABNORMAL HIGH (ref 70–100)
Lab: 133 MMOL/L — ABNORMAL LOW (ref 137–147)
Lab: 26 MMOL/L (ref 21–30)
Lab: 4.7 MMOL/L (ref 3.5–5.1)
Lab: 40 mL/min — ABNORMAL LOW (ref >60)
Lab: 44 mg/dL — ABNORMAL HIGH (ref 7–25)
Lab: 49 mL/min — ABNORMAL LOW (ref >60)
Lab: 8.6 mg/dL (ref 8.5–10.6)
Lab: 99 MMOL/L (ref 98–110)
Lab: 8 (ref 3–12)

## 2018-10-21 NOTE — Progress Notes
Date of Service: 10/21/2018    Logan Bright is a 83 y.o. male.   He is followed by Dr. Barry Dienes.    HPI     His past medical history is significant for ischemic cardiomyopathy, chronic combined systolic and diastolic heart failure, coronary artery disease s/p PCI, history of VT s/p VT ablation, s/p ICD, paroxysmal atrial fibrillation on anticoagulation, and neurocardiogenic syncope. He was hospitalized 08/2018 for heart failure exacerbation.    He was admitted to the hospital 8/30-10/01/2018 for near syncopal episode.  His ICD was interrogated with no evidence of arrhythmia to cause the event. His carvedilol was switched to Toprol-XL. He tested positive for COVID-19 on 8/31 (he reports he was asymptomatic). He was discharged to [facility].    He is here today for post hospital follow-up.  He reports that he is doing well.  He is undergoing physical therapy without any issues and hopes to be discharged home soon.  He is using 2 pillows at night for comfort to sleep.  He denies orthopnea, paroxysmal nocturnal dyspnea, early satiety, abdominal distention, lower extremity edema, chest pain, irregular heartbeat/palpitations, dizziness, and/or syncope/near syncope.         Vitals:    10/21/18 1003   BP: 108/60   BP Source: Arm, Right Upper   Pulse: 61   SpO2: 94%   Weight: 78.7 kg (173 lb 9.6 oz)   Height: 1.829 m (6')   PainSc: Zero     Body mass index is 23.54 kg/m?[name]     Past Medical History  Patient Active Problem List    Diagnosis Date Noted   ? Acute on chronic systolic congestive heart failure (HCC) 09/04/2018   ? Acute on chronic systolic heart failure (HCC) 09/04/2018   ? Hyponatremia 09/04/2018   ? Hypothyroidism 09/04/2018   ? Paroxysmal atrial fibrillation (HCC) 04/07/2018   ? Cryptogenic stroke [facility]) 02/06/2016     2017 - Eye doctor told him he'd had an old stroke after doing an eye exam    08/06/16 Carotid Ultrasound [facility]): Bilateral antegrade vertebral artery flow. No hemodynamically significant common or internal carotid artery stenosis.     ? Closed trimalleolar fracture of right ankle 11/12/2015   ? Debility 06/10/2013   ? Recurrent ventricular tachycardia w/ 2 VT ablations 04/2013 05/11/2013     Sustained VT w/ multiple ICD tachypacing and Shocks, failed amiodaron   05/05/13 & 05/12/13  VT ablations [facility] w/ impella support 4/15, Home off Coreg and losartan d/t post procedure hematoma, hypotension     ? 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 Ermelinda Das.        ? Ischemic cardiomyopathy      6/02 - infarct 2 stents .  [facility]            4/04 - Surveillance stress test at [facility]: reportedly negative.  4/05 - Stress thallium study: EF 30%, LAD and RCA defects, elevated lung uptake.- Echo doppler: EF 25%, amt& inf hypokinesis,  apical akinesis, poss  thrombus, mild valvular Regurg       5/06 - Stress thallium: EF 22%, no signficant interval change.  08/2011 - Dobut echo.  EF 10-15%. Antero-apical akinesis/dyskinesis.  No ischemia.  04/2013 RegadenThall EF 23% Extensive Anterior infarct, no ischemia, unchanged from prior     ? Acute on chronic combined systolic (congestive)  and diastolic (congestive) heart failure (HCC)       2003 - Status post ICD implant.    4/05 - EF 25-30% per stress thallium and echo doppler.  08/2011 - Echo EF 10-15%     ? Neurologic cardiac syncope        3/05 - Developed syncope while at restaurant, taken to ED.  Symptoms of diaphoresis                    and lightheadedness.          4/05 - Head up tilt study: positive for inducing hypotension 72/49. Produced symptoms                    c/w previous syncopal episode.     ? HTN (hypertension)                 2002 - Diagnosis established.     ? Dyslipidemia 09/26/2008  03/14/2009  02/26/2011  04/17/2012  03/31/2013   LDL 107 (H) 141 (H) 80 108 (H) 104 (H)   04/2013: Refuses Statins understands that they reduce recurrent CV events, still declines.      ? Gout    ? CKD (chronic kidney disease)      mild       Review of Systems   Constitution: Negative.   HENT: Negative.    Eyes: Negative.    Cardiovascular: Negative.    Respiratory: Negative.    Endocrine: Negative.    Hematologic/Lymphatic: Negative.    Skin: Negative.    Musculoskeletal: Negative.    Gastrointestinal: Negative.    Genitourinary: Negative.    Neurological: Negative.    Psychiatric/Behavioral: Negative.    Allergic/Immunologic: Negative.    All other systems reviewed and are negative.      Physical Exam  Constitutional: Appears well-developed and nourished, no acute distress  HENT:  Head - normocephalic   Eyes: Conjunctivae normal  Neck: No JVP, no HJR  Cardiac Rhythm: Normal rate, regular rhythm  Cardiac Ausculation:  S1/S2 normal, no definitive S3 or S4, no gallop or friction rub  Murmurs: No murmur heard  Radial Arteries: Normal symmetric radial pulses   Lower Extremity Edema: Trace lower extremity edema  Pulmonary/Chest: Breathing comfortably, no respiratory distress, lungs clear to ausculation, no rales / rhonchi / wheezing  Abdominal: Soft, non-tender, no obvious masses, bowel sounds present  Musculoskeletal: Normal range of motion, walks without assistance   Skin: Warm / dry, no erythema   Neurological: Alert and oriented to person, place, and time; no focal deficits  Psychiatric: Normal mood and affect; judgment, behavior, and thought content normal  Language and Memory: patient responsive and seems to comprehend information   Vital signs reviewed      Problems Addressed Today  Encounter Diagnoses   Name Primary?   ? Chronic combined systolic and diastolic congestive heart failure (HCC) Yes   ? Ischemic cardiomyopathy    ? Paroxysmal atrial fibrillation (HCC)    ? Essential hypertension ? Chronic kidney disease, stage 3 (HCC)        Assessment and Plan     Chronic Combined Systolic and Diastolic Heart Failure with Reduced Ejection Fraction:    - Echo on 09/04/2018 showed an EF of 20%  - Today, he describes NYHA Functional Class II symptoms, appears euvolemic by exam  - GDMT: Toprol-XL 25 mg daily, furosemide 40 mg daily (losartan discontinued for hypotension)  - No medication changes were made today.  BMP checked today and is stable. He may benefit from spironolactone in the future.    Ischemic Cardiomyopathy:    - GDMT as described above    Coronary Artery Disease:  - He is not describing any anginal type symptoms, continue on aspirin     Atrial Fibrillation (Paroxysmal):  - He denies palpitations, on amiodarone and Toprol-XL  - He is on Eliquis oral anticoagulation with no significant signs / symptoms of bleeding     Hypertension:  -Today's blood pressures 108/60, he has had episodes of hypotension recently and his losartan was discontinued.  We will continue to monitor.      Chronic Kidney Disease - Stage III:  - Most recent creatinine on 10/01/2018 was 1.64 (baseline 1.4-1.6)  -Rechecked BMP today, creatinine stable at 1.65, will continue to monitor    Follow Up Visit: Dr. Barry Dienes in 1 month      I have personally documented the HPI, exam and medical decision making.  Patient education: I reviewed recent lab results and current medications, medication instructions, discussed heart failure signs & symptoms,  low  sodium diet, fluid restriction and daily weights. I have instructed the patient on the plan of care and they verbalize understanding of the plan. Please see AVS for full patient teaching. Patient advised to call our office if s/he has any problems, questions, worsening symptoms, or concerns prior to the next appointment.       Total time 40 minutes.  Estimated counseling time 25 minutes.     Thank you for allowing me to participate in the care of this patient. If you have any questions please do not hesitate to contact our office.      Justus Memory, DNP, APRN, NP-C  Collaborating Physician: Dr. Earnest Bailey  [facility]  Phone: [phone] Fax: [phone]         Current Medications (including today's revisions)  ? allopurinoL (ZYLOPRIM) 100 mg tablet Take one tablet by mouth at bedtime daily. Take with food.   ? amiodarone (CORDARONE) 200 mg tablet Take one tablet by mouth daily. Take with food.   ? apixaban (ELIQUIS) 2.5 mg tablet Take one tablet by mouth twice daily.   ? aspirin EC 81 mg tablet Take 81 mg by mouth daily. Take with food.   ? furosemide (LASIX) 40 mg tablet Take one tablet by mouth daily.   ? levothyroxine (SYNTHROID) 50 mcg tablet Take one tablet by mouth daily 30 minutes before breakfast.   ? metoprolol XL (TOPROL XL) 25 mg extended release tablet Take one tablet by mouth daily.   ? potassium chloride (KLOR-CON) 10 mEq tablet Take one tablet by mouth daily. Take with a meal and a full glass of water.

## 2018-11-05 NOTE — Telephone Encounter
-----   Message from Vickie Epley, RN sent at 11/05/2018 10:49 AM CDT -----  Regarding: do you mind calling pt to see which [place] he wants? He curerntly has two. Thanks!

## 2018-11-11 ENCOUNTER — Encounter: Admit: 2018-11-11 | Discharge: 2018-11-11 | Payer: MEDICARE | Primary: Cardiovascular Disease

## 2018-11-11 NOTE — Telephone Encounter
----- Message from Blase Mess sent at 11/11/2018  9:03 AM CDT -----  I've tried the patient again and the patient emergency contact number as well.  I don't get an answer on either.  It is now a little past 9 a.m. I am doubtful the patient can make an 11 a.m. appt.  That melissa held for today.   ----- Message -----  From: Doy Mince, RN  Sent: 11/11/2018   8:06 AM CDT  To: Marshell Garfinkel,    Efraim Kaufmann is off today. I do not see another phone number for him, other than the emergency contact.    Nicola Heinemann   ----- Message -----  From: Blase Mess  Sent: 11/11/2018   8:03 AM CDT  To: Connye Burkitt, RN    Melissa, I tried to call and I get no answer.  Do you have an alternate number for him/    ----- Message -----  From: Connye Burkitt, RN  Sent: 11/10/2018   4:41 PM CDT  To: Blase Mess    I have a held spot tomorrow at 11 AM.     Do you mind calling him Verlon Au? Thanks Verlon Au!  ----- Message -----  From: Vanice Sarah, MD  Sent: 11/10/2018   4:38 PM CDT  To: Blase Mess, Connye Burkitt, RN  Subject: FW: medication concern                           Do you mind looking to see if we could see him at [place] before 10/22?  Thanks!  ----- Message -----  From: Ninetta Lights, MD  Sent: 11/10/2018   2:23 PM CDT  To: Rocky Morel, APRN-NP, #  Subject: RE: medication concern                           Hi,   I am seeing Mr Komisar this week at [facility], and looks like he will be stayig in skilled rehab till at least 10/22.  Ques:  can he be seen on 10/22, or earlier at [place] main campus?    Let us know,  Thank you!  ----- Message -----  From: Rocky Morel, APRN-NP  Sent: 11/10/2018  10:48 AM CDT  To: Ninetta Lights, MD  Subject: FW: medication concern                             ----- Message -----  From: Allen Norris, RN  Sent: 11/02/2018  11:01 AM CDT  To: Rocky Morel, APRN-NP, #  Subject: RE: medication concern                           Yes, pt added to your schedule at 1015 on 10/22. ----- Message -----  From: Vanice Sarah, MD  Sent: 11/01/2018   9:30 AM CDT  To: Rocky Morel, APRN-NP, #  Subject: RE: medication concern                           Thanks.  [facility], we should squeeze him in to my schedule 10/22 if he's out of SNF in [facility] by then.  ----- Message -----  From: Rocky Morel, APRN-NP  Sent: 10/29/2018   8:38 PM CDT  To: Shanda Bumps  Everlean Alstrom, MD, #  Subject: medication concern                               Hello! We are seeing Mr. Amadon while he is at [facility]. He was on metoprolol, amiodarone and eliquis for paroxysmal afib. We were notified yesterday of significant hematuria for him and stopped his eliquis. He remains clinically stable but wanted to make you aware in case you either wanted to see him in office sooner than anticipated or had additional recommendations for medication management. Thank you!    Kennith Maes, APRN

## 2018-11-17 ENCOUNTER — Encounter: Admit: 2018-11-17 | Discharge: 2018-11-17 | Payer: MEDICARE | Primary: Cardiovascular Disease

## 2018-11-17 ENCOUNTER — Ambulatory Visit: Admit: 2018-11-17 | Discharge: 2018-11-17 | Payer: MEDICARE | Primary: Cardiovascular Disease

## 2018-11-17 DIAGNOSIS — I2510 Atherosclerotic heart disease of native coronary artery without angina pectoris: Secondary | ICD-10-CM

## 2018-11-17 DIAGNOSIS — I255 Ischemic cardiomyopathy: Secondary | ICD-10-CM

## 2018-11-17 DIAGNOSIS — Z8679 Personal history of other diseases of the circulatory system: Secondary | ICD-10-CM

## 2018-11-17 DIAGNOSIS — E785 Hyperlipidemia, unspecified: Secondary | ICD-10-CM

## 2018-11-17 DIAGNOSIS — I10 Essential (primary) hypertension: Secondary | ICD-10-CM

## 2018-11-17 DIAGNOSIS — I480 Paroxysmal atrial fibrillation: Secondary | ICD-10-CM

## 2018-11-17 DIAGNOSIS — Z95810 Presence of automatic (implantable) cardiac defibrillator: Secondary | ICD-10-CM

## 2018-11-17 DIAGNOSIS — M1990 Unspecified osteoarthritis, unspecified site: Secondary | ICD-10-CM

## 2018-11-17 DIAGNOSIS — Z955 Presence of coronary angioplasty implant and graft: Secondary | ICD-10-CM

## 2018-11-17 DIAGNOSIS — T148XXA Other injury of unspecified body region, initial encounter: Secondary | ICD-10-CM

## 2018-11-17 DIAGNOSIS — I5043 Acute on chronic combined systolic (congestive) and diastolic (congestive) heart failure: Secondary | ICD-10-CM

## 2018-11-17 DIAGNOSIS — R55 Syncope and collapse: Secondary | ICD-10-CM

## 2018-11-17 DIAGNOSIS — N289 Disorder of kidney and ureter, unspecified: Secondary | ICD-10-CM

## 2018-11-17 NOTE — Progress Notes
Date of Service: 11/17/2018    Olvin Rohr Damain Broadus is a 83 y.o. male.       HPI     I wanted to send you an update on your patient, Makel Mcmann, who I saw for followup in the Cardiovascular Medicine clinic at [facility].     [name] is a 83 yr old who is followed by Dr Barry Dienes with ischemic cardiomyopathy, chronic combined systolic and diastolic heart failure, coronary artery disease s/p PCI,  VT s/p VT ablation in 2015, s/p dual chamber ICD with upgrade to CRT-D on 09/09/18,  CKD stage III, hypertension, dyslipidemia, PAF on rhythm control strategy with amiodarone and anticoagulation, and neurocardiogenic syncope.     He was hospitalized in early August for exacerbation of HFrEF.  His echo doppler on 09/04/18 showed EF of 20%, grade II diastolic dysfunctin, severe right atrial dilation with severe reduce function, elevated RAP of 18 mmhg and PAP of 46 mmHg.  Compared to his previous echo in 2015, his LV function was stable.  His dual chamber ICD was upgraded to CRT-D on 09/09/18.   ?  He was hospitalized from 8/30-10/01/2018 for near syncopal episode.  His ICD was interrogated with no evidence of arrhythmia to cause the event. Device function was normal, stable leads thresholds. V paced 100%. His carvedilol was switched to Toprol-XL. He tested positive for COVID-19 on 8/31 (he reports he was asymptomatic). He was discharged to [facility].     He was seen in follow up on 10/21/18 by Davina Poke NP in the AHF clinic and stable without overt failure.  His ARB was previously discontinued due to hypotension but remained on Topro XL 25 mg, furosemide 40 mg daily. ?benefit from spiro     [name] remains at [facility] skilled nursing facility for conditioning and physical therapy.  He denies exertional shortness of breath, orthopnea, PND or peripheral edema.  He denies any bleeding issues on low-dose Eliquis.  He states that he has had some occasional low blood pressures during physical therapy but denies any presyncope or syncopal episode.  Unfortunately I do not have a blood pressure readings from his rehab facility.  ?         Vitals:    11/17/18 1102 11/17/18 1119   BP: 126/72 118/64   BP Source: Arm, Left Upper    Pulse: 76    SpO2: 95%    Weight: 78.7 kg (173 lb 8 oz)    Height: 1.829 m (6')    PainSc: Zero      Body mass index is 23.53 kg/m?[name]     Past Medical History  Patient Active Problem List    Diagnosis Date Noted   ? Hyponatremia 09/04/2018   ? Hypothyroidism 09/04/2018   ? Paroxysmal atrial fibrillation (HCC) 04/07/2018   ? Cryptogenic stroke [facility]) 02/06/2016     2017 - Eye doctor told him he'd had an old stroke after doing an eye exam    08/06/16 Carotid Ultrasound [facility]): Bilateral antegrade vertebral artery flow. No hemodynamically significant common or internal carotid artery stenosis.     ? Closed trimalleolar fracture of right ankle 11/12/2015   ? Debility 06/10/2013   ? Recurrent ventricular tachycardia w/ 2 VT ablations 04/2013 05/11/2013     Sustained VT w/ multiple ICD tachypacing and Shocks, failed amiodaron   05/05/13 & 05/12/13  VT ablations [facility] w/ impella support 4/15, Home off Coreg and losartan d/t post procedure hematoma, hypotension     ?  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 Ermelinda Das.        ? Ischemic cardiomyopathy      6/02 - infarct 2 stents .  [facility]            4/04 - Surveillance stress test at [facility]: reportedly negative.  4/05 - Stress thallium study: EF 30%, LAD and RCA defects, elevated lung uptake.- Echo doppler: EF 25%, amt& inf hypokinesis,  apical akinesis, poss  thrombus, mild valvular Regurg       5/06 - Stress thallium: EF 22%, no signficant interval change.  08/2011 - Dobut echo.  EF 10-15%. Antero-apical akinesis/dyskinesis.  No ischemia.  04/2013 RegadenThall EF 23% Extensive Anterior infarct, no ischemia, unchanged from prior     ? Acute on chronic combined systolic (congestive) and diastolic (congestive) heart failure (HCC)       2003 - Status post ICD implant.    4/05 - EF 25-30% per stress thallium and echo doppler.  08/2011 - Echo EF 10-15%     ? Neurologic cardiac syncope        3/05 - Developed syncope while at restaurant, taken to ED.  Symptoms of diaphoresis                    and lightheadedness.          4/05 - Head up tilt study: positive for inducing hypotension 72/49. Produced symptoms                    c/w previous syncopal episode.     ? HTN (hypertension)                 2002 - Diagnosis established.     ? Dyslipidemia         09/26/2008  03/14/2009  02/26/2011  04/17/2012  03/31/2013   LDL 107 (H) 141 (H) 80 108 (H) 104 (H)   04/2013: Refuses Statins understands that they reduce recurrent CV events, still declines.      ? Gout    ? CKD (chronic kidney disease)      mild           Review of Systems   Constitution: Negative.   HENT: Negative.    Eyes: Negative.    Cardiovascular: Negative.    Respiratory: Negative.    Endocrine: Negative.    Skin: Negative.    Musculoskeletal: Negative.    Gastrointestinal: Negative.    Genitourinary: Negative.    Neurological: Negative.    Psychiatric/Behavioral: Negative.    Allergic/Immunologic: Negative.        Physical Exam   General Appearance: no acute distress  Skin: warm & intact  HEENT: unremarkable  Neck Veins: neck veins are flat & not distended  Carotid Arteries: no bruits  Chest Inspection: chest is normal in appearance  Auscultation/Percussion: lungs clear to auscultation, no rales, rhonchi, or wheezing  Cardiac Rhythm: regular rhythm & normal rate  Cardiac Auscultation: Normal S1 & S2, no S3 or S4, no rub  Murmurs: no cardiac murmurs   Extremities: trace lower extremity edema to left leg, no LE to Rt leg; 2+ symmetric distal pulses Abdominal Exam: soft, non-tender, no masses, bowel sounds normal  Liver & Spleen: no organomegaly  Neurologic Exam: oriented to time, place and person; no focal neurologic deficits  Psychiatric:  Normal mood and affect.  Behavior is normal. Judgment and thought content normal.       Problems Addressed Today  Encounter Diagnoses   Name Primary?   ? Essential hypertension Yes   ? Dyslipidemia    ? Paroxysmal atrial fibrillation (HCC)    ? Ischemic cardiomyopathy    ? Acute on chronic combined systolic (congestive) and diastolic (congestive) heart failure (HCC)        Assessment and Plan     1.  Chronic combined systolic and diastolic heart failure, EF of 16% . NYHA class II, Stage C.  [name] remains doing well.  He is euvolemic on today's exam.  His weight is stable from his visit last month in the advanced heart failure clinic.  He is on goal-directed medical therapy including Toprol-XL 25 mg and Lasix 40 mg daily.  Due to the soft blood pressures his ARB has been discontinued.  Due to the worsening renal function he was not placed on spironolactone.  I made no medication since he remains asymptomatic.    2.  ICD with a CRT-D upgrade on 09/09/2018.  Recent device interrogation with his hospitalization the end of August showed stable function, no firing from his ICD and stable volume status and lead thresholds.    3.  Paroxysmal atrial fib maintaining rhythm control strategy on amiodarone.   He remains on low-dose Eliquis for CHA2DS2-Vasc 5.  He is on Eliquis 2.5 mg twice a day due to age greater than 80 and creatinine greater than 1.5.      4.  CKD stage III.   His creatinine on 10/21/2018 showed slight increase in his creatinine from 1.65 compared to his discharge of 1. 64 on 10/01/18.  his baseline creatinine ranges from 1.4-1.5.   We will repeat a BMP and BNP to assess renal function and potassium levels.     5. Hypertension.   His blood pressure was 126/72 with a repeat of 118/64.   I was apprehensive on increasing his beta-blocker due to endorsing low blood pressures during physical therapy, however I do not have readings to substantiate his concerns.      Since he remains asymptomatic I made no optimization in his heart failure regimen.  It's been difficult to maximize HFrEF regimen with ARB and spironolactone based on soft blood pressures and CKD.     He will follow-up with Dr. Barry Dienes as previously scheduled on December 09, 2018.  We will repeat device interrogation at that time.        Thank you for allowing me to participate in Mr. Aschenbrenner care.  Please feel free to contact me if I can be of further assistance.    Roberts Gaudy PA-c            Current Medications (including today's revisions)  ? allopurinoL (ZYLOPRIM) 100 mg tablet Take one tablet by mouth at bedtime daily. Take with food.   ? amiodarone (CORDARONE) 200 mg tablet Take one tablet by mouth daily. Take with food.   ? apixaban (ELIQUIS) 2.5 mg tablet Take one tablet by mouth twice daily.   ? aspirin EC 81 mg tablet Take 81 mg by mouth daily. Take with food.   ? furosemide (LASIX) 40 mg tablet Take one tablet by mouth daily.   ? levothyroxine (SYNTHROID) 50 mcg tablet Take one tablet by mouth daily 30 minutes before breakfast.   ? metoprolol XL (TOPROL XL) 25 mg extended release tablet Take one tablet by mouth daily.   ?  potassium chloride (KLOR-CON) 10 mEq tablet Take one tablet by mouth daily. Take with a meal and a full glass of water.

## 2018-11-17 NOTE — Patient Instructions
   Please have your labs drawn, BMP/BNP, orders attached.     We will contact you with the results of your test(s) after Jeral Fruit has reviewed them.      Please contact the cardiology RED Team, Dr. Ricard Dillon team, if you have any questions or concerns. Call the nurses at [phone]  We will get back to you as soon as possible. For urgent or after hours needs, please call [phone].     NOTE: MyChart messages and phone calls received on weekends, on holidays, and after 4 pm on weekdays will NOT be seen until the following business day. If you have an urgent matter during these times, please call [phone] to reach the on-call team.     If you need prescription refills, please contact your pharmacy.

## 2018-11-18 ENCOUNTER — Encounter: Admit: 2018-11-18 | Discharge: 2018-11-18 | Payer: MEDICARE | Primary: Cardiovascular Disease

## 2018-11-20 ENCOUNTER — Encounter: Admit: 2018-11-20 | Discharge: 2018-11-20 | Payer: MEDICARE | Primary: Cardiovascular Disease

## 2018-11-20 NOTE — Progress Notes
From: Ranae Plumber   Sent: 11/20/2018  6:59 AM CDT   To: Oretha Ellis, APRN-NP, *     Bp's look good. No further changes at this time.   [facility] nurses, could you make sure these readings are posted.     Thx   Jody   ----- Message -----   From: Oretha Ellis, APRN-NP   Sent: 11/19/2018  3:33 PM CDT   To: Perry Mount, PA-C, *     Hi! You saw Mr. Arroyos yesterday for f/u on his CHF. I am currently seeing him for primary care while he is at skilled nursing at [facility]. I saw your note from yesterday and wanted to send along some additional information. My apologies that the facility did not send you up to date information.     Mr. Royes has cognitive decline and is not a good historian. While he has been with [name], he experienced significant hematuria and his eliquis has been stopped for several weeks. He has had two additional episodes of syncope, both while doing physical therapy. His weight has creeped up slowly with about a 10lb weight gain since the start of September. Labs that we ran on 10/14 showed a stable K 3.8, Mg 1.6 and creat 1.1. We have continued his lasix at 40mg  daily. His blood pressures have been as follows:     11/19/2018 11:29 118/70 mmHg (Sitting l/arm)   11/18/2018 10:43 122/66 mmHg (Sitting l/arm)   11/17/2018 10:24 110/58 mmHg (Lying r/arm) Diastolic Low of 60 exceeded   11/16/2018 10:54 110/59 mmHg (Sitting r/arm) Diastolic Low of 60 exceeded   11/15/2018 08:56 120/68 mmHg (Sitting r/arm)   11/14/2018 09:39 116/56 mmHg (Lying l/arm) Diastolic Low of 60 exceeded   11/12/2018 11:03 120/78 mmHg (Standing   r/arm)   11/11/2018 08:04 100/53 mmHg (Sitting l/arm)   11/10/2018 08:28 126/70 mmHg (Sitting l/arm)       If you would like [name] to change or optimize any medications based on the new information, please let me know and I will be happy to do so. Otherwise, we will plan on him seeing Dr. Ricard Dillon on Nov. 11th. Thank you!     Tora Duck, APRN

## 2018-12-10 ENCOUNTER — Encounter: Admit: 2018-12-10 | Discharge: 2018-12-10 | Payer: MEDICARE | Primary: Cardiovascular Disease

## 2018-12-10 DIAGNOSIS — D751 Secondary polycythemia: Secondary | ICD-10-CM

## 2018-12-11 ENCOUNTER — Encounter: Admit: 2018-12-11 | Discharge: 2018-12-11 | Payer: MEDICARE | Primary: Cardiovascular Disease

## 2018-12-11 DIAGNOSIS — I255 Ischemic cardiomyopathy: Secondary | ICD-10-CM

## 2018-12-11 DIAGNOSIS — I10 Essential (primary) hypertension: Secondary | ICD-10-CM

## 2018-12-11 DIAGNOSIS — E785 Hyperlipidemia, unspecified: Secondary | ICD-10-CM

## 2018-12-11 DIAGNOSIS — I2510 Atherosclerotic heart disease of native coronary artery without angina pectoris: Secondary | ICD-10-CM

## 2018-12-11 DIAGNOSIS — Z8679 Personal history of other diseases of the circulatory system: Secondary | ICD-10-CM

## 2018-12-11 DIAGNOSIS — Z95810 Presence of automatic (implantable) cardiac defibrillator: Secondary | ICD-10-CM

## 2018-12-11 DIAGNOSIS — N289 Disorder of kidney and ureter, unspecified: Secondary | ICD-10-CM

## 2018-12-11 DIAGNOSIS — Z955 Presence of coronary angioplasty implant and graft: Secondary | ICD-10-CM

## 2018-12-11 DIAGNOSIS — R55 Syncope and collapse: Secondary | ICD-10-CM

## 2018-12-11 DIAGNOSIS — M1990 Unspecified osteoarthritis, unspecified site: Secondary | ICD-10-CM

## 2018-12-11 DIAGNOSIS — T148XXA Other injury of unspecified body region, initial encounter: Secondary | ICD-10-CM

## 2018-12-11 DIAGNOSIS — D751 Secondary polycythemia: Secondary | ICD-10-CM

## 2018-12-11 LAB — IRON + BINDING CAPACITY + %SAT+ FERRITIN
Lab: 122 ug/dL (ref 50–185)
Lab: 355 ug/dL (ref 270–380)

## 2018-12-11 LAB — CBC AND DIFF
Lab: 17 % — ABNORMAL HIGH (ref 11–15)
Lab: 33 g/dL (ref 32.0–36.0)
Lab: 5.3 10*3/uL (ref 4.5–11.0)

## 2018-12-11 LAB — LDH-LACTATE DEHYDROGENASE: Lab: 174 U/L (ref 100–210)

## 2018-12-11 LAB — ERYTHROPOIETIN: Lab: 20 mU/mL (ref 3.7–29.5)

## 2018-12-11 NOTE — Patient Instructions
Your care team:      Kavin Leech, APRN-NP               Hematology Nurse Practitioner   Clinical Nurse Coordinator (CNC)               Nolon Nations, RN, BSN  Boone Master, RN, BSN      Mychart:   - We strongly recommend signing up for Mychart, our patient access portal, and sending [name] non-urgent questions/concerns through this portal. We will reply within one business day.   - This is also a great resource to view all of your labs and scan results.   - Please feel free to ask our scheduler upon checkout for help setting up your Mychart access.     Notify Clinic for the Following:  ? Fevers at or greater than 100.5 degrees (do not take Tylenol until you have spoken to a provider)  ? Persistent or uncontrolled vomiting  ? Persistent or uncontrolled diarrhea (more than 6 watery stools per day) or signs of blood in stools   ? Progressive cough with increased sputum (phlegm or mucus) production accompanied by increased shortness of breath  ? Recurrent nosebleeds or nosebleeds that lasts longer than 30 minutes.  ? Severe mouth sores/mouth pain  ? Severe constipation without a bowel movement in 4-5 days or significant straining.    For patients needing to establish care with a primary care provider within the [facility] network, please call [facility] Scheduling: [phone]    Phone numbers:     Direct clinic number: [phone]  Fax Number: [phone]    Please call [facility] nurse line if you have non-urgent questions or concerns during business hours.   Messages left on nurses line are checked Monday through Friday between the hours of 8:00 AM and 3:30 PM. Messages left after 3:30 pm will be returned the following business day.     Evening (after 4:00 PM), weekends, and holiday on-call: [phone]   For urgent needs after hours, please ask for the oncologist on-call to be paged. For urgent needs during business hours, please ask for Dara or Shauna, [facility] Arango's nurse coordinators, to be paged.     Notes:   - Allow 7-10 business days for our office to complete any requested paperwork (FMLA, etc.)   - Allow two to three business days for all medication refills. Please call your pharmacy first to check for available refills.

## 2018-12-14 ENCOUNTER — Encounter: Admit: 2018-12-14 | Discharge: 2018-12-14 | Payer: MEDICARE | Primary: Cardiovascular Disease

## 2018-12-14 DIAGNOSIS — I10 Essential (primary) hypertension: Secondary | ICD-10-CM

## 2018-12-14 DIAGNOSIS — I2510 Atherosclerotic heart disease of native coronary artery without angina pectoris: Secondary | ICD-10-CM

## 2018-12-14 DIAGNOSIS — Z955 Presence of coronary angioplasty implant and graft: Secondary | ICD-10-CM

## 2018-12-14 DIAGNOSIS — N289 Disorder of kidney and ureter, unspecified: Secondary | ICD-10-CM

## 2018-12-14 DIAGNOSIS — E785 Hyperlipidemia, unspecified: Secondary | ICD-10-CM

## 2018-12-14 DIAGNOSIS — Z95810 Presence of automatic (implantable) cardiac defibrillator: Secondary | ICD-10-CM

## 2018-12-14 DIAGNOSIS — Z8679 Personal history of other diseases of the circulatory system: Secondary | ICD-10-CM

## 2018-12-14 DIAGNOSIS — M1990 Unspecified osteoarthritis, unspecified site: Secondary | ICD-10-CM

## 2018-12-14 DIAGNOSIS — I255 Ischemic cardiomyopathy: Secondary | ICD-10-CM

## 2018-12-14 DIAGNOSIS — R55 Syncope and collapse: Secondary | ICD-10-CM

## 2018-12-14 DIAGNOSIS — T148XXA Other injury of unspecified body region, initial encounter: Secondary | ICD-10-CM

## 2018-12-17 ENCOUNTER — Encounter: Admit: 2018-12-17 | Discharge: 2018-12-17 | Payer: MEDICARE | Primary: Cardiovascular Disease

## 2018-12-17 NOTE — Telephone Encounter
Reviewed lab results from 12/11/18 with [name] Minor, patient's caregiver, with patient's permission.    Hemoglobin has returned to normal. EPO level and iron studies were normal.   No other cytopenias.   Continue surveillance and follow up with PCP and/or referring provider.  Please see office visit note on 12/11/18 for further plan details.    Ms. Minor agreed with plan of care and verbalized understanding. Questions and concerns were addressed to patient's satisfaction.     Lum Keas, APRN-NP  Division of Hematology and Oncology  Pager [phone]

## 2018-12-22 ENCOUNTER — Encounter: Admit: 2018-12-22 | Discharge: 2018-12-22 | Payer: MEDICARE | Primary: Cardiovascular Disease

## 2018-12-22 ENCOUNTER — Ambulatory Visit: Admit: 2018-12-22 | Discharge: 2018-12-22 | Payer: MEDICARE | Primary: Cardiovascular Disease

## 2018-12-22 DIAGNOSIS — I255 Ischemic cardiomyopathy: Secondary | ICD-10-CM

## 2018-12-22 NOTE — Telephone Encounter
-----   Message from Vickie Epley, RN sent at 12/22/2018  3:14 PM CST -----  Regarding: FW: Poss fluid on device check in ATCh today.    ----- Message -----  From: Lillie Fragmin, RN  Sent: 12/22/2018   3:09 PM CST  To: Cvm Nurse Gen Card Team Red  Subject: Poss fluid on device check in ATCh today.

## 2018-12-23 ENCOUNTER — Ambulatory Visit: Admit: 2018-12-23 | Discharge: 2018-12-23 | Payer: MEDICARE | Primary: Cardiovascular Disease

## 2018-12-23 ENCOUNTER — Encounter: Admit: 2018-12-23 | Discharge: 2018-12-23 | Payer: MEDICARE | Primary: Cardiovascular Disease

## 2018-12-23 DIAGNOSIS — Z959 Presence of cardiac and vascular implant and graft, unspecified: Secondary | ICD-10-CM

## 2019-01-08 ENCOUNTER — Encounter: Admit: 2019-01-08 | Discharge: 2019-01-08 | Payer: MEDICARE | Primary: Cardiovascular Disease

## 2019-01-14 ENCOUNTER — Encounter: Admit: 2019-01-14 | Discharge: 2019-01-14 | Payer: MEDICARE | Primary: Cardiovascular Disease

## 2019-01-14 DIAGNOSIS — Z95810 Presence of automatic (implantable) cardiac defibrillator: Secondary | ICD-10-CM

## 2019-01-14 DIAGNOSIS — R55 Syncope and collapse: Secondary | ICD-10-CM

## 2019-01-14 DIAGNOSIS — M1990 Unspecified osteoarthritis, unspecified site: Secondary | ICD-10-CM

## 2019-01-14 DIAGNOSIS — T148XXA Other injury of unspecified body region, initial encounter: Secondary | ICD-10-CM

## 2019-01-14 DIAGNOSIS — E785 Hyperlipidemia, unspecified: Secondary | ICD-10-CM

## 2019-01-14 DIAGNOSIS — I10 Essential (primary) hypertension: Secondary | ICD-10-CM

## 2019-01-14 DIAGNOSIS — I472 Ventricular tachycardia: Secondary | ICD-10-CM

## 2019-01-14 DIAGNOSIS — I255 Ischemic cardiomyopathy: Secondary | ICD-10-CM

## 2019-01-14 DIAGNOSIS — I2510 Atherosclerotic heart disease of native coronary artery without angina pectoris: Secondary | ICD-10-CM

## 2019-01-14 DIAGNOSIS — N289 Disorder of kidney and ureter, unspecified: Secondary | ICD-10-CM

## 2019-01-14 DIAGNOSIS — Z8679 Personal history of other diseases of the circulatory system: Secondary | ICD-10-CM

## 2019-01-14 DIAGNOSIS — Z955 Presence of coronary angioplasty implant and graft: Secondary | ICD-10-CM

## 2019-01-14 NOTE — Progress Notes
Date of Service: 01/14/2019    Logan Bright is a 83 y.o. male.       HPI     Logan Bright was in the [place] clinic today for follow-up regarding cardiomyopathy and ventricular tachycardia.  He had a complex hospitalization at [facility] over the past 3 months that involved Covid and an exacerbation of heart failure.  He spent quite a bit of time in rehab but has been back in his home over the past several weeks.    He denies problems with breathlessness or chest discomfort.  He has had no problems with syncope or near syncope.  He has mild ankle edema but says that his weight has been relatively stable.         Vitals:    01/14/19 1505 01/14/19 1506   BP: (!) 140/70 134/68   BP Source: Arm, Left Upper Arm, Right Upper   Patient Position: Sitting Sitting   Pulse: 76    Temp: 36.3 ?C (97.3 ?F)    TempSrc: Oral    SpO2: 98%    Weight: 87.8 kg (193 lb 9.6 oz)    Height: 1.829 m (6')    PainSc: Zero      Body mass index is 26.26 kg/m?[name]     Past Medical History  Patient Active Problem List    Diagnosis Date Noted   ? History of polycythemia 12/14/2018   ? Hyponatremia 09/04/2018   ? Hypothyroidism 09/04/2018   ? Paroxysmal atrial fibrillation (HCC) 04/07/2018   ? 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 No hemodynamically significant common or internal carotid artery stenosis.     ? Closed trimalleolar fracture of right ankle 11/12/2015   ? Debility 06/10/2013   ? Recurrent ventricular tachycardia w/ 2 VT ablations 04/2013 05/11/2013     Sustained VT w/ multiple ICD tachypacing and Shocks, failed amiodaron   05/05/13 & 05/12/13  VT ablations [facility] w/ impella support 4/15, Home off Coreg and losartan d/t post procedure hematoma, hypotension     ? Automatic implantable cardioverter-defibrillator in situ 10/14/2008     Fidelis lead fracture Suspected 09/24/11  Generator replacemetn: Medtronic Evera ICD, Fidelis lead extratrion, implant new RA, RV leads, Dr. Ermelinda Das.        ? Ischemic cardiomyopathy      6/02 - infarct 2 stents .  [facility]            4/04 - Surveillance stress test at [facility]: reportedly negative.  4/05 - Stress thallium study: EF 30%, LAD and RCA defects, elevated lung uptake.- Echo doppler: EF 25%, amt& inf hypokinesis,  apical akinesis, poss  thrombus, mild valvular Regurg       5/06 - Stress thallium: EF 22%, no signficant interval change.  08/2011 - Dobut echo.  EF 10-15%. Antero-apical akinesis/dyskinesis.  No ischemia.  04/2013 RegadenThall EF 23% Extensive Anterior infarct, no ischemia, unchanged from prior     ? Acute on chronic combined systolic (congestive) and diastolic (congestive) heart failure (HCC)       2003 - Status post ICD implant.    4/05 - EF 25-30% per stress thallium and echo doppler.  08/2011 - Echo EF 10-15%     ? Neurologic cardiac syncope        3/05 - Developed syncope while at restaurant, taken to  ED.  Symptoms of diaphoresis                    and lightheadedness.          4/05 - Head up tilt study: positive for inducing hypotension 72/49. Produced symptoms                    c/w previous syncopal episode.     ? HTN (hypertension)                 2002 - Diagnosis established.     ? Dyslipidemia         09/26/2008  03/14/2009  02/26/2011  04/17/2012  03/31/2013   LDL 107 (H) 141 (H) 80 108 (H) 104 (H)   04/2013: Refuses Statins understands that they reduce recurrent CV events, still declines.      ? Gout    ? CKD (chronic kidney disease)      mild           Review of Systems   Constitution: Negative.   HENT: Negative.    Eyes: Negative.    Cardiovascular: Positive for leg swelling.   Respiratory: Negative.    Endocrine: Negative.    Hematologic/Lymphatic: Negative.    Skin: Negative.    Gastrointestinal: Negative.    Genitourinary: Positive for dysuria, flank pain and hesitancy.        Staph infection Neurological: Negative.    Psychiatric/Behavioral: Negative.    Allergic/Immunologic: Negative.         Staph Infection       Physical Exam    Physical Exam   General Appearance: no distress   Skin: warm, no ulcers or xanthomas   Digits and Nails: no cyanosis or clubbing   Eyes: conjunctivae and lids normal, pupils are equal and round   Teeth/Gums/Palate: dentition unremarkable, no lesions   Lips & Oral Mucosa: no pallor or cyanosis   Neck Veins: normal JVP , neck veins are not distended   Thyroid: no nodules, masses, tenderness or enlargement   Chest Inspection: chest is normal in appearance   Respiratory Effort: breathing comfortably, no respiratory distress   Auscultation/Percussion: lungs clear to auscultation, no rales or rhonchi, no wheezing   PMI: PMI not enlarged or displaced   Cardiac Rhythm: regular rhythm and normal rate   Cardiac Auscultation: S1, S2 normal, no rub, no gallop   Murmurs: no murmur   Peripheral Circulation: normal peripheral circulation   Carotid Arteries: normal carotid upstroke bilaterally, no bruits   Radial Arteries: normal symmetric radial pulses   Abdominal Aorta: no abdominal aortic bruit   Pedal Pulses: normal symmetric pedal pulses   Lower Extremity Edema: trace bilateral lower extremity edema   Abdominal Exam: soft, non-tender, no masses, bowel sounds normal   Liver & Spleen: no organomegaly   Gait & Station: walks without assistance   Muscle Strength: normal muscle tone   Orientation: oriented to time, place and person   Affect & Mood: appropriate and sustained affect   Language and Memory: patient responsive and seems to comprehend information   Neurologic Exam: neurological assessment grossly intact   Other: moves all extremities      Problems Addressed Today  No diagnosis found.    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  Ischemic Cardiomyopathy:    - GDMT as described above  ?  Coronary Artery Disease:  - He is not describing any anginal type symptoms, continue on aspirin   ?  Atrial Fibrillation (Paroxysmal):  - He denies palpitations, on amiodarone and Toprol-XL  - He is on Eliquis oral anticoagulation with no significant signs / symptoms of bleeding   ?  Hypertension:  -BP better since discontinuing losartan.  Given his autonomic dysfunction we'll need to be careful not to lower BP excessively.  Today's BP is about as low as I'd recommend for him.   ?  Chronic Kidney Disease - Stage III:         Current Medications (including today's revisions)  ? allopurinoL (ZYLOPRIM) 100 mg tablet Take one tablet by mouth at bedtime daily. Take with food.   ? amiodarone (CORDARONE) 200 mg tablet Take one tablet by mouth daily. Take with food.   ? aspirin EC 81 mg tablet Take 81 mg by mouth daily. Take with food.   ? furosemide (LASIX) 40 mg tablet Take one tablet by mouth daily.   ? levothyroxine (SYNTHROID) 50 mcg tablet Take one tablet by mouth daily 30 minutes before breakfast.   ? metoprolol XL (TOPROL XL) 25 mg extended release tablet Take one tablet by mouth daily.   ? nitrofurantoin monohyd/m-cryst (MACROBID) 100 mg capsule Take 100 mg by mouth every 12 hours. Take with food.   ? potassium chloride (KLOR-CON) 10 mEq tablet Take one tablet by mouth daily. Take with a meal and a full glass of water.   ? STIMULANT LAXATIVE PLUS 8.6-50 mg tablet Take 1 tablet by mouth twice daily (at 10AM and 10PM).

## 2019-01-14 NOTE — Patient Instructions
1.  No change in medications  2.  Follow-up Cardiology appointment in about 3 months

## 2019-02-09 ENCOUNTER — Encounter: Admit: 2019-02-09 | Discharge: 2019-02-09 | Payer: MEDICARE | Primary: Cardiovascular Disease

## 2019-02-09 DIAGNOSIS — Z95810 Presence of automatic (implantable) cardiac defibrillator: Secondary | ICD-10-CM

## 2019-02-09 DIAGNOSIS — R55 Syncope and collapse: Secondary | ICD-10-CM

## 2019-02-09 DIAGNOSIS — I480 Paroxysmal atrial fibrillation: Secondary | ICD-10-CM

## 2019-02-09 DIAGNOSIS — I255 Ischemic cardiomyopathy: Secondary | ICD-10-CM

## 2019-03-17 ENCOUNTER — Encounter: Admit: 2019-03-17 | Discharge: 2019-03-17 | Payer: MEDICARE | Primary: Cardiovascular Disease

## 2019-03-17 DIAGNOSIS — I480 Paroxysmal atrial fibrillation: Secondary | ICD-10-CM

## 2019-03-17 DIAGNOSIS — Z79899 Other long term (current) drug therapy: Secondary | ICD-10-CM

## 2019-03-17 DIAGNOSIS — I255 Ischemic cardiomyopathy: Secondary | ICD-10-CM

## 2019-03-24 ENCOUNTER — Ambulatory Visit: Admit: 2019-03-24 | Discharge: 2019-03-24 | Payer: MEDICARE | Primary: Cardiovascular Disease

## 2019-03-24 ENCOUNTER — Encounter: Admit: 2019-03-24 | Discharge: 2019-03-24 | Payer: MEDICARE | Primary: Cardiovascular Disease

## 2019-03-24 DIAGNOSIS — R55 Syncope and collapse: Secondary | ICD-10-CM

## 2019-03-24 DIAGNOSIS — Z95810 Presence of automatic (implantable) cardiac defibrillator: Secondary | ICD-10-CM

## 2019-03-24 DIAGNOSIS — I480 Paroxysmal atrial fibrillation: Secondary | ICD-10-CM

## 2019-03-24 DIAGNOSIS — I255 Ischemic cardiomyopathy: Secondary | ICD-10-CM

## 2019-04-20 ENCOUNTER — Encounter: Admit: 2019-04-20 | Discharge: 2019-04-20 | Payer: MEDICARE | Primary: Cardiovascular Disease

## 2019-04-20 ENCOUNTER — Ambulatory Visit: Admit: 2019-04-20 | Discharge: 2019-04-20 | Payer: MEDICARE | Primary: Cardiovascular Disease

## 2019-04-20 DIAGNOSIS — Z95810 Presence of automatic (implantable) cardiac defibrillator: Secondary | ICD-10-CM

## 2019-04-20 DIAGNOSIS — I255 Ischemic cardiomyopathy: Secondary | ICD-10-CM

## 2019-04-20 DIAGNOSIS — Z959 Presence of cardiac and vascular implant and graft, unspecified: Secondary | ICD-10-CM

## 2019-04-26 ENCOUNTER — Encounter: Admit: 2019-04-26 | Discharge: 2019-04-26 | Payer: MEDICARE | Primary: Cardiovascular Disease

## 2019-04-27 ENCOUNTER — Encounter: Admit: 2019-04-27 | Discharge: 2019-04-27 | Payer: MEDICARE | Primary: Cardiovascular Disease

## 2019-04-27 DIAGNOSIS — Z955 Presence of coronary angioplasty implant and graft: Secondary | ICD-10-CM

## 2019-04-27 DIAGNOSIS — E785 Hyperlipidemia, unspecified: Secondary | ICD-10-CM

## 2019-04-27 DIAGNOSIS — M1990 Unspecified osteoarthritis, unspecified site: Secondary | ICD-10-CM

## 2019-04-27 DIAGNOSIS — Z95810 Presence of automatic (implantable) cardiac defibrillator: Secondary | ICD-10-CM

## 2019-04-27 DIAGNOSIS — I10 Essential (primary) hypertension: Secondary | ICD-10-CM

## 2019-04-27 DIAGNOSIS — N183 Stage 3 chronic kidney disease, unspecified whether stage 3a or 3b CKD (HCC): Secondary | ICD-10-CM

## 2019-04-27 DIAGNOSIS — Z8679 Personal history of other diseases of the circulatory system: Secondary | ICD-10-CM

## 2019-04-27 DIAGNOSIS — I480 Paroxysmal atrial fibrillation: Secondary | ICD-10-CM

## 2019-04-27 DIAGNOSIS — I255 Ischemic cardiomyopathy: Secondary | ICD-10-CM

## 2019-04-27 DIAGNOSIS — R55 Syncope and collapse: Secondary | ICD-10-CM

## 2019-04-27 DIAGNOSIS — T148XXA Other injury of unspecified body region, initial encounter: Secondary | ICD-10-CM

## 2019-04-27 DIAGNOSIS — I2510 Atherosclerotic heart disease of native coronary artery without angina pectoris: Secondary | ICD-10-CM

## 2019-04-27 DIAGNOSIS — N289 Disorder of kidney and ureter, unspecified: Secondary | ICD-10-CM

## 2019-04-27 NOTE — Progress Notes
Date of Service: 04/27/2019    Logan Bright is a 84 y.o. male.       HPI     Logan Bright was in the [place] clinic today for follow-up regarding heart failure and paroxysmal atrial fibrillation.  He was accompanied by his caregiver, a lady who owns a restaurant down the street from where he lives.  She and another lady are absolute angels for Logan Bright and keep him fed and watch over him.  They fill a pillbox for him so that he takes his medications reliably.  There is absolutely no way he would be able to survive without their faithful assistance.    He had a 29-month stay in [place] last fall related to Covid.  The infection resulted in a complicated exacerbation of heart failure.  He required a stay in the skilled nursing unit for several weeks but has been home since December.    His caregiver indicates that he is steadily gotten stronger since being home.  He has had a couple of episodes of vagal syncope since he has been home.  This has been a longstanding problem for him but had been relatively quiesced sent for a number of years.  He apparently had a couple of these episodes while he was in skilled nursing last fall.    He has not injured himself with the falls.  Other than these focal episodes he denies any lightheadedness or syncope.    He refuses to use a cane or a walker.  It does not sound like he has had any falls other than those associated with the fainting episodes.    He says that his breathing feels fine and he denies any chest pain.  He denies any palpitations.  He has had no problems with TIA or stroke symptoms.         Vitals:    04/27/19 0924   BP: 128/86   BP Source: Arm, Left Upper   Patient Position: Sitting   Pulse: 81   SpO2: 98%   Weight: 89.9 kg (198 lb 3.2 oz)   Height: 1.829 m (6')   PainSc: Zero     Body mass index is 26.88 kg/m?[name]     Past Medical History  Patient Active Problem List    Diagnosis Date Noted   ? History of polycythemia 12/14/2018   ? Hyponatremia 09/04/2018   ? Hypothyroidism 09/04/2018   ? Paroxysmal atrial fibrillation (HCC) 04/07/2018   ? Cryptogenic stroke [facility]) 02/06/2016     2017 - Eye doctor told him he'd had an old stroke after doing an eye exam    08/06/16 Carotid Ultrasound [facility]): Bilateral antegrade vertebral artery flow. No hemodynamically significant common or internal carotid artery stenosis.     ? Closed trimalleolar fracture of right ankle 11/12/2015   ? Debility 06/10/2013   ? Recurrent ventricular tachycardia w/ 2 VT ablations 04/2013 05/11/2013     Sustained VT w/ multiple ICD tachypacing and Shocks, failed amiodaron   05/05/13 & 05/12/13  VT ablations [facility] w/ impella support 4/15, Home off Coreg and losartan d/t post procedure hematoma, hypotension     ? 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 Ermelinda Das.        ? Ischemic cardiomyopathy      6/02 - infarct 2 stents .  [facility]  4/04 - Surveillance stress test at [facility]: reportedly negative.  4/05 - Stress thallium study: EF 30%, LAD and RCA defects, elevated lung uptake.- Echo doppler: EF 25%, amt& inf hypokinesis,  apical akinesis, poss  thrombus, mild valvular Regurg       5/06 - Stress thallium: EF 22%, no signficant interval change.  08/2011 - Dobut echo.  EF 10-15%. Antero-apical akinesis/dyskinesis.  No ischemia.  04/2013 RegadenThall EF 23% Extensive Anterior infarct, no ischemia, unchanged from prior     ? Acute on chronic combined systolic (congestive) and diastolic (congestive) heart failure (HCC)       2003 - Status post ICD implant.    4/05 - EF 25-30% per stress thallium and echo doppler.  08/2011 - Echo EF 10-15%     ? Neurologic cardiac syncope        3/05 - Developed syncope while at restaurant, taken to ED.  Symptoms of diaphoresis                    and lightheadedness. 4/05 - Head up tilt study: positive for inducing hypotension 72/49. Produced symptoms                    c/w previous syncopal episode.     ? HTN (hypertension)                 2002 - Diagnosis established.     ? Dyslipidemia         09/26/2008  03/14/2009  02/26/2011  04/17/2012  03/31/2013   LDL 107 (H) 141 (H) 80 108 (H) 104 (H)   04/2013: Refuses Statins understands that they reduce recurrent CV events, still declines.      ? Gout    ? CKD (chronic kidney disease)      mild           Review of Systems   Constitution: Negative.   HENT: Negative.    Eyes: Negative.    Cardiovascular: Negative.    Respiratory: Negative.    Endocrine: Negative.    Hematologic/Lymphatic: Negative.    Skin: Negative.    Musculoskeletal: Negative.    Gastrointestinal: Negative.    Genitourinary: Negative.    Neurological: Positive for loss of balance.   Psychiatric/Behavioral: Negative.    Allergic/Immunologic: Negative.        Physical Exam    Physical Exam   General Appearance: no distress   Skin: warm, no ulcers or xanthomas   Digits and Nails: no cyanosis or clubbing   Eyes: conjunctivae and lids normal, pupils are equal and round   Teeth/Gums/Palate: dentition unremarkable, no lesions   Lips & Oral Mucosa: no pallor or cyanosis   Neck Veins: normal JVP , neck veins are not distended   Thyroid: no nodules, masses, tenderness or enlargement   Chest Inspection: chest is normal in appearance   Respiratory Effort: breathing comfortably, no respiratory distress   Auscultation/Percussion: lungs clear to auscultation, no rales or rhonchi, no wheezing   PMI: PMI not enlarged or displaced   Cardiac Rhythm: regular rhythm and normal rate   Cardiac Auscultation: S1, S2 normal, no rub, no gallop   Murmurs: no murmur   Peripheral Circulation: normal peripheral circulation   Carotid Arteries: normal carotid upstroke bilaterally, no bruits   Radial Arteries: normal symmetric radial pulses   Abdominal Aorta: no abdominal aortic bruit   Pedal Pulses: normal symmetric pedal pulses   Lower Extremity Edema: no lower extremity edema  Abdominal Exam: soft, non-tender, no masses, bowel sounds normal   Liver & Spleen: no organomegaly   Gait & Station: walks without assistance   Muscle Strength: normal muscle tone   Orientation: oriented to time, place and person   Affect & Mood: appropriate and sustained affect   Language and Memory: patient responsive and seems to comprehend information   Neurologic Exam: neurological assessment grossly intact   Other: moves all extremities      Problems Addressed Today  Encounter Diagnoses   Name Primary?   ? Neurologic cardiac syncope Yes   ? Essential hypertension    ? Ischemic cardiomyopathy    ? Paroxysmal atrial fibrillation (HCC)    ? Dyslipidemia    ? Automatic implantable cardioverter-defibrillator in situ    ? Stage 3 chronic kidney disease, unspecified whether stage 3a or 3b CKD (HCC)        Assessment and Plan      Chronic?Combined Systolic and Diastolic?Heart Failure with Reduced Ejection Fraction: ?  - Echo on?09/04/2018?showed an EF of 20%  - NYHA Functional Class?II?symptoms, appears euvolemic by exam  - GDMT:?furosemide 40 mg daily.  No BB or spiro due to vagal syncope episodes  -?I DC'ed metoprolol today to let BP come up somewhat, due to his vagal syncopal episodes  ?    Coronary Artery Disease:  - He is not describing any anginal type symptoms, continue on aspirin   ?  Atrial Fibrillation (Paroxysmal):  - He denies palpitations,?on amiodarone  - No anti-coagulation due to falls and bleeding risk  ?  Hypertension:  -BP better since discontinuing losartan.  Given his autonomic dysfunction we'll need to be careful not to lower BP excessively.  I DC'ed metoprolol today also.  ?  Chronic Kidney Disease - Stage?III:         Current Medications (including today's revisions)  ? allopurinoL (ZYLOPRIM) 100 mg tablet Take one tablet by mouth at bedtime daily. Take with food.   ? amiodarone (CORDARONE) 200 mg tablet Take one tablet by mouth daily. Take with food.   ? aspirin EC 81 mg tablet Take 81 mg by mouth daily. Take with food.   ? ergocalciferol (VITAMIN D-2) 1,250 mcg (50,000 unit) capsule TAKE 1 CAPSULE BY MOUTH 1 TIME A WEEK   ? furosemide (LASIX) 40 mg tablet Take one tablet by mouth daily.   ? levothyroxine (SYNTHROID) 50 mcg tablet Take one tablet by mouth daily 30 minutes before breakfast.   ? nitrofurantoin monohyd/m-cryst (MACROBID) 100 mg capsule Take 100 mg by mouth every 12 hours. Take with food.   ? potassium chloride (KLOR-CON) 10 mEq tablet Take one tablet by mouth daily. Take with a meal and a full glass of water.   ? STIMULANT LAXATIVE PLUS 8.6-50 mg tablet Take 1 tablet by mouth twice daily (at 10AM and 10PM).     Total time spent on today's office visit was 30 minutes.  This includes face-to-face in person visit with patient as well as nonface-to-face time including review of the EMR, outside records, labs, radiologic studies, echocardiogram & other cardiovascular studies, formation of treatment plan, after visit summary, future disposition, and lastly on documentation.

## 2019-04-27 NOTE — Patient Instructions
1.  Stop metoprolol

## 2019-06-23 ENCOUNTER — Encounter: Admit: 2019-06-23 | Discharge: 2019-06-23 | Payer: MEDICARE | Primary: Cardiovascular Disease

## 2019-07-01 ENCOUNTER — Encounter: Admit: 2019-07-01 | Discharge: 2019-07-01 | Payer: MEDICARE | Primary: Cardiovascular Disease

## 2019-07-01 ENCOUNTER — Emergency Department: Admit: 2019-07-01 | Discharge: 2019-07-01 | Payer: MEDICARE

## 2019-07-01 ENCOUNTER — Inpatient Hospital Stay: Admit: 2019-07-01 | Payer: MEDICARE

## 2019-07-01 DIAGNOSIS — J9601 Acute respiratory failure with hypoxia: Secondary | ICD-10-CM

## 2019-07-01 DIAGNOSIS — I509 Heart failure, unspecified: Principal | ICD-10-CM

## 2019-07-01 LAB — URINALYSIS MICROSCOPIC REFLEX TO CULTURE

## 2019-07-01 LAB — TSH WITH FREE T4 REFLEX: Lab: 3.4 uU/mL — ABNORMAL LOW (ref >40)

## 2019-07-01 LAB — CBC AND DIFF
Lab: 12 % — ABNORMAL LOW (ref 24–44)
Lab: 14 g/dL (ref 13.5–16.5)
Lab: 29 pg (ref 26–34)
Lab: 32 g/dL (ref 32.0–36.0)
Lab: 4.8 M/UL (ref 4.4–5.5)
Lab: 43 % (ref 40–50)
Lab: 5.3 10*3/uL (ref 4.5–11.0)
Lab: 8.7 FL (ref 7–11)
Lab: 90 FL (ref 80–100)

## 2019-07-01 LAB — COMPREHENSIVE METABOLIC PANEL
Lab: 0.9 mg/dL (ref 0.3–1.2)
Lab: 1.6 mg/dL — ABNORMAL HIGH (ref 0.4–1.24)
Lab: 112 mg/dL — ABNORMAL HIGH (ref 70–100)
Lab: 135 MMOL/L — ABNORMAL LOW (ref 137–147)
Lab: 19 MMOL/L — ABNORMAL LOW (ref 21–30)
Lab: 27 U/L (ref 7–56)
Lab: 29 mg/dL — ABNORMAL HIGH (ref 7–25)
Lab: 3.5 g/dL (ref 3.5–5.0)
Lab: 31 U/L (ref 7–40)
Lab: 40 mL/min — ABNORMAL LOW (ref >60)
Lab: 48 mL/min — ABNORMAL LOW (ref >60)
Lab: 6.5 g/dL (ref 6.0–8.0)
Lab: 9.3 mg/dL (ref 8.5–10.6)
Lab: 97 U/L (ref 25–110)
Lab: 13 — ABNORMAL HIGH (ref 3–12)

## 2019-07-01 LAB — BNP (B-TYPE NATRIURETIC PEPTI): Lab: 971 pg/mL — ABNORMAL HIGH (ref <150)

## 2019-07-01 LAB — URINALYSIS DIPSTICK REFLEX TO CULTURE
Lab: NEGATIVE
Lab: NEGATIVE 10*3/uL (ref 0–0.80)
Lab: NEGATIVE K/UL (ref 0–0.20)
Lab: NEGATIVE K/UL (ref 0–0.45)
Lab: NEGATIVE
Lab: NEGATIVE
Lab: NEGATIVE

## 2019-07-01 LAB — POC BLOOD GAS VEN
Lab: 0 MMOL/L
Lab: 30 mmHg — ABNORMAL LOW (ref 33–48)
Lab: 41 mmHg (ref 36–50)
Lab: 7.3 (ref 7.30–7.40)

## 2019-07-01 LAB — LIPID PROFILE
Lab: 116 mg/dL (ref <200)
Lab: 13 mg/dL
Lab: 76 mg/dL (ref <100)
Lab: 84 mg/dL

## 2019-07-01 LAB — POC SODIUM: Lab: 135 MMOL/L — ABNORMAL LOW (ref 137–147)

## 2019-07-01 LAB — COVID-19 (SARS-COV-2) PCR

## 2019-07-01 LAB — PROCALCITONIN
Lab: 0 ng/mL (ref 41–77)
Lab: 0 ng/mL (ref 98–110)

## 2019-07-01 LAB — POC HEMATOCRIT
Lab: 17 g/dL — ABNORMAL HIGH (ref 13.5–16.5)
Lab: 50 % (ref 40–50)

## 2019-07-01 LAB — POC TROPONIN
Lab: 0 ng/mL (ref 0.00–0.05)
Lab: 0 ng/mL — ABNORMAL HIGH (ref 0.00–0.05)

## 2019-07-01 LAB — POC LACTATE: Lab: 1.9 MMOL/L (ref 0.5–2.0)

## 2019-07-01 LAB — POC POTASSIUM: Lab: 8.2 MMOL/L — ABNORMAL HIGH (ref 3.5–5.1)

## 2019-07-01 LAB — MAGNESIUM: Lab: 2.1 mg/dL (ref 1.6–2.6)

## 2019-07-01 MED ORDER — FUROSEMIDE 10 MG/ML IJ SOLN
40 mg | Freq: Once | INTRAVENOUS | 0 refills | Status: CP
Start: 2019-07-01 — End: ?
  Administered 2019-07-01: 23:00:00 40 mg via INTRAVENOUS

## 2019-07-01 MED ORDER — NITROGLYCERIN 0.4 MG SL SUBL
.4 mg | Freq: Once | SUBLINGUAL | 0 refills | Status: CP
Start: 2019-07-01 — End: ?
  Administered 2019-07-01: 16:00:00 0.4 mg via SUBLINGUAL

## 2019-07-01 MED ORDER — ASPIRIN 81 MG PO TBEC
81 mg | Freq: Every day | ORAL | 0 refills | Status: DC
Start: 2019-07-01 — End: 2019-07-04
  Administered 2019-07-01 – 2019-07-04 (×4): 81 mg via ORAL

## 2019-07-01 MED ORDER — AMIODARONE 200 MG PO TAB
200 mg | Freq: Every day | ORAL | 0 refills | Status: DC
Start: 2019-07-01 — End: 2019-07-04
  Administered 2019-07-01 – 2019-07-04 (×3): 200 mg via ORAL

## 2019-07-01 MED ORDER — ALLOPURINOL 100 MG PO TAB
100 mg | Freq: Every evening | ORAL | 0 refills | Status: DC
Start: 2019-07-01 — End: 2019-07-04
  Administered 2019-07-02 – 2019-07-04 (×3): 100 mg via ORAL

## 2019-07-01 MED ORDER — CALCIUM GLUCONATE 100 MG/ML (10%) IV SOLN
1 g | Freq: Once | INTRAVENOUS | 0 refills | Status: DC
Start: 2019-07-01 — End: 2019-07-01

## 2019-07-01 MED ORDER — ENOXAPARIN 40 MG/0.4 ML SC SYRG
40 mg | Freq: Every day | SUBCUTANEOUS | 0 refills | Status: DC
Start: 2019-07-01 — End: 2019-07-04
  Administered 2019-07-02 – 2019-07-04 (×2): 40 mg via SUBCUTANEOUS

## 2019-07-01 MED ORDER — FUROSEMIDE 10 MG/ML IJ SOLN
40 mg | Freq: Once | INTRAVENOUS | 0 refills | Status: CP
Start: 2019-07-01 — End: ?
  Administered 2019-07-01: 18:00:00 40 mg via INTRAVENOUS

## 2019-07-01 MED ORDER — ASPIRIN 81 MG PO CHEW
324 mg | Freq: Once | ORAL | 0 refills | Status: CP
Start: 2019-07-01 — End: ?
  Administered 2019-07-01: 18:00:00 324 mg via ORAL

## 2019-07-01 MED ORDER — CALCIUM GLUCONATE 1G/100ML IVPB (MB+)
1 g | Freq: Once | INTRAVENOUS | 0 refills | Status: CP
Start: 2019-07-01 — End: ?
  Administered 2019-07-01 (×2): 1 g via INTRAVENOUS

## 2019-07-01 MED ORDER — LEVOTHYROXINE 50 MCG PO TAB
50 ug | Freq: Every day | ORAL | 0 refills | Status: DC
Start: 2019-07-01 — End: 2019-07-04
  Administered 2019-07-02 – 2019-07-04 (×2): 50 ug via ORAL

## 2019-07-01 NOTE — ED Notes
This RN informed ED Resident about pt's Troponin level of 0.03

## 2019-07-01 NOTE — H&P (View-Only)
Admission History and Physical Examination      Name:  Logan Bright                                             MRN:  [id number]   Admission Date:  07/01/2019                     Assessment/Plan:    Principal Problem:    Heart failure [facility])      Mr. Logan Bright is an 84 year old male with a medical history of paroxysmal atrial fibrillation (not on anticoagulation), neurocardiogenic syncope, HTN, HLD, CKD, gout, chronic combined CHF status post ICD, history of VT status post VT ablation, CAD status post stent placement who presents to ED with new oxygen requirement and week history of malaise and dyspnea.  Requiring 2L supplemental oxygen in ED (no baseline requirement) and CXR concerning for fluid overload. He is being admitted for decompensated heart failure exacerbation.    #Acute on Chronic Combined Heart Failure Exacerbation  #Ischemic Cardiomyopathy s/p CRT-D  #CAD s/p PCI  #Hx VT s/p VT ablation  - Follows with Dr. Barry Dienes in [facility] Cardiology clinic  - Troponin negative, EKG at baseline with AV paced complexes  - 2-D ECHO (09/04/18) showed EF of 20%, grade II diastolic dysfunctin, severe right atrial dilation with severely reduced function, elevated RAP of 18 mmhg and PAP of 46 mmHg   - PTA GDMT: none- unable to tolerate due to hypotension and vagal syncopal episodes  - PTA Diuretic: Furosemide 40mg  daily  - on PTA ASA 81, has refused statins  Plan:  > Monitor response to lasix dose in ED, will consider repeat dosing  > Spec in lab BNP  > Strict I/O's  > Daily standing weights  > Replace lytes to K>4, Mg >2   > Continue daily ASA  > Lipid panel, A1c, TSH    #Paroxysmal Atrial Fibrillation  - on PTA rhythm control with amiodarone  - previously on low dose eliquis, however, stopped for hematuria and syncope  > Continue amiodarone    #Hypertension  - not on any PTA's due to hypotension/syncopal episodes in setting of known autonomic dysfunction   - blood pressures ranging 120s-140s/80s-90s in ED    #CKD stage III  - Admission Cr 1.66, near baseline ~1.4-1.6  > Monitor on labs    #Hypothyroidism  >Continue PTA synthroid 50 mcg    #Gout  > Continue PTA allopurinol    FEN: none, replace PRN, cardiac diet  Ppx: Lovenox  Code status: DNAR-FI (discussed on admission)    Seen and discussed with Dr. Lennox Solders, MD  Internal Medicine, PGY-1  Pager: [phone]  Available on Voalte and Cureatr      Nutrition: No Dietitian Consult  Wound: No Wound Consult  __________________________________________________________________________________  Primary Care Physician: Vanice Sarah  Verified    Chief Complaint:  Feeling awful    History of Present Illness: Logan Bright is a 84 y.o. male with above medical history.    Came to ED today with main complaint of feeling awful intermittently for the past week. Describes symptoms as a headache and increased SOB with exertion. Reports associated nasal congestion.  Denies associated fever and chills.  Denies recent sick contacts.  Says chronic cough is at his baseline and not productive of sputum.  Has not noticed a change in leg swelling.  Denies PND.  Denies orthopnea, but states he doesn't think he would be able to lay flat if he tried.  Denies chest pain.  Says he thinks he is adherent with his medications.  Does not follow a low sodium diet and just eats what they bring me.  Is unsure what makes his breathing worse.  Subjectively states his breathing has improved after getting a dose of lasix.      Lives in [place], [place] in a house by himself.  Says he can complete ADL's, but has caretakers who do cooking and other chores around house.  Says he is able to walk, but that you doctors told me not to walk.  Does not specify why he was told not to walk.  Says he never smoked.  Does not drink alcohol.  Denies drug use.      Medical History:   Diagnosis Date   ? Arthritis    ? CAD (coronary artery disease)    ? Dyslipidemia    ? Fracture     RT ANKLE   ? History of ventricular tachycardia    ? HTN (hypertension)    ? ICD (implantable cardiac defibrillator) in place    ? Ischemic cardiomyopathy    ? Neurologic cardiac syncope    ? Renal insufficiency     mild   ? S/P coronary artery stent placement      Surgical History:   Procedure Laterality Date   ? EXTERNAL FIXATION APPLICATION ANKLE Right 11/13/2015    Performed by Azzie Glatter, MD at [facility] OR   ? EXTERNAL FIXATION REMOVAL RIGHT ANKLE Right 12/08/2015    Performed by Azzie Glatter, MD at [facility] OR   ? OPEN REDUCTION INTERNAL FIXATION RIGHT ANKLE Right 12/08/2015    Performed by Azzie Glatter, MD at [facility] OR   ? INSERTION LEAD LEFT VENTRICLE AT TIME OF INSERTION OF PACEMAKER/ IMPLANTABLE DEFIBRILLATOR Left 09/09/2018    Performed by Annamarie Dawley, MD at [facility] EP LAB   ? REMOVAL AND REPLACEMENT PERMANENT PACEMAKER GENERATOR  - MULTIPLE LEAD SYSTEM Left 09/09/2018    Performed by Annamarie Dawley, MD at [facility] EP LAB   ? CARDIAC DEFIBRILLATOR PLACEMENT     ? HX HEART CATHETERIZATION     ? HX ICD PLACEMENT     ? HX PACEMAKER PLACEMENT       Family history reviewed; non-contributory  Social History     Socioeconomic History   ? Marital status: Widowed     Spouse name: Not on file   ? Number of children: Not on file   ? Years of education: Not on file   ? Highest education level: Not on file   Occupational History   ? Not on file   Tobacco Use   ? Smoking status: Never Smoker   ? Smokeless tobacco: Never Used   Substance and Sexual Activity   ? Alcohol use: No   ? Drug use: No   ? Sexual activity: Not on file   Other Topics Concern   ? Not on file   Social History Narrative   ? Not on file     Social Determinants of Health     Financial Resource Strain:    ? Difficulty of Paying Living Expenses:    Food Insecurity:    ? Worried About Programme researcher, broadcasting/film/video in the Last Year:    ? Barista in the  Last Year:    Transportation Needs:    ? Freight forwarder (Medical):    ? Lack of Transportation (Non-Medical):    Physical Activity:    ? Days of Exercise per Week:    ? Minutes of Exercise per Session:    Stress:    ? Feeling of Stress :    Social Connections:    ? Frequency of Communication with Friends and Family:    ? Frequency of Social Gatherings with Friends and Family:    ? Attends Religious Services:    ? Active Member of Clubs or Organizations:    ? Attends Banker Meetings:    ? Marital Status:    Intimate Partner Violence:    ? Fear of Current or Ex-Partner:    ? Emotionally Abused:    ? Physically Abused:    ? Sexually Abused:       Vaping/E-liquid Use   ? Vaping Use Never User                 Immunizations (includes history and patient reported):   Immunization History   Administered Date(s) Administered   ? Tdap Vaccine 11/11/2015           Allergies:  Chlorhexidine gluconate and Colchicine    Medications:  (Not in a hospital admission)    Review of Systems:  A 14 point review of systems was negative except for: intermittent headache, congestion, malaise, and shortness of breath.    Physical Exam:  Vital Signs: Last Filed In 24 Hours Vital Signs: 24 Hour Range   BP: 131/89 (06/03 1430)  Temp: 36.4 ?C (97.6 ?F) (06/03 1610)  Pulse: 66 (06/03 1430)  Respirations: 26 PER MINUTE (06/03 1430)  SpO2: 97 % (06/03 1430)  SpO2 Pulse: 66 (06/03 1430)  Height: 182.9 cm (72) (06/03 0833) BP: (126-148)/(79-97)   Temp:  [36.4 ?C (97.6 ?F)]   Pulse:  [60-66]   Respirations:  [15 PER MINUTE-29 PER MINUTE]   SpO2:  [95 %-98 %]           General:  Alert, cooperative, no distress, appears stated age  Head:  Normocephalic, without obvious abnormality, atraumatic  Eyes:  Conjunctivae/corneas clear.  PERRL, EOMs intact.  Neck:  Supple, symmetrical, trachea midline, jugular veins distended, +HJR  Lungs:  Clear to auscultation bilaterally  Heart:    Regular rate and rhythm, S1, S2 normal, no murmur, click rub or gallop  Abdomen:  Soft, non-tender.  Bowel sounds normal.  No masses.  No organomegaly.  Extremities:  Extremities with pitting edema bilaterally, atraumatic, no cyanosis or edema  Peripheral pulses:   2+ and symmetric, all extremities  Neurologic:  CNII - XII intact.  Normal strength, sensation throughout.      Lab/Radiology/Other Diagnostic Tests:  24-hour labs:    Results for orders placed or performed during the hospital encounter of 07/01/19 (from the past 24 hour(s))   POC TROPONIN    Collection Time: 07/01/19  8:43 AM   Result Value Ref Range    Troponin-I-POC 0.03 0.00 - 0.05 NG/ML   CBC AND DIFF    Collection Time: 07/01/19  9:20 AM   Result Value Ref Range    White Blood Cells 5.3 4.5 - 11.0 K/UL    RBC 4.80 4.4 - 5.5 M/UL    Hemoglobin 14.3 13.5 - 16.5 GM/DL    Hematocrit 96.0 40 - 50 %    MCV 90.3 80 - 100 FL    MCH  29.7 26 - 34 PG    MCHC 32.9 32.0 - 36.0 G/DL    RDW 16.1 (H) 11 - 15 %    Platelet Count 164 150 - 400 K/UL    MPV 8.7 7 - 11 FL    Neutrophils 77 41 - 77 %    Lymphocytes 12 (L) 24 - 44 %    Monocytes 11 4 - 12 %    Eosinophils 0 0 - 5 %    Basophils 0 0 - 2 %    Absolute Neutrophil Count 4.02 1.8 - 7.0 K/UL    Absolute Lymph Count 0.63 (L) 1.0 - 4.8 K/UL    Absolute Monocyte Count 0.58 0 - 0.80 K/UL    Absolute Eosinophil Count 0.02 0 - 0.45 K/UL    Absolute Basophil Count 0.02 0 - 0.20 K/UL    MDW (Monocyte Distribution Width) 20.1 <20.7   COVID-19 (SARS-COV-2) PCR    Collection Time: 07/01/19  9:20 AM    Specimen: Nasopharyngeal; Flocked Swab   Result Value Ref Range    COVID-19 (SARS-CoV-2) PCR Source FLOCKED SWAB  NASOPHARYNGEAL       COVID-19 (SARS-CoV-2) PCR NOT DETECTED DN-NOT DETECTED   PROCALCITONIN    Collection Time: 07/01/19  9:20 AM   Result Value Ref Range    Procalcitonin 0.07 ng/mL   POC LACTATE    Collection Time: 07/01/19  9:22 AM   Result Value Ref Range    LACTIC ACID POC 1.9 0.5 - 2.0 MMOL/L   POC BLOOD GAS VEN    Collection Time: 07/01/19  9:26 AM   Result Value Ref Range    PH-VEN-POC 7.39 7.30 - 7.40    PCO2-VEN-POC 41 36 - 50 MMHG    PO2-VEN-POC 30 (L) 33 - 48 MMHG    Base Ex-VEN-POC 0.0 MMOL/L    O2 Sat-VEN-POC 55.0 55 - 71 %    Bicarbonate-VEN-POC 24.6 MMOL/L   POC HEMATOCRIT    Collection Time: 07/01/19  9:26 AM   Result Value Ref Range    Hemoglobin POC 17.0 (H) 13.5 - 16.5 GM/DL    Hematocrit POC 09.6 40 - 50 %   POC POTASSIUM    Collection Time: 07/01/19  9:26 AM   Result Value Ref Range    Potassium-POC 8.2 (HH) 3.5 - 5.1 MMOL/L   POC SODIUM    Collection Time: 07/01/19  9:26 AM   Result Value Ref Range    Sodium-POC 135 (L) 137 - 147 MMOL/L   COMPREHENSIVE METABOLIC PANEL    Collection Time: 07/01/19 10:48 AM   Result Value Ref Range    Sodium 135 (L) 137 - 147 MMOL/L    Potassium 5.0 3.5 - 5.1 MMOL/L    Chloride 103 98 - 110 MMOL/L    Glucose 112 (H) 70 - 100 MG/DL    Blood Urea Nitrogen 29 (H) 7 - 25 MG/DL    Creatinine 0.45 (H) 0.4 - 1.24 MG/DL    Calcium 9.3 8.5 - 40.9 MG/DL    Total Protein 6.5 6.0 - 8.0 G/DL    Total Bilirubin 0.9 0.3 - 1.2 MG/DL    Albumin 3.5 3.5 - 5.0 G/DL    Alk Phosphatase 97 25 - 110 U/L    AST (SGOT) 31 7 - 40 U/L    CO2 19 (L) 21 - 30 MMOL/L    ALT (SGPT) 27 7 - 56 U/L    Anion Gap 13 (H) 3 - 12    eGFR Non  African American 40 (L) >60 mL/min    eGFR African American 48 (L) >60 mL/min   MAGNESIUM    Collection Time: 07/01/19 10:48 AM   Result Value Ref Range    Magnesium 2.1 1.6 - 2.6 mg/dL   PROCALCITONIN    Collection Time: 07/01/19 10:48 AM   Result Value Ref Range    Procalcitonin 0.09 ng/mL   URINALYSIS DIPSTICK REFLEX TO CULTURE    Collection Time: 07/01/19 11:55 AM    Specimen: Urine   Result Value Ref Range    Color,UA YELLOW     Turbidity,UA CLEAR CLEAR-CLEAR    Specific Gravity-Urine 1.017 1.003 - 1.035    pH,UA 6.0 5.0 - 8.0    Protein,UA 1+ (A) NEG-NEG    Glucose,UA NEG NEG-NEG    Ketones,UA NEG NEG-NEG    Bilirubin,UA NEG NEG-NEG    Blood,UA NEG NEG-NEG    Urobilinogen,UA NORMAL NORM-NORMAL    Nitrite,UA NEG NEG-NEG    Leukocytes,UA NEG NEG-NEG    Urine Ascorbic Acid, UA NEG NEG-NEG   URINALYSIS MICROSCOPIC REFLEX TO CULTURE Collection Time: 07/01/19 11:55 AM    Specimen: Urine   Result Value Ref Range    WBCs,UA 0-2 0 - 2 /HPF    RBCs,UA 0-2 0 - 3 /HPF    Comment,UA       Criteria for reflex to culture are WBC>10, Positive Nitrite, and/or >=+1   leukocytes. If quantity is not sufficient, an addendum will follow.      MucousUA TRACE    POC TROPONIN    Collection Time: 07/01/19 12:45 PM   Result Value Ref Range    Troponin-I-POC 0.03 0.00 - 0.05 NG/ML     Glucose: (!) 112 (07/01/19 1048)  Pertinent radiology reviewed.    Leida Lauth, MD  Pager [phone]

## 2019-07-01 NOTE — Progress Notes
RT Adult Assessment Note    NAME:Logan Bright             MRN: [id number]             DOB:02/04/1935          AGE: 84 y.o.  ADMISSION DATE: 07/01/2019             DAYS ADMITTED: LOS: 0 days    RT Treatment Plan:       Protocol Plan: Procedures  PAP: Place a nursing order for IS Q1h While Awake for any of Lung Expansion indicators  Oxygen/Humidity: O2 to keep SpO2 > 92%, if not on any RT modality, D/C protocol if greater than 24 hours on room air  SpO2: BID & PRN    Additional Comments:  Impressions of the patient: Tx plan above, Pt does not wear O2 @ baseline    Patient education that was completed: IS, with nursing  Recommendations to the care team: N/A    Vital Signs:  Pulse: 62  RR: 16 PER MINUTE  SpO2: 96 %  O2 Device: Cannula  Liter Flow: 2 Lpm  O2%:    Breath Sounds: Clear (Implies normal)  Respiratory Effort: Non-Labored

## 2019-07-01 NOTE — ED Notes
This RN assisted pt with urinal. Pt unable to urinate at this time and stated he would try again later.

## 2019-07-01 NOTE — ED Notes
This RN agrees with previously documented pt assessment. Pt denies CP, N/V/D, SOB. Pt resting comfortably on cart, VSS on 2 L NC, with no additional complaints. Cart lowered and locked, call light within reach.

## 2019-07-01 NOTE — ED Notes
This RN informed ED Resident about pt's potassium level of 5.0. Provider to e

## 2019-07-01 NOTE — ED Notes
Per Lab, blood sample hemolyzed. New orders and new blood specimen mint tube needed. ED Resident notified.

## 2019-07-02 ENCOUNTER — Encounter: Admit: 2019-07-02 | Discharge: 2019-07-02 | Payer: MEDICARE | Primary: Cardiovascular Disease

## 2019-07-02 MED ORDER — FUROSEMIDE 10 MG/ML IJ SOLN
60 mg | Freq: Once | INTRAVENOUS | 0 refills | Status: CP
Start: 2019-07-02 — End: ?
  Administered 2019-07-02: 16:00:00 60 mg via INTRAVENOUS

## 2019-07-03 ENCOUNTER — Inpatient Hospital Stay: Admit: 2019-07-03 | Discharge: 2019-07-03 | Payer: MEDICARE | Primary: Cardiovascular Disease

## 2019-07-03 MED ORDER — PERFLUTREN LIPID MICROSPHERES 1.1 MG/ML IV SUSP
1-20 mL | Freq: Once | INTRAVENOUS | 0 refills | Status: CP | PRN
Start: 2019-07-03 — End: ?
  Administered 2019-07-03: 17:00:00 2 mL via INTRAVENOUS

## 2019-07-03 MED ORDER — FUROSEMIDE 40 MG PO TAB
40 mg | Freq: Once | ORAL | 0 refills | Status: DC
Start: 2019-07-03 — End: 2019-07-03

## 2019-07-03 MED ORDER — FUROSEMIDE 40 MG PO TAB
40 mg | Freq: Every day | ORAL | 0 refills | Status: DC
Start: 2019-07-03 — End: 2019-07-04
  Administered 2019-07-03 – 2019-07-04 (×2): 40 mg via ORAL

## 2019-07-04 ENCOUNTER — Encounter: Admit: 2019-07-04 | Discharge: 2019-07-04 | Payer: MEDICARE | Primary: Cardiovascular Disease

## 2019-07-04 MED ORDER — FUROSEMIDE 40 MG PO TAB
60 mg | ORAL_TABLET | Freq: Every day | ORAL | 3 refills | 90.00000 days | Status: DC
Start: 2019-07-04 — End: 2019-07-21
  Filled 2019-07-04: qty 90, 90d supply, fill #1

## 2019-07-05 ENCOUNTER — Encounter: Admit: 2019-07-05 | Discharge: 2019-07-05 | Payer: MEDICARE | Primary: Cardiovascular Disease

## 2019-07-06 ENCOUNTER — Ambulatory Visit
Admit: 2019-07-06 | Discharge: 2019-07-07 | Payer: MEDICARE | Primary: Student in an Organized Health Care Education/Training Program

## 2019-07-06 ENCOUNTER — Encounter
Admit: 2019-07-06 | Discharge: 2019-07-06 | Payer: MEDICARE | Primary: Student in an Organized Health Care Education/Training Program

## 2019-07-06 DIAGNOSIS — I255 Ischemic cardiomyopathy: Secondary | ICD-10-CM

## 2019-07-06 DIAGNOSIS — E785 Hyperlipidemia, unspecified: Secondary | ICD-10-CM

## 2019-07-06 DIAGNOSIS — I2510 Atherosclerotic heart disease of native coronary artery without angina pectoris: Secondary | ICD-10-CM

## 2019-07-06 DIAGNOSIS — Z95810 Presence of automatic (implantable) cardiac defibrillator: Secondary | ICD-10-CM

## 2019-07-06 DIAGNOSIS — M1990 Unspecified osteoarthritis, unspecified site: Secondary | ICD-10-CM

## 2019-07-06 DIAGNOSIS — N289 Disorder of kidney and ureter, unspecified: Secondary | ICD-10-CM

## 2019-07-06 DIAGNOSIS — T148XXA Other injury of unspecified body region, initial encounter: Secondary | ICD-10-CM

## 2019-07-06 DIAGNOSIS — I10 Essential (primary) hypertension: Secondary | ICD-10-CM

## 2019-07-06 DIAGNOSIS — R55 Syncope and collapse: Secondary | ICD-10-CM

## 2019-07-06 DIAGNOSIS — Z955 Presence of coronary angioplasty implant and graft: Secondary | ICD-10-CM

## 2019-07-06 DIAGNOSIS — Z8679 Personal history of other diseases of the circulatory system: Secondary | ICD-10-CM

## 2019-07-09 ENCOUNTER — Encounter
Admit: 2019-07-09 | Discharge: 2019-07-09 | Payer: MEDICARE | Primary: Student in an Organized Health Care Education/Training Program

## 2019-07-14 ENCOUNTER — Encounter
Admit: 2019-07-14 | Discharge: 2019-07-14 | Payer: MEDICARE | Primary: Student in an Organized Health Care Education/Training Program

## 2019-07-21 ENCOUNTER — Ambulatory Visit
Admit: 2019-07-21 | Discharge: 2019-07-21 | Payer: MEDICARE | Primary: Student in an Organized Health Care Education/Training Program

## 2019-07-21 ENCOUNTER — Encounter
Admit: 2019-07-21 | Discharge: 2019-07-21 | Payer: MEDICARE | Primary: Student in an Organized Health Care Education/Training Program

## 2019-07-21 DIAGNOSIS — M1990 Unspecified osteoarthritis, unspecified site: Secondary | ICD-10-CM

## 2019-07-21 DIAGNOSIS — I10 Essential (primary) hypertension: Secondary | ICD-10-CM

## 2019-07-21 DIAGNOSIS — Z95810 Presence of automatic (implantable) cardiac defibrillator: Secondary | ICD-10-CM

## 2019-07-21 DIAGNOSIS — N183 Stage 3 chronic kidney disease, unspecified whether stage 3a or 3b CKD (HCC): Secondary | ICD-10-CM

## 2019-07-21 DIAGNOSIS — E785 Hyperlipidemia, unspecified: Secondary | ICD-10-CM

## 2019-07-21 DIAGNOSIS — I2510 Atherosclerotic heart disease of native coronary artery without angina pectoris: Secondary | ICD-10-CM

## 2019-07-21 DIAGNOSIS — N289 Disorder of kidney and ureter, unspecified: Secondary | ICD-10-CM

## 2019-07-21 DIAGNOSIS — I255 Ischemic cardiomyopathy: Secondary | ICD-10-CM

## 2019-07-21 DIAGNOSIS — I5043 Acute on chronic combined systolic (congestive) and diastolic (congestive) heart failure: Secondary | ICD-10-CM

## 2019-07-21 DIAGNOSIS — R55 Syncope and collapse: Secondary | ICD-10-CM

## 2019-07-21 DIAGNOSIS — Z8679 Personal history of other diseases of the circulatory system: Secondary | ICD-10-CM

## 2019-07-21 DIAGNOSIS — I480 Paroxysmal atrial fibrillation: Secondary | ICD-10-CM

## 2019-07-21 DIAGNOSIS — Z955 Presence of coronary angioplasty implant and graft: Secondary | ICD-10-CM

## 2019-07-21 DIAGNOSIS — T148XXA Other injury of unspecified body region, initial encounter: Secondary | ICD-10-CM

## 2019-07-21 LAB — BASIC METABOLIC PANEL
Lab: 1.8 mg/dL — ABNORMAL HIGH (ref 0.4–1.24)
Lab: 121 mg/dL — ABNORMAL HIGH (ref 70–100)
Lab: 137 MMOL/L (ref 137–147)
Lab: 21 mg/dL (ref 7–25)
Lab: 26 MMOL/L (ref 21–30)
Lab: 36 mL/min — ABNORMAL LOW (ref >60)
Lab: 4 MMOL/L (ref 3.5–5.1)
Lab: 44 mL/min — ABNORMAL LOW (ref >60)
Lab: 8.6 mg/dL (ref 8.5–10.6)
Lab: 99 MMOL/L (ref 98–110)
Lab: 12 (ref 3–12)

## 2019-07-21 MED ORDER — FUROSEMIDE 40 MG PO TAB
60 mg | ORAL_TABLET | Freq: Every day | ORAL | 0 refills | 90.00000 days | Status: AC
Start: 2019-07-21 — End: ?

## 2019-07-21 NOTE — Progress Notes
Date of Service: 07/21/2019    Logan Bright is a 84 y.o. male.       HPI     Logan Bright was seen today in the heart failure clinic for hospital follow-up. He was recently hospitalized from 6/3 to 07/04/2019 for heart failure. He presented to the emergency department with a new oxygen requirement and with complaints of malaise and dyspnea. He required 2 L of supplemental oxygen in the emergency department. A chest x-ray revealed concerns for fluid overload. He was admitted for decompensated heart failure. He received IV diuresis with improvement in his symptoms. At discharge, he was transitioned to Lasix 60 mg daily.  His weight on the day of discharge was 203 pounds.     His past medical history includes chronic combined heart failure, ischemic cardiomyopathy status post CRT-D, CAD status post PCI, history of VT status post VT ablation, paroxysmal atrial fibrillation with rhythm control with amiodarone and not on anticoagulation due to hematuria and syncope, hypertension, chronic kidney disease, hypothyroidism, and gout. He did have an echocardiogram completed on 07/03/2019 which revealed an LVEF of 15 to 20% with abnormal diastolic function, severely dilated RV with severely impaired function, severe biatrial dilatation, mild MR, mild AR, mild TR.    Today, he presents in the clinic and reports he has been doing fine since discharge.  He is unsure of why he is here today.  Upon reviewing his medications, he reports that he has only been taking 40 mg of Lasix despite the increase at the time of discharge, and he is unsure if he is taking potassium.  He does have someone setting up his pillbox, but unfortunately she is not here.  He does deny any increased dyspnea.  He does endorse lower extremity edema, but denies abdominal bloating.  He denies any dizziness or lightheadedness, and he also denies any chest pain.  He does have occasional heart palpitations.  He reports his appetite is so-so.  He does have his meals brought into him.  He is also sleeping so-so.    His friend, [name] Minor,sets up his pill boxes, and he asked that any medication changes be called to her. Her number is in his demographics.         Vitals:    07/21/19 0923   BP: (!) 149/80   BP Source: Arm, Right Upper   Patient Position: Sitting   Pulse: 92   Temp: 36 ?C (96.8 ?F)   TempSrc: Axillary   SpO2: 98%   Weight: 96.9 kg (213 lb 9.6 oz)   Height: 1.829 m (6')   PainSc: Zero     Body mass index is 28.97 kg/m?[name]     Past Medical History  Patient Active Problem List    Diagnosis Date Noted   ? Heart failure (HCC) 07/01/2019   ? Acute respiratory failure with hypoxia (HCC) 07/01/2019   ? History of polycythemia 12/14/2018   ? Hyponatremia 09/04/2018   ? Hypothyroidism 09/04/2018   ? Paroxysmal atrial fibrillation (HCC) 04/07/2018   ? 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 No hemodynamically significant common or internal carotid artery stenosis.     ? Closed trimalleolar fracture of right ankle 11/12/2015   ? Debility 06/10/2013   ? Recurrent ventricular tachycardia w/ 2 VT ablations 04/2013 05/11/2013  Sustained VT w/ multiple ICD tachypacing and Shocks, failed amiodaron   05/05/13 & 05/12/13  VT ablations [facility] w/ impella support 4/15, Home off Coreg and losartan d/t post procedure hematoma, hypotension     ? 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 Ermelinda Das.        ? Ischemic cardiomyopathy      6/02 - infarct 2 stents .  [facility]            4/04 - Surveillance stress test at [facility]: reportedly negative.  4/05 - Stress thallium study: EF 30%, LAD and RCA defects, elevated lung uptake.- Echo doppler: EF 25%, amt& inf hypokinesis,  apical akinesis, poss  thrombus, mild valvular Regurg       5/06 - Stress thallium: EF 22%, no signficant interval change.  08/2011 - Dobut echo.  EF 10-15%. Antero-apical akinesis/dyskinesis.  No ischemia.  04/2013 RegadenThall EF 23% Extensive Anterior infarct, no ischemia, unchanged from prior     ? Acute on chronic combined systolic (congestive) and diastolic (congestive) heart failure (HCC)       2003 - Status post ICD implant.    4/05 - EF 25-30% per stress thallium and echo doppler.  08/2011 - Echo EF 10-15%     ? Neurologic cardiac syncope        3/05 - Developed syncope while at restaurant, taken to ED.  Symptoms of diaphoresis                    and lightheadedness.          4/05 - Head up tilt study: positive for inducing hypotension 72/49. Produced symptoms                    c/w previous syncopal episode.     ? HTN (hypertension)                 2002 - Diagnosis established.     ? Dyslipidemia         09/26/2008  03/14/2009  02/26/2011  04/17/2012  03/31/2013   LDL 107 (H) 141 (H) 80 108 (H) 104 (H)   04/2013: Refuses Statins understands that they reduce recurrent CV events, still declines.      ? Gout    ? CKD (chronic kidney disease)      mild           Review of Systems   Constitution: Negative.   HENT: Negative.    Eyes: Negative.    Cardiovascular: Negative.    Respiratory: Negative.    Endocrine: Negative.    Hematologic/Lymphatic: Negative.    Skin: Negative.    Musculoskeletal: Negative.    Gastrointestinal: Negative.    Genitourinary: Negative.    Neurological: Negative.    Psychiatric/Behavioral: Negative.    Allergic/Immunologic: Negative.        Physical Exam  General Appearance: no distress  Skin: warm and dry  Digits and Nails: normal color, smooth symmetric nails and digits  Eyes: conjunctivae and lids normal  Neck Veins: JVP 6-7cm, +HJR sitting upright  Chest Inspection: left chest incision well healed  Auscultation/Percussion: breathing comfortably, lungs clear to auscultation, no rales or rhonchi, no wheezing  Cardiac Auscultation: Regular rhythm, S1, S2, no S3 or S4, no murmur  Carotid Arteries: normal carotid upstroke bilaterally, no bruit  Radial  Arteries: normal symmetric radial pulses  Abdominal Aorta: no abdominal aortic bruit  Pedal Pulses: pulses 2+, symmetric  Lower Extremity Edema: 2-3+BLE edema to distal of knees  Abdominal Exam: soft, non-tender,  bowel sounds normal, no hepatomegaly  Gait & Station: normal balance and gait  Muscle Strength: normal strength and tone  Orientation: clear historian, good insight       Problems Addressed Today  Encounter Diagnoses   Name Primary?   ? Acute on chronic combined systolic (congestive) and diastolic (congestive) heart failure (HCC) Yes   ? Ischemic cardiomyopathy    ? Paroxysmal atrial fibrillation (HCC)    ? Stage 3 chronic kidney disease, unspecified whether stage 3a or 3b CKD (HCC)    ? Essential (primary) hypertension         Assessment and Plan  1. Acute on chronic combined systolic (congestive) and diastolic (congestive) heart failure (HCC)   - NYHA class III, stage C symptoms  - GDMT:   BB: has been off betablockers due to hypotension  ACEI/ARB/ARNI:off losartan due to hypotension  AA:has not been on due to hyperkalemia   Hydralazine/isordil NA  - SGLT2: could be considered in the future; will defer to Dr. Barry Dienes  - Last echo: 07/03/2019: LVEF 15 to 20% with abnormal diastolic dysfunction, severely dilated RV with severely impaired function, severe biatrial dilatation, mild AR, mild MR, mild TR  - Device: CRT D in situ; device interrogation 07/01/2019:BiVP 99.8 % with OptiVol showing possible fluid accumulation  - Labs: BMP today  - Follow-up: 09/07/2019 with Dr. Barry Dienes     2. Ischemic cardiomyopathy   -Appears hypervolemic upon exam  -Asked him to increase his furosemide to 60 mg daily as prescribed at the time of discharge   3. Paroxysmal atrial fibrillation (HCC)   - not on anticoagulation due to hematuria and syncope   4. Stage 3 chronic kidney disease, unspecified whether stage 3a or 3b CKD (HCC)   -Creatinine trends between 1.6 and 1.8  -Check BMP today   5. Essential (primary) hypertension    -BP 149/80       Plan:  The following is a copy of the written instructions and plan I gave to the patient during his office visit.    Patient Instructions       Thank you for coming to [facility]. Your instructions today:     1.  Recommendations: INCREASE your lasix to 60mg  (1.5 tabs) daily as you were instructed to do following your hospitalization.    2.  Next follow up appointment in 09/07/2019 with Dr. Barry Dienes  3.  We will check your labs today to check your kidneys and electrolytes today.      For up to date information on the COVID-19 virus, visit the [facility] website.  ? General supportive care during cold and flu season and infection prevention reminders:   o Wash hands often with soap and water for at least 20 seconds  o Cover your mouth and nose  o Stay home if sick and symptoms mild or manageable  - If you must be around people wear a mask    ? If you are having symptoms of a lower respiratory infection (cough, shortness of breath) and/or fever AND either traveled in last 30 days (internationally or to region of exposure) OR known exposure to patient with COVID19:    o Call your primary care provider for questions or health needs.   - Tell your doctor about your recent  travel and your symptoms    o In a medical emergency, call 911 or go to the nearest emergency room.      If you wish to contact us, please call and leave a message for the heart failure nurses at [phone].   To schedule or change an appointment call [phone].    Renita Papa, MD, MSc  Ivory Broad, APRN  Elayne Snare, RN  [facility]  Phone: [phone]   Fax: [phone]  Scheduling: [phone]    Your Heart Failure Symptom Awareness and Action Plan  Every Day Action Plan  ? Weigh yourself in the morning before breakfast. Write it down and compare it to yesterday's weight.  ? Take your medicine, as prescribed. Please call if you have concerns about the side effects, cost or refills.  ? Check for worsened swelling in your feet, ankles and stomach  ? Follow a 2000mg  salt diet.  ? Keep all healthcare appointments    Green Zone   Good! Symptoms are under control ? No shortness of breath  ? No increase in ankle swelling  ? No weight gain  ? No chest pain  ? No change in your usual activity  ? Continue to follow your everyday action plan.    Yellow Zone  If you have any of these symptoms, please call the heart failure nurses: [phone] ? Increased shortness of breath with activity  ? Weight gain of 3 pounds in one day or 5 pounds in a week  ? Increased swelling in your ankles or legs  ? Increased swelling in your stomach  ? Increasing fatigue  ? You may need an adjustment of your medications.    Red Zone  These are urgent symptoms. Please call the heart failure nurses: [phone] ? Shortness of breath at rest or waking up at night feeling short of breath or coughing  ? Increased number of pillows used or needing to sit upright to sleep  ? Chest tightness at rest  ? Dizziness, lightheadedness or feeling faint You need to schedule an appointment   Emergency Zone ? call 911 ? Worsening chest tightness or pain that is not relieved by medication  ? Severe shortness of breath and a cough with pink, frothy sputum           Future Appointments   Date Time Provider Department Center   09/07/2019  2:00 PM Vanice Sarah, MD [facility] CVM Exam   01/17/2020 10:00 AM Juliann Pares, DO [facility] IM           Education/counseling time spent: 25 minutes of 35 minute visit  Topics:  HF disease process, Treatment options, Treatment risks and benefits, Medication instructions, Medication interactions, Daily weight monitoring, Sodium and fluid restriction,  Knows HF symptoms, Knows when to call, Knows who to call, Knows next appt.           Current Medications (including today's revisions)  ? allopurinoL (ZYLOPRIM) 100 mg tablet Take one tablet by mouth at bedtime daily. Take with food.   ? amiodarone (CORDARONE) 200 mg tablet Take one tablet by mouth daily. Take with food.   ? aspirin EC 81 mg tablet Take 81 mg by mouth daily. Take with food.   ? ergocalciferol (VITAMIN D-2) 1,250 mcg (50,000 unit) capsule TAKE 1 CAPSULE BY MOUTH 1 TIME A WEEK   ? furosemide (LASIX) 40 mg tablet Take 1.5 tablets by mouth daily.   ? levothyroxine (SYNTHROID)  50 mcg tablet Take one tablet by mouth daily 30 minutes before breakfast.   ? potassium chloride (KLOR-CON) 10 mEq tablet Take one tablet by mouth daily. Take with a meal and a full glass of water.   ? STIMULANT LAXATIVE PLUS 8.6-50 mg tablet Take 1 tablet by mouth twice daily (at 10AM and 10PM).

## 2019-07-21 NOTE — Telephone Encounter
Bright, Logan Dane, APRN-NP  P Cvm Nurse Hf Team [place]   Labs stable. ?Can we please call this gentleman's friend, Logan Bright, whose number is in the chart and tell her to increase his lasix to 60mg  daily? ?She sets up his pillbox. ?I wrote it on the AVS, but I am not sure she will read that as the 60mg  dose was what he was supposed to discharge home on from the hospital and it got missed.         Called pt's friend, Logan Bright Minor listed in pt's chart with above information from Ivory Broad, APRN. I reiterated medication change to INCREASE to Lasix 60 mg (1.5 tablet) daily. I asked for CB with questions or concerns.

## 2019-07-28 ENCOUNTER — Encounter
Admit: 2019-07-28 | Discharge: 2019-07-28 | Payer: MEDICARE | Primary: Student in an Organized Health Care Education/Training Program

## 2019-07-28 NOTE — Telephone Encounter
Pt states that last year from Aug - Oct he had covitis.  This nurse called back for clarification.  Pt confirmed he meant covid.  Stated he has fully healed and immune system has recovered and wanted to know if pcp can write a letter, or suggest what needs to be done, so pt can be allowed to travel.  Routing to advise.  CB Nadiya Pieratt LPN

## 2019-07-29 NOTE — Telephone Encounter
Please let the patient know:  Some airlines and countries are requiring proof of negative testing, and others are not.  If he needs this, we are happy to order, or he can get tested in the community.  I have not been asked to write a letter for this purpose in the past.

## 2019-09-07 ENCOUNTER — Encounter
Admit: 2019-09-07 | Discharge: 2019-09-07 | Payer: MEDICARE | Primary: Student in an Organized Health Care Education/Training Program

## 2019-09-07 DIAGNOSIS — I255 Ischemic cardiomyopathy: Secondary | ICD-10-CM

## 2019-09-07 DIAGNOSIS — N289 Disorder of kidney and ureter, unspecified: Secondary | ICD-10-CM

## 2019-09-07 DIAGNOSIS — Z955 Presence of coronary angioplasty implant and graft: Secondary | ICD-10-CM

## 2019-09-07 DIAGNOSIS — I2510 Atherosclerotic heart disease of native coronary artery without angina pectoris: Secondary | ICD-10-CM

## 2019-09-07 DIAGNOSIS — R55 Syncope and collapse: Secondary | ICD-10-CM

## 2019-09-07 DIAGNOSIS — Z95810 Presence of automatic (implantable) cardiac defibrillator: Secondary | ICD-10-CM

## 2019-09-07 DIAGNOSIS — I5043 Acute on chronic combined systolic (congestive) and diastolic (congestive) heart failure: Secondary | ICD-10-CM

## 2019-09-07 DIAGNOSIS — I10 Essential (primary) hypertension: Secondary | ICD-10-CM

## 2019-09-07 DIAGNOSIS — I480 Paroxysmal atrial fibrillation: Secondary | ICD-10-CM

## 2019-09-07 DIAGNOSIS — M1990 Unspecified osteoarthritis, unspecified site: Secondary | ICD-10-CM

## 2019-09-07 DIAGNOSIS — E785 Hyperlipidemia, unspecified: Secondary | ICD-10-CM

## 2019-09-07 DIAGNOSIS — Z8679 Personal history of other diseases of the circulatory system: Secondary | ICD-10-CM

## 2019-09-07 DIAGNOSIS — T148XXA Other injury of unspecified body region, initial encounter: Secondary | ICD-10-CM

## 2019-09-07 NOTE — Assessment & Plan Note
He is stable from the standpoint of heart failure and has class II symptoms currently.

## 2019-09-07 NOTE — Progress Notes
Date of Service: 09/07/2019    Logan Bright is a 84 y.o. male.       HPI     Logan Bright was in the [place] clinic today for follow-up regarding heart failure and an ICD.  He was accompanied by a young woman who works in [employer].  The owner of the restaurant has really taken Logan Bright under her wing and takes great care of him.    He really seems to be stable from a cardiac standpoint.  He denies any recent problems with breathlessness or palpitations.  He has had no syncope for nearly a year and he has had no difficulty with significant fluid retention.  He generally has mild pedal edema at the end of the day but it goes down during the night.    He sleeps pretty good and his weight is fairly stable.         Vitals:    09/07/19 1359 09/07/19 1409   BP: (!) 140/88 138/80   BP Source: Arm, Left Upper Arm, Right Upper   Patient Position: Sitting Sitting   Pulse: 82    SpO2: 97%    Weight: 94.1 kg (207 lb 6.4 oz)    Height: 1.829 m (6')    PainSc: Zero      Body mass index is 28.13 kg/m?[name]     Past Medical History  Patient Active Problem List    Diagnosis Date Noted   ? Heart failure (HCC) 07/01/2019   ? Acute respiratory failure with hypoxia (HCC) 07/01/2019   ? History of polycythemia 12/14/2018   ? Hyponatremia 09/04/2018   ? Hypothyroidism 09/04/2018   ? Paroxysmal atrial fibrillation (HCC) 04/07/2018   ? 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 No hemodynamically significant common or internal carotid artery stenosis.     ? Closed trimalleolar fracture of right ankle 11/12/2015   ? Debility 06/10/2013   ? Recurrent ventricular tachycardia w/ 2 VT ablations 04/2013 05/11/2013     Sustained VT w/ multiple ICD tachypacing and Shocks, failed amiodaron   05/05/13 & 05/12/13  VT ablations [facility] w/ impella support 4/15, Home off Coreg and losartan d/t post procedure hematoma, hypotension     ? 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 Ermelinda Das.        ? Ischemic cardiomyopathy      6/02 - infarct 2 stents .  [facility]            4/04 - Surveillance stress test at [facility]: reportedly negative.  4/05 - Stress thallium study: EF 30%, LAD and RCA defects, elevated lung uptake.- Echo doppler: EF 25%, amt& inf hypokinesis,  apical akinesis, poss  thrombus, mild valvular Regurg       5/06 - Stress thallium: EF 22%, no signficant interval change.  08/2011 - Dobut echo.  EF 10-15%. Antero-apical akinesis/dyskinesis.  No ischemia.  04/2013 RegadenThall EF 23% Extensive Anterior infarct, no ischemia, unchanged from prior     ? Acute on chronic combined systolic (congestive) and diastolic (congestive) heart failure (HCC)       2003 - Status post ICD implant.    4/05 - EF 25-30%  per stress thallium and echo doppler.  08/2011 - Echo EF 10-15%     ? Neurologic cardiac syncope        3/05 - Developed syncope while at restaurant, taken to ED.  Symptoms of diaphoresis                    and lightheadedness.          4/05 - Head up tilt study: positive for inducing hypotension 72/49. Produced symptoms                    c/w previous syncopal episode.     ? HTN (hypertension)                 2002 - Diagnosis established.     ? Dyslipidemia         09/26/2008  03/14/2009  02/26/2011  04/17/2012  03/31/2013   LDL 107 (H) 141 (H) 80 108 (H) 104 (H)   04/2013: Refuses Statins understands that they reduce recurrent CV events, still declines.      ? Gout    ? CKD (chronic kidney disease)      mild           Review of Systems   Constitution: Negative.   HENT: Negative.    Eyes: Negative.    Cardiovascular: Positive for irregular heartbeat.   Respiratory: Negative.    Endocrine: Negative. Hematologic/Lymphatic: Negative.    Skin: Negative.    Musculoskeletal: Negative.    Gastrointestinal: Negative.    Genitourinary: Negative.    Neurological: Negative.    Psychiatric/Behavioral: Negative.    Allergic/Immunologic: Negative.        Physical Exam    Physical Exam   General Appearance: no distress   Skin: warm, no ulcers or xanthomas   Digits and Nails: no cyanosis or clubbing   Eyes: conjunctivae and lids normal, pupils are equal and round   Teeth/Gums/Palate: dentition unremarkable, no lesions   Lips & Oral Mucosa: no pallor or cyanosis   Neck Veins: normal JVP , neck veins are not distended   Thyroid: no nodules, masses, tenderness or enlargement   Chest Inspection: chest is normal in appearance   Respiratory Effort: breathing comfortably, no respiratory distress   Auscultation/Percussion: lungs clear to auscultation, no rales or rhonchi, no wheezing   PMI: PMI not enlarged or displaced   Cardiac Rhythm: regular rhythm and normal rate   Cardiac Auscultation: S1, S2 normal, no rub, no gallop   Murmurs: no murmur   Peripheral Circulation: normal peripheral circulation   Carotid Arteries: normal carotid upstroke bilaterally, no bruits   Radial Arteries: normal symmetric radial pulses   Abdominal Aorta: no abdominal aortic bruit   Pedal Pulses: normal symmetric pedal pulses   Lower Extremity Edema: no lower extremity edema   Abdominal Exam: soft, non-tender, no masses, bowel sounds normal   Liver & Spleen: no organomegaly   Gait & Station: walks without assistance   Muscle Strength: normal muscle tone   Orientation: oriented to time, place and person   Affect & Mood: appropriate and sustained affect   Language and Memory: patient responsive and seems to comprehend information   Neurologic Exam: neurological assessment grossly intact   Other: moves all extremities      Problems Addressed Today  Encounter Diagnoses   Name Primary?   ? Acute on chronic combined systolic (congestive) and diastolic (congestive) heart failure (HCC)    ? Essential hypertension    ?  Paroxysmal atrial fibrillation (HCC)    ? Automatic implantable cardioverter-defibrillator in situ        Assessment and Plan       Acute on chronic combined systolic (congestive) and diastolic (congestive) heart failure (HCC)  He is stable from the standpoint of heart failure and has class II symptoms currently.    HTN (hypertension)  His blood pressure seems fine on the current medical program.  The goal for him is an average of 140/80 or less.  We need to be careful not to let it get too low because he does have a history of autonomic dysfunction and vagal hypersensitivity.    Paroxysmal atrial fibrillation (HCC)  His recent device check shows no atrial fibrillation.    Automatic implantable cardioverter-defibrillator in situ  No recent treatments.      Current Medications (including today's revisions)  ? allopurinoL (ZYLOPRIM) 100 mg tablet Take one tablet by mouth at bedtime daily. Take with food.   ? amiodarone (CORDARONE) 200 mg tablet Take one tablet by mouth daily. Take with food.   ? aspirin EC 81 mg tablet Take 81 mg by mouth daily. Take with food.   ? ergocalciferol (VITAMIN D-2) 1,250 mcg (50,000 unit) capsule TAKE 1 CAPSULE BY MOUTH 1 TIME A WEEK   ? furosemide (LASIX) 40 mg tablet Take 1.5 tablets by mouth daily.   ? levothyroxine (SYNTHROID) 50 mcg tablet Take one tablet by mouth daily 30 minutes before breakfast.   ? potassium chloride (KLOR-CON) 10 mEq tablet Take one tablet by mouth daily. Take with a meal and a full glass of water.   ? STIMULANT LAXATIVE PLUS 8.6-50 mg tablet Take 1 tablet by mouth twice daily (at 10AM and 10PM).     Total time spent on today's office visit was 30 minutes.  This includes face-to-face in person visit with patient as well as nonface-to-face time including review of the EMR, outside records, labs, radiologic studies, echocardiogram & other cardiovascular studies, formation of treatment plan, after visit summary, future disposition, and lastly on documentation.

## 2019-09-07 NOTE — Assessment & Plan Note
His blood pressure seems fine on the current medical program.  The goal for him is an average of 140/80 or less.  We need to be careful not to let it get too low because he does have a history of autonomic dysfunction and vagal hypersensitivity.

## 2019-09-07 NOTE — Assessment & Plan Note
No recent treatments.

## 2019-09-07 NOTE — Assessment & Plan Note
His recent device check shows no atrial fibrillation.

## 2019-09-22 ENCOUNTER — Encounter
Admit: 2019-09-22 | Discharge: 2019-09-22 | Payer: MEDICARE | Primary: Student in an Organized Health Care Education/Training Program

## 2019-09-23 ENCOUNTER — Encounter
Admit: 2019-09-23 | Discharge: 2019-09-23 | Payer: MEDICARE | Primary: Student in an Organized Health Care Education/Training Program

## 2019-09-23 MED ORDER — FUROSEMIDE 40 MG PO TAB
60 mg | ORAL_TABLET | Freq: Every day | ORAL | 3 refills | 90.00000 days | Status: AC
Start: 2019-09-23 — End: ?

## 2019-10-04 ENCOUNTER — Emergency Department: Admit: 2019-10-04 | Discharge: 2019-10-04 | Payer: MEDICARE

## 2019-10-04 ENCOUNTER — Encounter
Admit: 2019-10-04 | Discharge: 2019-10-04 | Payer: MEDICARE | Primary: Student in an Organized Health Care Education/Training Program

## 2019-10-04 DIAGNOSIS — I10 Essential (primary) hypertension: Secondary | ICD-10-CM

## 2019-10-04 DIAGNOSIS — R55 Syncope and collapse: Secondary | ICD-10-CM

## 2019-10-04 DIAGNOSIS — R7989 Other specified abnormal findings of blood chemistry: Secondary | ICD-10-CM

## 2019-10-04 DIAGNOSIS — I509 Heart failure, unspecified: Secondary | ICD-10-CM

## 2019-10-05 ENCOUNTER — Ambulatory Visit
Admit: 2019-10-05 | Discharge: 2019-10-05 | Payer: MEDICARE | Primary: Student in an Organized Health Care Education/Training Program

## 2019-10-05 ENCOUNTER — Encounter
Admit: 2019-10-05 | Discharge: 2019-10-05 | Payer: MEDICARE | Primary: Student in an Organized Health Care Education/Training Program

## 2019-10-05 DIAGNOSIS — Z95810 Presence of automatic (implantable) cardiac defibrillator: Secondary | ICD-10-CM

## 2019-10-05 NOTE — ED Notes
Medtr[employer] report received and placed on Dr. Nichola Sizer desk.

## 2019-10-05 NOTE — ED Notes
Orthostatic vital performed - negative. Pt denied dizziness and stood steadily. Dr. Darene Lamer at bedside.     Supine:  BP: 151/86 (106)  RR: 21  HR: 63  O2: 95%    Standing:  BP: 146/85 (104)  RR: 30  HR: 63  O2: 96%

## 2019-10-05 NOTE — ED Notes
Interrogated Medtronic defibrillator. [employer] called and stated the Pt's device is working as it should. Will send over the report.

## 2019-10-05 NOTE — ED Notes
Informed Dr. Darene Lamer and Dr. Eldridge Abrahams that Pt's POC Trop is 0.03 and POC BNP is 1769.

## 2019-10-07 ENCOUNTER — Encounter
Admit: 2019-10-07 | Discharge: 2019-10-07 | Payer: MEDICARE | Primary: Student in an Organized Health Care Education/Training Program

## 2019-10-07 NOTE — Telephone Encounter
Received a call from patient's DPOA, [name] stating that Monday patient had gotten very lightheaded and fell. She states that the patient proceeded to call the ambulance after not feeling well. She states that he was nauseated and vomiting. She tried to take his BP and their home machine wouldn't read his pressure. She states once the ambulance arrived they stated that his BP was elevated but didn't state how high. They recommended taking patient to the ER and patient refused. [name] states that the patient has these dizzy spells quite often and she is becoming concerned. She states that the paramedics also stated that patient is fluid overloaded, which [name] also agreed with. She stated that she has noticed patient has more swelling than usual lately.     [name] also brought to our attention that she doesn't think [name] is taking all of his medications. She states that she fills a pill box for him for every day of the week and she's noticing more often that pills are being left behind. [name] states that when she asks [name] whether he takes his medications or not he responds I don't know. [name] thinks that patient's memory is starting to worsen which is worrisome for her since she can't be there with him all of the time.    Recommended if patient has another episode that she should urge patient to go to the ER for medical treatment.     Will route to [facility] for recommendations.

## 2019-10-08 ENCOUNTER — Encounter
Admit: 2019-10-08 | Discharge: 2019-10-08 | Payer: MEDICARE | Primary: Student in an Organized Health Care Education/Training Program

## 2019-10-08 NOTE — Telephone Encounter
ED Discharge Follow Up  Reached patient: Yes; spoke with [name]  Admission Information  Hospital Name : [facility]  ED Admission Date: 10/04/19 ED Discharge Date: 10/05/19 Admission Diagnosis: Fall  Discharge Diagnosis   Pre-syncope   Elevated brain natriuretic peptide (BNP) level   Essential hypertension   Heart failure, unspecified HF chronicity, unspecified heart failure type [facility])   Hospital Services: Unplanned  Today's call is 3 (calendar) days post discharge    Discharge Instruction Review  Did patient receive and understand discharge instructions? Yes  Are there concerns regarding the patient?s ADL?s ? No    Medication Reconciliation  Changes to pre-ED visit medications? Yes, increased Lasix to 80mg  daily  Were new prescriptions filled?N/A  Meds reviewed and reconciled? Yes  ? allopurinoL (ZYLOPRIM) 100 mg tablet Take one tablet by mouth at bedtime daily. Take with food.   ? amiodarone (CORDARONE) 200 mg tablet Take one tablet by mouth daily. Take with food.   ? aspirin EC 81 mg tablet Take 81 mg by mouth daily. Take with food.   ? ergocalciferol (VITAMIN D-2) 1,250 mcg (50,000 unit) capsule TAKE 1 CAPSULE BY MOUTH 1 TIME A WEEK   ? furosemide (LASIX) 40 mg tablet Take 1.5 tablets by mouth daily.   ? levothyroxine (SYNTHROID) 50 mcg tablet Take one tablet by mouth daily 30 minutes before breakfast.   ? potassium chloride (KLOR-CON) 10 mEq tablet Take one tablet by mouth daily. Take with a meal and a full glass of water.   ? STIMULANT LAXATIVE PLUS 8.6-50 mg tablet Take 1 tablet by mouth twice daily (at 10AM and 10PM).         Understanding Condition  Having any current symptoms? Yes, Edema/swelling; [name] states he is feeling a little better today.  She states she spoke with Cardiology yesterday and is waiting for recommendations. He continues to have extremity swelling, and she states she increased his lasix to 80mg  until she hears from Dr. Barry Dienes, Cardiology.  She states he has a little SOA, and reports increased urination.  She would like to hear from Cardiology first before she schedules with PCP office.  Patient understands when to seek additional medical care? Yes  Is patient aware of [facility] locations?Yes  Urgent Care appropriate for this diagnosis? No  Other instructions provided:     Scheduling Follow-up Appointment  Upcoming appointment date and time and with whom scheduled:   Future Appointments   Date Time Provider Department Center   01/17/2020 10:00 AM Juliann Pares, DO [facility] IM   03/09/2020  2:00 PM [facility] CVM Procedur   03/09/2020  2:30 PM Vanice Sarah, MD [facility] C[facility] Exam     When was patient?s last PCP visit: 07/06/2019  PCP primary location: [facility]  PCP appointment scheduled? No, routine appt 01/17/2020  Specialist appointment scheduled? No  Both PCP and Specialist appointment scheduled: No  Is assistance with transportation needed? No  MyChart message sent? Inactive in MyChart.    ED Communication   Did Pt call Clinic prior to going to ED? No  Reason patient went to ED: Fear of having a critical medical condition    Lavell Islam, [place]

## 2019-10-19 ENCOUNTER — Encounter
Admit: 2019-10-19 | Discharge: 2019-10-19 | Payer: MEDICARE | Primary: Student in an Organized Health Care Education/Training Program

## 2019-10-27 ENCOUNTER — Encounter
Admit: 2019-10-27 | Discharge: 2019-10-27 | Payer: MEDICARE | Primary: Student in an Organized Health Care Education/Training Program

## 2019-10-27 ENCOUNTER — Observation Stay
Admit: 2019-10-27 | Discharge: 2019-10-27 | Payer: MEDICARE | Primary: Student in an Organized Health Care Education/Training Program

## 2019-10-27 ENCOUNTER — Emergency Department: Admit: 2019-10-27 | Discharge: 2019-10-27 | Payer: MEDICARE

## 2019-10-27 DIAGNOSIS — I5043 Acute on chronic combined systolic (congestive) and diastolic (congestive) heart failure: Secondary | ICD-10-CM

## 2019-10-27 DIAGNOSIS — N289 Disorder of kidney and ureter, unspecified: Secondary | ICD-10-CM

## 2019-10-27 DIAGNOSIS — Z95810 Presence of automatic (implantable) cardiac defibrillator: Secondary | ICD-10-CM

## 2019-10-27 DIAGNOSIS — I480 Paroxysmal atrial fibrillation: Secondary | ICD-10-CM

## 2019-10-27 DIAGNOSIS — I2510 Atherosclerotic heart disease of native coronary artery without angina pectoris: Secondary | ICD-10-CM

## 2019-10-27 DIAGNOSIS — M1990 Unspecified osteoarthritis, unspecified site: Secondary | ICD-10-CM

## 2019-10-27 DIAGNOSIS — N183 Stage 3 chronic kidney disease, unspecified whether stage 3a or 3b CKD (HCC): Secondary | ICD-10-CM

## 2019-10-27 DIAGNOSIS — E785 Hyperlipidemia, unspecified: Secondary | ICD-10-CM

## 2019-10-27 DIAGNOSIS — I509 Heart failure, unspecified: Secondary | ICD-10-CM

## 2019-10-27 DIAGNOSIS — R55 Syncope and collapse: Secondary | ICD-10-CM

## 2019-10-27 DIAGNOSIS — T148XXA Other injury of unspecified body region, initial encounter: Secondary | ICD-10-CM

## 2019-10-27 DIAGNOSIS — I10 Essential (primary) hypertension: Secondary | ICD-10-CM

## 2019-10-27 DIAGNOSIS — I255 Ischemic cardiomyopathy: Secondary | ICD-10-CM

## 2019-10-27 DIAGNOSIS — Z8679 Personal history of other diseases of the circulatory system: Secondary | ICD-10-CM

## 2019-10-27 DIAGNOSIS — Z955 Presence of coronary angioplasty implant and graft: Secondary | ICD-10-CM

## 2019-10-27 LAB — URINALYSIS DIPSTICK REFLEX TO CULTURE
Lab: NEGATIVE MMOL/L (ref 21–30)
Lab: NEGATIVE U/L (ref 7–56)
Lab: NEGATIVE mL/min — ABNORMAL LOW (ref 0–0.45)

## 2019-10-27 LAB — URINALYSIS MICROSCOPIC REFLEX TO CULTURE

## 2019-10-27 LAB — COMPREHENSIVE METABOLIC PANEL
Lab: 125 mg/dL — ABNORMAL HIGH (ref 70–100)
Lab: 136 MMOL/L — ABNORMAL LOW (ref 137–147)
Lab: 33 mg/dL — ABNORMAL HIGH (ref 7–25)
Lab: 98 MMOL/L (ref 98–110)

## 2019-10-27 LAB — CBC AND DIFF
Lab: 4.7 K/UL (ref 4.5–11.0)
Lab: 5.4 M/UL (ref 4.4–5.5)
Lab: 19 (ref <20.7)

## 2019-10-27 LAB — POC TROPONIN: Lab: 0 ng/mL (ref 0.00–0.05)

## 2019-10-27 LAB — TSH WITH FREE T4 REFLEX: Lab: 4.4 uU/mL (ref 0.35–5.00)

## 2019-10-27 LAB — MAGNESIUM: Lab: 2.4 mg/dL (ref 1.6–2.6)

## 2019-10-27 LAB — LACTIC ACID(LACTATE): Lab: 1.4 MMOL/L (ref 0.5–2.0)

## 2019-10-27 LAB — COVID-19 (SARS-COV-2) PCR

## 2019-10-27 LAB — BNP POC ER: Lab: 720 pg/mL — ABNORMAL HIGH (ref 0–100)

## 2019-10-27 MED ORDER — HEPARIN, PORCINE (PF) 5,000 UNIT/0.5 ML IJ SYRG
5000 [IU] | SUBCUTANEOUS | 0 refills | Status: AC
Start: 2019-10-27 — End: ?
  Administered 2019-10-28 – 2019-10-29 (×3): 5000 [IU] via SUBCUTANEOUS

## 2019-10-27 MED ORDER — ALLOPURINOL 100 MG PO TAB
100 mg | Freq: Every evening | ORAL | 0 refills | Status: AC
Start: 2019-10-27 — End: ?
  Administered 2019-10-28 – 2019-10-29 (×2): 100 mg via ORAL

## 2019-10-27 MED ORDER — IMS MIXTURE TEMPLATE
60 mg | Freq: Every day | ORAL | 0 refills | Status: AC
Start: 2019-10-27 — End: ?
  Administered 2019-10-28 (×2): 60 mg via ORAL

## 2019-10-27 MED ORDER — SENNOSIDES-DOCUSATE SODIUM 8.6-50 MG PO TAB
1 | Freq: Two times a day (BID) | ORAL | 0 refills | Status: AC
Start: 2019-10-27 — End: ?
  Administered 2019-10-28 – 2019-10-29 (×4): 1 via ORAL

## 2019-10-27 MED ORDER — ERGOCALCIFEROL (VITAMIN D2) 1,250 MCG (50,000 UNIT) PO CAP
50000 [IU] | ORAL | 0 refills | Status: AC
Start: 2019-10-27 — End: ?

## 2019-10-27 MED ORDER — DOCUSATE SODIUM 100 MG PO CAP
100 mg | Freq: Two times a day (BID) | ORAL | 0 refills | Status: AC | PRN
Start: 2019-10-27 — End: ?

## 2019-10-27 MED ORDER — ACETAMINOPHEN 325 MG PO TAB
650 mg | ORAL | 0 refills | Status: AC | PRN
Start: 2019-10-27 — End: ?

## 2019-10-27 MED ORDER — ONDANSETRON 4 MG PO TBDI
4 mg | ORAL | 0 refills | Status: AC | PRN
Start: 2019-10-27 — End: ?

## 2019-10-27 MED ORDER — POTASSIUM CHLORIDE 20 MEQ PO TBTQ
40 meq | Freq: Once | ORAL | 0 refills | Status: CP
Start: 2019-10-27 — End: ?
  Administered 2019-10-27: 19:00:00 40 meq via ORAL

## 2019-10-27 MED ORDER — FUROSEMIDE 10 MG/ML IJ SOLN
80 mg | Freq: Once | INTRAVENOUS | 0 refills | Status: CP
Start: 2019-10-27 — End: ?
  Administered 2019-10-27: 17:00:00 80 mg via INTRAVENOUS

## 2019-10-27 MED ORDER — AMIODARONE 200 MG PO TAB
200 mg | Freq: Every day | ORAL | 0 refills | Status: AC
Start: 2019-10-27 — End: ?
  Administered 2019-10-27 – 2019-10-29 (×3): 200 mg via ORAL

## 2019-10-27 MED ORDER — ASPIRIN 81 MG PO TBEC
81 mg | Freq: Every day | ORAL | 0 refills | Status: AC
Start: 2019-10-27 — End: ?
  Administered 2019-10-27 – 2019-10-29 (×3): 81 mg via ORAL

## 2019-10-27 MED ORDER — BISACODYL 10 MG RE SUPP
10 mg | Freq: Every day | RECTAL | 0 refills | Status: AC | PRN
Start: 2019-10-27 — End: ?

## 2019-10-27 MED ORDER — METOPROLOL SUCCINATE 25 MG PO TB24
12.5 mg | Freq: Every day | ORAL | 0 refills | Status: AC
Start: 2019-10-27 — End: ?
  Administered 2019-10-27 – 2019-10-29 (×3): 12.5 mg via ORAL

## 2019-10-27 MED ORDER — LEVOTHYROXINE 50 MCG PO TAB
50 ug | Freq: Every day | ORAL | 0 refills | Status: AC
Start: 2019-10-27 — End: ?
  Administered 2019-10-28 – 2019-10-29 (×2): 50 ug via ORAL

## 2019-10-27 MED ORDER — MELATONIN 3 MG PO TAB
3 mg | Freq: Every evening | ORAL | 0 refills | Status: AC | PRN
Start: 2019-10-27 — End: ?
  Administered 2019-10-29: 02:00:00 3 mg via ORAL

## 2019-10-27 MED ORDER — ONDANSETRON HCL (PF) 4 MG/2 ML IJ SOLN
4 mg | INTRAVENOUS | 0 refills | Status: AC | PRN
Start: 2019-10-27 — End: ?

## 2019-10-27 NOTE — H&P (View-Only)
Admission History and Physical Examination      Name:  Logan Bright                                             MRN:  [id number]   Admission Date:  10/27/2019                     Assessment/Plan:    Active Problems:    Ischemic cardiomyopathy    Acute on chronic combined systolic (congestive) and diastolic (congestive) heart failure (HCC)    HTN (hypertension)    Dyslipidemia    Automatic implantable cardioverter-defibrillator in situ    Recurrent ventricular tachycardia w/ 2 VT ablations 04/2013    Debility    Paroxysmal atrial fibrillation [facility])    Hypothyroidism    84 year old male patient with a medical history of?paroxysmal atrial fibrillation?(not?on anticoagulation), neurocardiogenic syncope, HTN, HLD, CKD, gout, chronic combined CHF status post ICD, history of VT status post VT ablation, CAD status post stent placement?who presents to ED with complaints of shortness of breath and was admitted because of concern for mild acute on chronic congestive heart failure exacerbation.  There was also concern for some confusion however at the time my interview, patient was oriented x3.  ?  #Acute on Chronic Combined Heart Failure Exacerbation  #Ischemic Cardiomyopathy s/p CRT-D  #CAD s/p PCI  #Hx VT s/p VT ablation  - Follows with Dr. Barry Dienes in [facility] Cardiology clinic  - Troponin negative, EKG at baseline?with AV paced complexes  - 2-D ECHO 07/03/2019:?Shows EF of 15 to 20%, severely impaired left ventricular function, left ventricular diastolic volume index of 118, abnormal diastolic function.  Regional wall motion abnormality noted.  Severe biatrial enlargement.  Right ventricle is severely dilated with severely impaired systolic function.  Mild aortic valve regurgitation.  Mild mitral valve regurgitation.  Mild tricuspid valve regurgitation.  Right atrial pressure was greater than 15.  Aortic root at 4.4 cm at the sinus of Valsalva.  Proximal ascending artery is dilated measuring 4.1 cm.  - PTA GDMT: none- unable to tolerate due to hypotension and vagal syncopal episodes  - PTA Diuretic: Furosemide 60mg  daily  - on PTA ASA 81, has refused statins  - BNP on admit 720  -Patient received 80 mg of IV Lasix in the ER.    >Heart failure physician consult.  > Continue daily ASA  > Resume PTA Lasix of 60 mg p.o. daily from a.m.  > Replace KCl with Lasix administration in ER.  > Hold PTA daily KCl, replace as needed.  > Interrogation of device ordered.  ?  #Paroxysmal Atrial Fibrillation  - on PTA rhythm control with amiodarone  - previously on low dose eliquis, however, stopped for hematuria and syncope  > Continue amiodarone  ?  #Hypertension  - not on any PTA's due to hypotension/syncopal episodes in setting of known autonomic dysfunction   -Blood pressure in 150-160s in the ER.   > Start low-dose metoprolol XL 12.5 mg p.o. daily.  ?  #AKI on CKD stage III  - Admission Cr 1.82, near baseline ~1.4-1.6.   > Monitor renal function with diuresis received in the ER today.  > Daily BMP.  ?  #Hypothyroidism  >Continue PTA synthroid 50 mcg  ?  #Gout  > Continue PTA allopurinol    #Some concern for confusion, unsure if this  was related to excessive sleepiness.  Check lactate.  Patient awake alert oriented x3 at time of my interview.    FEN: No IV fluid, KCl 40, cardiac diet with 1.5 L fluid restriction    Prophylaxis: Subcutaneous heparin    Disposition  -Admit to medicine for observation care    CODE STATUS:DNAR- FI: In full intervention only wants vasopressor use and transfer to ICU if needed, does not want BiPAP, cardioversion or intubation.    Complexity of decision making high.  __________________________________________________________________________________  Primary Care Physician: Juliann Pares  Verified    Chief Complaint: Shortness of breath.  History of Present Illness: Logan Bright is a 84 y.o. male with a medical history of?paroxysmal atrial fibrillation?(not?on anticoagulation), neurocardiogenic syncope, HTN, HLD, CKD, gout, chronic combined CHF status post ICD, history of VT status post VT ablation, CAD status post stent placement?who presents to ED with complaints of shortness of breath. ?Patient to me denied any further shortness of breath, denies any significant dyspnea on exertion, could not say how much he could walk without getting short of breath clearly, denies any chest pain or palpitation.  Denies any syncope or near syncope.  Denies any abdominal distention.  He has chronic lower extremity edema and feels his legs are swollen.  Denies any urinary complaints.  Denies any cough or phlegm production.  Denies any fever chills.  Denies any headache lightheadedness or dizziness.    As per nursing note, patient reported to ER that he has been having increasing shortness of breath the past the past few weeks, with rest as well as with activity.  Patient reported that his family came over and knocked on the door this morning and he did not wake up with the noise.  Patient reported that the police then came over and decided he needs to go to the hospital.  Patient denies missing any medication.  Reported he drinks approximately 1.5 L to 2 L of fluid a day.    Medical History:   Diagnosis Date   ? Arthritis    ? CAD (coronary artery disease)    ? Dyslipidemia    ? Fracture     RT ANKLE   ? History of ventricular tachycardia    ? HTN (hypertension)    ? ICD (implantable cardiac defibrillator) in place    ? Ischemic cardiomyopathy    ? Neurologic cardiac syncope    ? Renal insufficiency     mild   ? S/P coronary artery stent placement      Surgical History:   Procedure Laterality Date   ? EXTERNAL FIXATION APPLICATION ANKLE Right 11/13/2015    Performed by Azzie Glatter, MD at [facility] OR   ? EXTERNAL FIXATION REMOVAL RIGHT ANKLE Right 12/08/2015    Performed by Azzie Glatter, MD at [facility] OR   ? OPEN REDUCTION INTERNAL FIXATION RIGHT ANKLE Right 12/08/2015    Performed by Azzie Glatter, MD at [facility] OR   ? INSERTION LEAD LEFT VENTRICLE AT TIME OF INSERTION OF PACEMAKER/ IMPLANTABLE DEFIBRILLATOR Left 09/09/2018    Performed by Annamarie Dawley, MD at [facility] EP LAB   ? REMOVAL AND REPLACEMENT PERMANENT PACEMAKER GENERATOR  - MULTIPLE LEAD SYSTEM Left 09/09/2018    Performed by Annamarie Dawley, MD at [facility] EP LAB   ? CARDIAC DEFIBRILLATOR PLACEMENT     ? HX HEART CATHETERIZATION     ? HX ICD PLACEMENT     ? HX PACEMAKER PLACEMENT  Family history reviewed; non-contributory  Social History     Socioeconomic History   ? Marital status: Widowed     Spouse name: Not on file   ? Number of children: Not on file   ? Years of education: Not on file   ? Highest education level: Not on file   Occupational History   ? Not on file   Tobacco Use   ? Smoking status: Never Smoker   ? Smokeless tobacco: Never Used   Vaping Use   ? Vaping Use: Never used   Substance and Sexual Activity   ? Alcohol use: No   ? Drug use: No   ? Sexual activity: Not on file   Other Topics Concern   ? Not on file   Social History Narrative   ? Not on file     Social Determinants of Health     Financial Resource Strain:    ? Difficulty of Paying Living Expenses:    Food Insecurity:    ? Worried About Programme researcher, broadcasting/film/video in the Last Year:    ? Barista in the Last Year:    Transportation Needs:    ? Freight forwarder (Medical):    ? Lack of Transportation (Non-Medical):    Physical Activity:    ? Days of Exercise per Week:    ? Minutes of Exercise per Session:    Stress:    ? Feeling of Stress :    Social Connections:    ? Frequency of Communication with Friends and Family:    ? Frequency of Social Gatherings with Friends and Family:    ? Attends Religious Services:    ? Active Member of Clubs or Organizations:    ? Attends Banker Meetings:    ? Marital Status:    Intimate Partner Violence:    ? Fear of Current or Ex-Partner:    ? Emotionally Abused:    ? Physically Abused:    ? Sexually Abused:       Vaping/E-liquid Use   ? Vaping Use Never User Immunizations (includes history and patient reported):   Immunization History   Administered Date(s) Administered   ? Tdap Vaccine 11/11/2015           Allergies:  Chlorhexidine gluconate and Colchicine    Medications:  Current Facility-Administered Medications   Medication   ? potassium chloride SR (K-DUR) tablet 40 mEq     Current Outpatient Medications   Medication Sig   ? allopurinoL (ZYLOPRIM) 100 mg tablet Take one tablet by mouth at bedtime daily. Take with food.   ? amiodarone (CORDARONE) 200 mg tablet Take one tablet by mouth daily. Take with food.   ? aspirin EC 81 mg tablet Take 81 mg by mouth daily. Take with food.   ? ergocalciferol (VITAMIN D-2) 1,250 mcg (50,000 unit) capsule TAKE 1 CAPSULE BY MOUTH 1 TIME A WEEK   ? furosemide (LASIX) 40 mg tablet Take 1.5 tablets by mouth daily.   ? levothyroxine (SYNTHROID) 50 mcg tablet Take one tablet by mouth daily 30 minutes before breakfast.   ? potassium chloride (KLOR-CON) 10 mEq tablet Take one tablet by mouth daily. Take with a meal and a full glass of water.   ? STIMULANT LAXATIVE PLUS 8.6-50 mg tablet Take 1 tablet by mouth twice daily (at 10AM and 10PM).     Review of Systems:  A 14 point review of systems was negative except for: As stated  in subjective complaints    Physical Exam:  Vital Signs: Last Filed In 24 Hours Vital Signs: 24 Hour Range   BP: 154/91 (09/29 1202)  Temp: 36.6 ?C (97.8 ?F) (09/29 1033)  Pulse: 79 (09/29 1202)  Respirations: 26 PER MINUTE (09/29 1202)  SpO2: 98 % (09/29 1202)  SpO2 Pulse: 78 (09/29 1202)  Height: 182.9 cm (72) (09/29 1033) BP: (154-159)/(91-92)   Temp:  [36.6 ?C (97.8 ?F)]   Pulse:  [76-79]   Respirations:  [26 PER MINUTE]   SpO2:  [98 %]           General: Elderly male patient, alert, awake and oriented x3.   Head: Normocephalic, without obvious abnormality, atraumatic   Nose/Throat: Mucous membrane moist.   Eyes: Conjunctivae/corneas clear. PERRL, EOMs intact.   Neck: Supple, symmetrical, No JVD, mildly positive hepatojugular reflux no bruit   Lungs: Clear to auscultation bilaterally   Back: Non tender, no kyphosis or scoliosis  Heart: S1, S2 heard, Regular rate and rhythm, no murmur, click rub or gallop   Abdomen: Soft, non-tender, non distended, Bowel sounds normal.  Extremities: No cyanosis or clubbing. Pulses: 2+ and symmetric, all extremities.  +1 to +2 pitting edema in bilateral lower extremities  Neurologic: CNII - XII intact.  Moving all extremities  Skin: Clear, no rashes   Psyche: Normal affect      Lab/Radiology/Other Diagnostic Tests:  24-hour labs:    Results for orders placed or performed during the hospital encounter of 10/27/19 (from the past 24 hour(s))   POC TROPONIN    Collection Time: 10/27/19 10:41 AM   Result Value Ref Range    Troponin-I-POC 0.02 0.00 - 0.05 NG/ML   CBC AND DIFF    Collection Time: 10/27/19 11:18 AM   Result Value Ref Range    White Blood Cells 4.7 4.5 - 11.0 K/UL    RBC 5.47 4.4 - 5.5 M/UL    Hemoglobin 15.8 13.5 - 16.5 GM/DL    Hematocrit 16.1 40 - 50 %    MCV 86.8 80 - 100 FL    MCH 28.9 26 - 34 PG    MCHC 33.3 32.0 - 36.0 G/DL    RDW 09.6 (H) 11 - 15 %    Platelet Count 171 150 - 400 K/UL    MPV 8.5 7 - 11 FL    Neutrophils 70 41 - 77 %    Lymphocytes 16 (L) 24 - 44 %    Monocytes 10 4 - 12 %    Eosinophils 3 0 - 5 %    Basophils 1 0 - 2 %    Absolute Neutrophil Count 3.28 1.8 - 7.0 K/UL    Absolute Lymph Count 0.77 (L) 1.0 - 4.8 K/UL    Absolute Monocyte Count 0.49 0 - 0.80 K/UL    Absolute Eosinophil Count 0.13 0 - 0.45 K/UL    Absolute Basophil Count 0.03 0 - 0.20 K/UL    MDW (Monocyte Distribution Width) 19.3 <20.7   COMPREHENSIVE METABOLIC PANEL    Collection Time: 10/27/19 11:18 AM   Result Value Ref Range    Sodium 136 (L) 137 - 147 MMOL/L    Potassium 3.7 3.5 - 5.1 MMOL/L    Chloride 98 98 - 110 MMOL/L    Glucose 125 (H) 70 - 100 MG/DL    Blood Urea Nitrogen 33 (H) 7 - 25 MG/DL    Creatinine 0.45 (H) 0.4 - 1.24 MG/DL    Calcium 9.0 8.5 - 40.9  MG/DL    Total Protein 7.6 6.0 - 8.0 G/DL    Total Bilirubin 0.6 0.3 - 1.2 MG/DL    Albumin 3.9 3.5 - 5.0 G/DL    Alk Phosphatase 161 (H) 25 - 110 U/L    AST (SGOT) 17 7 - 40 U/L    CO2 27 21 - 30 MMOL/L    ALT (SGPT) 11 7 - 56 U/L    Anion Gap 11 3 - 12    eGFR Non African American 36 (L) >60 mL/min    eGFR African American 43 (L) >60 mL/min   MAGNESIUM    Collection Time: 10/27/19 11:18 AM   Result Value Ref Range    Magnesium 2.4 1.6 - 2.6 mg/dL   TSH WITH FREE T4 REFLEX    Collection Time: 10/27/19 11:18 AM   Result Value Ref Range    TSH 4.48 0.35 - 5.00 MCU/ML   BNP POC ER    Collection Time: 10/27/19 11:25 AM   Result Value Ref Range    BNP POC 720.0 (H) 0 - 100 PG/ML   URINALYSIS DIPSTICK REFLEX TO CULTURE    Collection Time: 10/27/19 12:35 PM    Specimen: Urine   Result Value Ref Range    Color,UA YELLOW     Turbidity,UA CLEAR CLEAR-CLEAR    Specific Gravity-Urine 1.012 1.003 - 1.035    pH,UA 6.0 5.0 - 8.0    Protein,UA NEG NEG-NEG    Glucose,UA NEG NEG-NEG    Ketones,UA NEG NEG-NEG    Bilirubin,UA NEG NEG-NEG    Blood,UA NEG NEG-NEG    Urobilinogen,UA NORMAL NORM-NORMAL    Nitrite,UA NEG NEG-NEG    Leukocytes,UA NEG NEG-NEG    Urine Ascorbic Acid, UA POS (A) NEG-NEG   URINALYSIS MICROSCOPIC REFLEX TO CULTURE    Collection Time: 10/27/19 12:35 PM    Specimen: Urine   Result Value Ref Range    WBCs,UA 0-2 0 - 2 /HPF    RBCs,UA 0-2 0 - 3 /HPF    Comment,UA       Criteria for reflex to culture are WBC>10, Positive Nitrite, and/or >=+1   leukocytes. If quantity is not sufficient, an addendum will follow.      Squamous Epithelial Cells 0-2 0 - 5     Glucose: (!) 125 (10/27/19 1118)  Pertinent radiology reviewed.     CT HEAD WO CONTRAST    Result Date: 10/27/2019  1. No acute intracranial hemorrhage or mass effect. 2. Stable generalized cerebral volume loss 3. Unchanged mild to moderate low attenuation/hypodensities within the cerebral hemispheres compatible with chronic small vessel ischemic changes 4. Grossly stable partially visualized 2.1 cm mass or cyst within the midline low posterior neck suggestive of a sebaceous cyst. Clinical correlation is suggested.  Finalized by Marily Memos, M.D. on 10/27/2019 12:12 PM. Dictated by Marily Memos, M.D. on 10/27/2019 12:03 PM.       Amado Coe, MD  Pager [phone]

## 2019-10-27 NOTE — ED Notes
[employer] representative called regarding pt report. Per representative, pt device is functioning as intended. Pt had x 2 nonsustained episodes of v-tach with last episodes on 9/13.

## 2019-10-27 NOTE — ED Notes
Pt arrived to the ED A&Ox4 with a CC of shortness of breath. Pt reports that he has had increased SOB the last few weeks while at rest and with activity. Pt reports that a family member came over and knocked on the door this morning and he did not wake up to the noise. Pt reports that the police then came over and decided he needed to go to the hospital. On exam, pt legs are swollen bilaterally. Pt reports that he has not missed taking any of his medication recently. Pt denies pain at this time.      Medical History:   Diagnosis Date   ? Arthritis    ? CAD (coronary artery disease)    ? Dyslipidemia    ? Fracture     RT ANKLE   ? History of ventricular tachycardia    ? HTN (hypertension)    ? ICD (implantable cardiac defibrillator) in place    ? Ischemic cardiomyopathy    ? Neurologic cardiac syncope    ? Renal insufficiency     mild   ? S/P coronary artery stent placement           Pt denies CP, dizziness,palpitation, abdominal pain, and N/V.     Pt resting in bed, call light within reach, bed locked in lowest position.     All belongings gathered at patient bedside.

## 2019-10-27 NOTE — Telephone Encounter
Progress Note  SW Case Manager    Plan: SW received incoming call from patient's emergency contact, [name] Minor, regarding concerns of patient's physical and mental health.    Intervention: SW received incoming call for patient's emergency contact, [name] Minor. Ms. Minor noted she called 911 this morning out of concern for patient's declining health. Ms. Minor reported patient is currently being taken by ambulance to [facility]. Ms. Minor stated she is concerned the patient has previously refused to be admitted to the hospital despite needing medical attention. SW has previously made APS report due to concerns of patient's living environment, physical/ mental decline and mismanagement of medications. SW will reach out to inpatient SW if patient is admitted to provide additional insight.       Sherlie Ban, LMSW  Outpatient Heart Failure Social Worker  Cell: [phone]  Office: [phone]

## 2019-10-27 NOTE — Progress Notes
RT Adult Assessment Note    NAME:Logan Bright             MRN: [id number]             DOB:Jun 12, 1935          AGE: 84 y.o.  ADMISSION DATE: 10/27/2019             DAYS ADMITTED: LOS: 0 days    RT Treatment Plan:       Protocol Plan: Procedures  PEP Therapy: Place a nursing order for IS Q1h While Awake for any of Lung Expansion indicators    Additional Comments:  Impressions of the patient: pt resting in bed, NAD  Intervention(s)/outcome(s): RT assessment  Patient education that was completed: none  Recommendations to the care team: none    Vital Signs:  Pulse: 69  RR: 18 PER MINUTE  SpO2: 96 %  O2 Device:    Liter Flow:    O2%:  (RA)  Breath Sounds: Clear (Implies normal)  Respiratory Effort: Non-Labored

## 2019-10-28 ENCOUNTER — Encounter
Admit: 2019-10-28 | Discharge: 2019-10-28 | Payer: MEDICARE | Primary: Student in an Organized Health Care Education/Training Program

## 2019-10-28 DIAGNOSIS — Z95810 Presence of automatic (implantable) cardiac defibrillator: Secondary | ICD-10-CM

## 2019-10-28 DIAGNOSIS — N289 Disorder of kidney and ureter, unspecified: Secondary | ICD-10-CM

## 2019-10-28 DIAGNOSIS — I255 Ischemic cardiomyopathy: Secondary | ICD-10-CM

## 2019-10-28 DIAGNOSIS — M1990 Unspecified osteoarthritis, unspecified site: Secondary | ICD-10-CM

## 2019-10-28 DIAGNOSIS — T148XXA Other injury of unspecified body region, initial encounter: Secondary | ICD-10-CM

## 2019-10-28 DIAGNOSIS — I2510 Atherosclerotic heart disease of native coronary artery without angina pectoris: Secondary | ICD-10-CM

## 2019-10-28 DIAGNOSIS — R55 Syncope and collapse: Secondary | ICD-10-CM

## 2019-10-28 DIAGNOSIS — I10 Essential (primary) hypertension: Secondary | ICD-10-CM

## 2019-10-28 DIAGNOSIS — E785 Hyperlipidemia, unspecified: Secondary | ICD-10-CM

## 2019-10-28 DIAGNOSIS — Z955 Presence of coronary angioplasty implant and graft: Secondary | ICD-10-CM

## 2019-10-28 DIAGNOSIS — Z8679 Personal history of other diseases of the circulatory system: Secondary | ICD-10-CM

## 2019-10-28 MED ADMIN — FUROSEMIDE 10 MG/ML IJ SOLN [3291]: 80 mg | INTRAVENOUS | @ 16:00:00 | Stop: 2019-10-28 | NDC 63323028003

## 2019-10-29 ENCOUNTER — Encounter
Admit: 2019-10-29 | Discharge: 2019-10-29 | Payer: MEDICARE | Primary: Student in an Organized Health Care Education/Training Program

## 2019-10-29 MED ADMIN — ACETAMINOPHEN/LIDOCAINE/ANTACID DS(#) 1:1:3  PO SUSP [210000]: 30 mL | ORAL | @ 11:00:00 | Stop: 2019-10-29 | NDC 54029002209

## 2019-11-01 ENCOUNTER — Encounter
Admit: 2019-11-01 | Discharge: 2019-11-01 | Payer: MEDICARE | Primary: Student in an Organized Health Care Education/Training Program

## 2019-11-01 NOTE — Telephone Encounter
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 No  If yes, reason: N/A  Hospital Services: Unplanned  Today's call is 1(business) days post discharge      Discharge Instruction Review   Did patient receive and understand discharge instructions? Yes    Home Health ordered? No                 Agency name/telephone number: N/A   Has Home Health agency contacted patient? No   Caregiver assistance in the home? No   Are there concerns regarding the patient's ADL'S? No  Is patient a fall risk? No    Special diet? Yes If yes, type: Low sodium diet      Medication Reconciliation    Changes to pre-hospital medications? Yes   CHANGE how you take:  furosemide (LASIX)  Were new prescriptions filled?Yes   START taking:  metoprolol XL (TOPROL XL) Start taking on: October 30, 2019  Meds reviewed and reconciled?Yes  ? allopurinoL (ZYLOPRIM) 100 mg tablet Take one tablet by mouth at bedtime daily. Take with food.   ? amiodarone (CORDARONE) 200 mg tablet Take one tablet by mouth daily. Take with food.   ? aspirin 81 mg chewable tablet Chew 81 mg by mouth daily. Take with food.   ? ergocalciferol (VITAMIN D-2) 1,250 mcg (50,000 unit) capsule TAKE 1 CAPSULE BY MOUTH 1 TIME A WEEK   ? furosemide (LASIX) 40 mg tablet Take two tablets by mouth daily. Please take 80mg  (2 tablets) twice a day for 1 week, then return to 80mg  once a day after this   ? levothyroxine (SYNTHROID) 50 mcg tablet Take one tablet by mouth daily 30 minutes before breakfast.   ? metoprolol XL (TOPROL XL) 25 mg extended release tablet Take one-half tablet by mouth daily.         Understanding Condition   Having any current symptoms? Yes, Patient states he has a little bit of edema to his left ankle.  Patietn denies chest pain, shortness of breath, cough, fever/chills, nausea/vomiting, diarrhea/constipation.  Patient denies checking his weight at home but states he plans to start.  Patient denies checking his blood pressure at home.  Patient understands when to seek additional medical care? Yes   Other instructions provided :  Weight self first thing in the a.m before eating and after using the restroom and report weight gain of 2 lbs in a day or 5 lbs in 1 week to cardiologist.     Scheduling Follow-up Appointment   Upcoming appointment date and time and with whom scheduled:   Future Appointments   Date Time Provider Department Center   11/05/2019  9:30 AM Eliberto Ivory, APRN-NP [id number] CVM Exam   11/08/2019  8:45 AM Rubie Maid, DO [facility] IM   01/17/2020 10:00 AM Juliann Pares, DO [facility] IM   03/09/2020  2:00 PM [facility] CVM Procedur   03/09/2020  2:30 PM Vanice Sarah, MD [facility] CVM Exam  PCP appointment scheduled?Yes, Date: 11/08/2019 and 01/17/2020 Patient states he was unaware that he had a pcp appt this morning. 1st available hospital follow up appointment was for 11/04/2019 but patient declined and wanted a Monday or tues appt.  PCP primary location: [facility]  [facility]  Specialist appointment scheduled? Yes, with Cardiology 11/05/2019, and 03/09/2020  Both PCP and Specialist appointment scheduled: Yes  Is assistance with transportation needed?No   MyChart message sent? Active in MyChart. No message sent.     Yancey Flemings, RN

## 2019-11-02 ENCOUNTER — Encounter
Admit: 2019-11-02 | Discharge: 2019-11-02 | Payer: MEDICARE | Primary: Student in an Organized Health Care Education/Training Program

## 2019-11-08 ENCOUNTER — Encounter
Admit: 2019-11-08 | Discharge: 2019-11-08 | Payer: MEDICARE | Primary: Student in an Organized Health Care Education/Training Program

## 2019-11-08 ENCOUNTER — Ambulatory Visit
Admit: 2019-11-08 | Discharge: 2019-11-08 | Payer: MEDICARE | Primary: Student in an Organized Health Care Education/Training Program

## 2019-11-08 DIAGNOSIS — Z955 Presence of coronary angioplasty implant and graft: Secondary | ICD-10-CM

## 2019-11-08 DIAGNOSIS — Z8679 Personal history of other diseases of the circulatory system: Secondary | ICD-10-CM

## 2019-11-08 DIAGNOSIS — I10 Essential (primary) hypertension: Secondary | ICD-10-CM

## 2019-11-08 DIAGNOSIS — Z95810 Presence of automatic (implantable) cardiac defibrillator: Secondary | ICD-10-CM

## 2019-11-08 DIAGNOSIS — E785 Hyperlipidemia, unspecified: Secondary | ICD-10-CM

## 2019-11-08 DIAGNOSIS — I5043 Acute on chronic combined systolic (congestive) and diastolic (congestive) heart failure: Principal | ICD-10-CM

## 2019-11-08 DIAGNOSIS — N289 Disorder of kidney and ureter, unspecified: Secondary | ICD-10-CM

## 2019-11-08 DIAGNOSIS — I2510 Atherosclerotic heart disease of native coronary artery without angina pectoris: Secondary | ICD-10-CM

## 2019-11-08 DIAGNOSIS — I255 Ischemic cardiomyopathy: Secondary | ICD-10-CM

## 2019-11-08 DIAGNOSIS — Z09 Encounter for follow-up examination after completed treatment for conditions other than malignant neoplasm: Secondary | ICD-10-CM

## 2019-11-08 DIAGNOSIS — T148XXA Other injury of unspecified body region, initial encounter: Secondary | ICD-10-CM

## 2019-11-08 DIAGNOSIS — R55 Syncope and collapse: Secondary | ICD-10-CM

## 2019-11-08 DIAGNOSIS — M1990 Unspecified osteoarthritis, unspecified site: Secondary | ICD-10-CM

## 2019-11-08 LAB — BASIC METABOLIC PANEL
Lab: 1.7 mg/dL — ABNORMAL HIGH (ref 0.4–1.24)
Lab: 101 mg/dL — ABNORMAL HIGH (ref 70–100)
Lab: 135 MMOL/L — ABNORMAL LOW (ref >60)
Lab: 27 MMOL/L (ref 21–30)
Lab: 28 mg/dL — ABNORMAL HIGH (ref 7–25)
Lab: 38 mL/min — ABNORMAL LOW (ref >60)
Lab: 4.1 MMOL/L (ref >60)
Lab: 46 mL/min — ABNORMAL LOW (ref >60)
Lab: 8.7 mg/dL (ref 8.5–10.6)
Lab: 9 (ref 3–12)

## 2019-11-08 LAB — MAGNESIUM: Lab: 2.3 mg/dL (ref 1.6–2.6)

## 2019-11-10 ENCOUNTER — Encounter
Admit: 2019-11-10 | Discharge: 2019-11-10 | Payer: MEDICARE | Primary: Student in an Organized Health Care Education/Training Program

## 2019-11-10 MED ORDER — LEVOTHYROXINE 50 MCG PO TAB
ORAL_TABLET | Freq: Every day | ORAL | 3 refills | 30.00000 days | Status: AC
Start: 2019-11-10 — End: ?

## 2019-11-16 ENCOUNTER — Encounter
Admit: 2019-11-16 | Discharge: 2019-11-16 | Payer: MEDICARE | Primary: Student in an Organized Health Care Education/Training Program

## 2019-11-16 NOTE — Progress Notes
-----   Message -----  From: Lemont Fillers  Sent: 11/16/2019   9:55 AM CDT  To: Caryl Pina, RN  Subject: RE: Hospital Follow Up                           Good Morning,    Appt scheduled 11/16 with Dr.Owens in [place].    Thank you!    ----- Message -----  From: Caryl Pina, RN  Sent: 11/16/2019   8:49 AM CDT  To: Lemont Fillers  Subject: FW: Hospital Follow Up                           Wynona Canes,     Looks like this pt is supposed to see SDO in the next couple months per msgs below. Someone left him a msg 10/11 for f/u but I don't see he's called back. Could you see if you can get him scheduled with [facility] or an NP either [facility]? Thanks!    ----- Message -----  From: Connye Burkitt, RN  Sent: 11/08/2019   9:41 AM CDT  To: Cvm Nurse [place]  Subject: FW: Hospital Follow Up                           ----- Message -----  From: Doy Mince, RN  Sent: 11/08/2019   9:40 AM CDT  To: Connye Burkitt, RN  Subject: FW: Hospital Follow Up                           ----- Message -----  From: Eliberto Ivory, APRN-NP  Sent: 11/08/2019   9:35 AM CDT  To: Cvm Nurse Gen Card Team [facility]  Subject: FW: Hospital Follow Up                           This is a patient of Dr. Barry Dienes.  Can you get him to see Dr. Barry Dienes or one of the general cardiology APP's.  If he cannot make any of these appointments I am happy to see again. Thanks, Lowella Bandy  ----- Message -----  From: Rubie Maid, DO  Sent: 11/08/2019   9:24 AM CDT  To: Eliberto Ivory, APRN-NP  Subject: Hospital Follow Up                               Hello!     I just saw this patient for HFU. He is a patient of Dr. Barry Dienes and was supposed to meet with you on 10/8 however he no showed. His weight today was 211 from 209 at discharge and reported being compliant with his diuretic lasix regiment (80 BID for 1 week then 40 BID thereafter, home dose). I tried to reschedule the cardiology clinic FU however it's locking me out stating he still has the 10/8 appt. Is it possible to have him see you in the next month or two?     Thank you so much.     Rubie Maid DO PGY-2

## 2019-11-29 ENCOUNTER — Encounter
Admit: 2019-11-29 | Discharge: 2019-11-29 | Payer: MEDICARE | Primary: Student in an Organized Health Care Education/Training Program

## 2019-11-29 MED ORDER — AMIODARONE 200 MG PO TAB
ORAL_TABLET | Freq: Every day | ORAL | 3 refills | 42.00000 days | Status: AC
Start: 2019-11-29 — End: ?

## 2019-12-28 ENCOUNTER — Encounter
Admit: 2019-12-28 | Discharge: 2019-12-28 | Payer: MEDICARE | Primary: Student in an Organized Health Care Education/Training Program

## 2019-12-28 MED ORDER — ALLOPURINOL 100 MG PO TAB
ORAL_TABLET | Freq: Every evening | ORAL | 3 refills | 45.00000 days | Status: AC
Start: 2019-12-28 — End: ?

## 2020-01-03 ENCOUNTER — Encounter
Admit: 2020-01-03 | Discharge: 2020-01-03 | Payer: MEDICARE | Primary: Student in an Organized Health Care Education/Training Program

## 2020-01-03 DIAGNOSIS — I480 Paroxysmal atrial fibrillation: Secondary | ICD-10-CM

## 2020-01-03 DIAGNOSIS — Z79899 Other long term (current) drug therapy: Secondary | ICD-10-CM

## 2020-01-03 DIAGNOSIS — I255 Ischemic cardiomyopathy: Secondary | ICD-10-CM

## 2020-01-03 NOTE — Progress Notes
Labs and/or CXR for amiodarone surveillance due per Amiodarone protocol.  Lab requisitions/orders and letter explaining need for testing mailed to patient or given to patient in clinic.

## 2020-01-11 ENCOUNTER — Encounter
Admit: 2020-01-11 | Discharge: 2020-01-11 | Payer: MEDICARE | Primary: Student in an Organized Health Care Education/Training Program

## 2020-01-11 DIAGNOSIS — I10 Essential (primary) hypertension: Secondary | ICD-10-CM

## 2020-01-11 DIAGNOSIS — I255 Ischemic cardiomyopathy: Secondary | ICD-10-CM

## 2020-01-11 DIAGNOSIS — I480 Paroxysmal atrial fibrillation: Secondary | ICD-10-CM

## 2020-01-11 DIAGNOSIS — I5043 Acute on chronic combined systolic (congestive) and diastolic (congestive) heart failure: Secondary | ICD-10-CM

## 2020-01-11 DIAGNOSIS — R55 Syncope and collapse: Secondary | ICD-10-CM

## 2020-01-11 DIAGNOSIS — Z955 Presence of coronary angioplasty implant and graft: Secondary | ICD-10-CM

## 2020-01-11 DIAGNOSIS — Z8679 Personal history of other diseases of the circulatory system: Secondary | ICD-10-CM

## 2020-01-11 DIAGNOSIS — Z79899 Other long term (current) drug therapy: Secondary | ICD-10-CM

## 2020-01-11 DIAGNOSIS — E785 Hyperlipidemia, unspecified: Secondary | ICD-10-CM

## 2020-01-11 DIAGNOSIS — M1990 Unspecified osteoarthritis, unspecified site: Secondary | ICD-10-CM

## 2020-01-11 DIAGNOSIS — N289 Disorder of kidney and ureter, unspecified: Secondary | ICD-10-CM

## 2020-01-11 DIAGNOSIS — I2510 Atherosclerotic heart disease of native coronary artery without angina pectoris: Secondary | ICD-10-CM

## 2020-01-11 DIAGNOSIS — T148XXA Other injury of unspecified body region, initial encounter: Secondary | ICD-10-CM

## 2020-01-11 DIAGNOSIS — Z95810 Presence of automatic (implantable) cardiac defibrillator: Secondary | ICD-10-CM

## 2020-01-11 LAB — BASIC METABOLIC PANEL
Lab: 1.9 — ABNORMAL HIGH
Lab: 104
Lab: 140
Lab: 17 — ABNORMAL HIGH
Lab: 28
Lab: 31 — ABNORMAL HIGH
Lab: 34
Lab: 4.3
Lab: 8.9
Lab: 99

## 2020-01-11 MED ORDER — FUROSEMIDE 40 MG PO TAB
80 mg | ORAL_TABLET | Freq: Every day | ORAL | 0 refills | 90.00000 days | Status: AC
Start: 2020-01-11 — End: ?

## 2020-01-11 NOTE — Progress Notes
Date of Service: 01/11/2020    Logan Bright is a 84 y.o. male.       HPI     Logan Bright was in the [place] clinic today, accompanied by one of the employees of the restaurant below his apartment.  The restaurant owner and the lady with him today have been trying to care for Logan Bright but it is apparent that he is really beyond their ability to watch after him.  We really to the point where we need help from social service for placement if at all possible.  Both of these women are going to be out of town for the holidays and they say Logan Bright is incontinent and really pretty much completely unable to care for himself at this point.    He has problems with peripheral edema that seem fairly stable at this point.  He denies any breathlessness or chest discomfort.  The friend with him today says that she is frequently concerned that he is just sitting around with a blank look on his face.         Vitals:    01/11/20 1321 01/11/20 1332   BP: 104/72 112/68   BP Source: Arm, Left Upper Arm, Right Upper   Patient Position: Sitting Sitting   Pulse: 75    SpO2: 98%    Weight: 96.3 kg (212 lb 6.4 oz)    Height: 1.829 m (6')    PainSc: Zero      Body mass index is 28.81 kg/m?[name]     Past Medical History  Patient Active Problem List    Diagnosis Date Noted   ? Shortness of breath 10/29/2019   ? Heart failure exacerbated by sotalol (HCC) 10/27/2019   ? Heart failure (HCC) 07/01/2019   ? Acute respiratory failure with hypoxia (HCC) 07/01/2019   ? History of polycythemia 12/14/2018   ? Hyponatremia 09/04/2018   ? Hypothyroidism 09/04/2018   ? Paroxysmal atrial fibrillation (HCC) 04/07/2018   ? 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): Bilateral antegrade vertebral artery flow. No hemodynamically significant common or internal carotid artery stenosis.     ? Closed trimalleolar fracture of right ankle 11/12/2015   ? Debility 06/10/2013   ? Recurrent ventricular tachycardia w/ 2 VT ablations 04/2013 05/11/2013     Sustained VT w/ multiple ICD tachypacing and Shocks, failed amiodaron   05/05/13 & 05/12/13  VT ablations [facility] w/ impella support 4/15, Home off Coreg and losartan d/t post procedure hematoma, hypotension     ? 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 Ermelinda Das.        ? Ischemic cardiomyopathy      6/02 - infarct 2 stents .  [facility]            4/04 - Surveillance stress test at [facility]: reportedly negative.  4/05 - Stress thallium study: EF 30%, LAD and RCA defects, elevated lung uptake.- Echo doppler: EF 25%, amt& inf hypokinesis,  apical akinesis, poss  thrombus, mild valvular Regurg       5/06 - Stress thallium: EF 22%, no signficant interval change.  08/2011 - Dobut echo.  EF 10-15%. Antero-apical akinesis/dyskinesis.  No ischemia.  04/2013 RegadenThall EF 23% Extensive  Anterior infarct, no ischemia, unchanged from prior     ? Acute on chronic combined systolic (congestive) and diastolic (congestive) heart failure (HCC)       2003 - Status post ICD implant.    4/05 - EF 25-30% per stress thallium and echo doppler.  08/2011 - Echo EF 10-15%     ? Neurologic cardiac syncope        3/05 - Developed syncope while at restaurant, taken to ED.  Symptoms of diaphoresis                    and lightheadedness.          4/05 - Head up tilt study: positive for inducing hypotension 72/49. Produced symptoms                    c/w previous syncopal episode.     ? HTN (hypertension)                 2002 - Diagnosis established.     ? Dyslipidemia         09/26/2008  03/14/2009  02/26/2011  04/17/2012  03/31/2013   LDL 107 (H) 141 (H) 80 108 (H) 104 (H)   04/2013: Refuses Statins understands that they reduce recurrent CV events, still declines.      ? Gout ? CKD (chronic kidney disease)      mild           Review of Systems   Constitutional: Negative.   HENT: Negative.    Eyes: Negative.    Cardiovascular: Negative.    Respiratory: Negative.    Endocrine: Negative.    Hematologic/Lymphatic: Negative.    Skin: Negative.    Musculoskeletal: Positive for gout.   Gastrointestinal: Negative.    Genitourinary: Negative.    Neurological: Negative.    Psychiatric/Behavioral: Negative.    Allergic/Immunologic: Negative.        Physical Exam    Physical Exam   General Appearance: no distress   Skin: warm, no ulcers or xanthomas   Digits and Nails: no cyanosis or clubbing   Eyes: conjunctivae and lids normal, pupils are equal and round   Teeth/Gums/Palate: dentition unremarkable, no lesions   Lips & Oral Mucosa: no pallor or cyanosis   Neck Veins: normal JVP , neck veins are not distended   Thyroid: no nodules, masses, tenderness or enlargement   Chest Inspection: chest is normal in appearance   Respiratory Effort: breathing comfortably, no respiratory distress   Auscultation/Percussion: lungs clear to auscultation, no rales or rhonchi, no wheezing   PMI: PMI not enlarged or displaced   Cardiac Rhythm: regular rhythm and normal rate   Cardiac Auscultation: S1, S2 normal, no rub, no gallop   Murmurs: no murmur   Peripheral Circulation: normal peripheral circulation   Carotid Arteries: normal carotid upstroke bilaterally, no bruits   Radial Arteries: normal symmetric radial pulses   Abdominal Aorta: no abdominal aortic bruit   Pedal Pulses: normal symmetric pedal pulses   Lower Extremity Edema: no lower extremity edema   Abdominal Exam: soft, non-tender, no masses, bowel sounds normal   Liver & Spleen: no organomegaly   Gait & Station: walks without assistance   Muscle Strength: normal muscle tone   Orientation: oriented to time, place and person   Affect & Mood: appropriate and sustained affect   Language and Memory: patient responsive and seems to comprehend information Neurologic Exam: neurological assessment grossly intact   Other: moves  all extremities      Cardiovascular Health Factors  Vitals BP Readings from Last 3 Encounters:   01/11/20 112/68   11/08/19 139/88   10/29/19 118/71     Wt Readings from Last 3 Encounters:   01/11/20 96.3 kg (212 lb 6.4 oz)   11/08/19 95.7 kg (211 lb)   10/29/19 94.8 kg (209 lb)     BMI Readings from Last 3 Encounters:   01/11/20 28.81 kg/m?   11/08/19 28.62 kg/m?   10/29/19 28.35 kg/m?      Smoking Social History     Tobacco Use   Smoking Status Never Smoker   Smokeless Tobacco Never Used      Lipid Profile Cholesterol   Date Value Ref Range Status   10/28/2019 124 <200 MG/DL Final     HDL   Date Value Ref Range Status   10/28/2019 40 (L) >40 MG/DL Final     LDL   Date Value Ref Range Status   10/28/2019 77 <100 mg/dL Final     Triglycerides   Date Value Ref Range Status   10/28/2019 82 <150 MG/DL Final      Blood Sugar Hemoglobin A1C   Date Value Ref Range Status   10/28/2019 6.6 (H) 4.0 - 6.0 % Final     Comment:     The ADA recommends that most patients with type 1 and type 2 diabetes maintain   an A1c level <7%.       Glucose   Date Value Ref Range Status   11/08/2019 101 (H) 70 - 100 MG/DL Final   16/10/9602 96 70 - 100 MG/DL Final   54/09/8117 95 70 - 100 MG/DL Final     Glucose, POC   Date Value Ref Range Status   10/04/2019 116 (H) 70 - 100 MG/DL Final   14/78/2956 213 (H) 70 - 100 MG/DL Final          Problems Addressed Today  Encounter Diagnoses   Name Primary?   ? Acute on chronic combined systolic (congestive) and diastolic (congestive) heart failure (HCC) Yes   ? Paroxysmal atrial fibrillation (HCC)    ? Primary hypertension    ? Acute on chronic combined systolic and diastolic heart failure (HCC)        Assessment and Plan       Paroxysmal atrial fibrillation (HCC)  He was switched from sotalol to amiodarone during the recent admission.    HTN (hypertension)  Blood pressure seems pretty optimal on the current medical program.    Heart failure (HCC)  His weight seems fairly stable and he denies heart failure symptoms currently.      Current Medications (including today's revisions)  ? allopurinoL (ZYLOPRIM) 100 mg tablet TAKE 1 TABLET BY MOUTH EVERY NIGHT AT BEDTIME WITH FOOD   ? amiodarone (CORDARONE) 200 mg tablet TAKE 1 TABLET BY MOUTH EVERY DAY WITH FOOD   ? aspirin 325 mg tablet Take 325 mg by mouth daily. Take with food.   ? ergocalciferol (VITAMIN D-2) 1,250 mcg (50,000 unit) capsule TAKE 1 CAPSULE BY MOUTH 1 TIME A WEEK   ? furosemide (LASIX) 40 mg tablet Take two tablets by mouth daily.   ? levothyroxine (SYNTHROID) 50 mcg tablet TAKE 1 TABLET BY MOUTH EVERY DAY 30 MINUTES BEFORE BREAKFAST   ? metoprolol XL (TOPROL XL) 25 mg extended release tablet Take one-half tablet by mouth daily.     Total time spent on today's office visit was 30 minutes.  This includes face-to-face in person visit with patient as well as nonface-to-face time including review of the EMR, outside records, labs, radiologic studies, echocardiogram & other cardiovascular studies, formation of treatment plan, after visit summary, future disposition, and lastly on documentation.

## 2020-01-11 NOTE — Assessment & Plan Note
Blood pressure seems pretty optimal on the current medical program.

## 2020-01-11 NOTE — Assessment & Plan Note
His weight seems fairly stable and he denies heart failure symptoms currently.

## 2020-01-11 NOTE — Assessment & Plan Note
He was switched from sotalol to amiodarone during the recent admission.

## 2020-01-12 ENCOUNTER — Encounter
Admit: 2020-01-12 | Discharge: 2020-01-12 | Payer: MEDICARE | Primary: Student in an Organized Health Care Education/Training Program

## 2020-01-12 NOTE — Telephone Encounter
Progress Note  SW Case Manager    Plan: SW received request for assistance from Katina Degree, RN due to need for assessment for patient to safely remain in the home.     Intervention: SW made out bound call to patient's DPOA/ friend, Logan Bright, regarding patient's current situation. Ms. Bright stated patient has continually declined mentally and physically. Ms. Bright enforced that nothing has changed with patient's circumstances/ living situation since previous conversation (see telephone note on 10/19/2019). Ms. Bright shared that she is feeling overwhelmed with the patient's care and feels a higher level of care may be appropriate at this time; however, the patient is adamant that he will remain in his home.      SW sent referral to medical-legal partnership to reach out to patient's DPOA about any options moving forward.      SW made another [place] [facility] [employer] report specifically requesting an in home visit, confirmation number [phone].    Smitty Knudsen, LMSW  Outpatient Heart Failure Social Worker  Cell: [phone]  Office: [phone]

## 2020-01-13 ENCOUNTER — Encounter
Admit: 2020-01-13 | Discharge: 2020-01-13 | Payer: MEDICARE | Primary: Student in an Organized Health Care Education/Training Program

## 2020-01-17 ENCOUNTER — Encounter
Admit: 2020-01-17 | Discharge: 2020-01-17 | Payer: MEDICARE | Primary: Student in an Organized Health Care Education/Training Program

## 2020-01-17 ENCOUNTER — Ambulatory Visit
Admit: 2020-01-17 | Discharge: 2020-01-18 | Payer: MEDICARE | Primary: Student in an Organized Health Care Education/Training Program

## 2020-01-17 DIAGNOSIS — N289 Disorder of kidney and ureter, unspecified: Secondary | ICD-10-CM

## 2020-01-17 DIAGNOSIS — I10 Essential (primary) hypertension: Secondary | ICD-10-CM

## 2020-01-17 DIAGNOSIS — M1990 Unspecified osteoarthritis, unspecified site: Secondary | ICD-10-CM

## 2020-01-17 DIAGNOSIS — Z95810 Presence of automatic (implantable) cardiac defibrillator: Secondary | ICD-10-CM

## 2020-01-17 DIAGNOSIS — I2510 Atherosclerotic heart disease of native coronary artery without angina pectoris: Secondary | ICD-10-CM

## 2020-01-17 DIAGNOSIS — Z955 Presence of coronary angioplasty implant and graft: Secondary | ICD-10-CM

## 2020-01-17 DIAGNOSIS — Z8679 Personal history of other diseases of the circulatory system: Secondary | ICD-10-CM

## 2020-01-17 DIAGNOSIS — T148XXA Other injury of unspecified body region, initial encounter: Secondary | ICD-10-CM

## 2020-01-17 DIAGNOSIS — R55 Syncope and collapse: Secondary | ICD-10-CM

## 2020-01-17 DIAGNOSIS — I255 Ischemic cardiomyopathy: Secondary | ICD-10-CM

## 2020-01-17 DIAGNOSIS — E785 Hyperlipidemia, unspecified: Secondary | ICD-10-CM

## 2020-01-18 DIAGNOSIS — R413 Other amnesia: Secondary | ICD-10-CM

## 2020-01-19 ENCOUNTER — Encounter
Admit: 2020-01-19 | Discharge: 2020-01-19 | Payer: MEDICARE | Primary: Student in an Organized Health Care Education/Training Program

## 2020-01-19 DIAGNOSIS — E785 Hyperlipidemia, unspecified: Secondary | ICD-10-CM

## 2020-01-19 DIAGNOSIS — Z8679 Personal history of other diseases of the circulatory system: Secondary | ICD-10-CM

## 2020-01-19 DIAGNOSIS — M1990 Unspecified osteoarthritis, unspecified site: Secondary | ICD-10-CM

## 2020-01-19 DIAGNOSIS — R55 Syncope and collapse: Secondary | ICD-10-CM

## 2020-01-19 DIAGNOSIS — I10 Essential (primary) hypertension: Secondary | ICD-10-CM

## 2020-01-19 DIAGNOSIS — I2510 Atherosclerotic heart disease of native coronary artery without angina pectoris: Secondary | ICD-10-CM

## 2020-01-19 DIAGNOSIS — I255 Ischemic cardiomyopathy: Secondary | ICD-10-CM

## 2020-01-19 DIAGNOSIS — T148XXA Other injury of unspecified body region, initial encounter: Secondary | ICD-10-CM

## 2020-01-19 DIAGNOSIS — N289 Disorder of kidney and ureter, unspecified: Secondary | ICD-10-CM

## 2020-01-19 DIAGNOSIS — Z955 Presence of coronary angioplasty implant and graft: Secondary | ICD-10-CM

## 2020-01-19 DIAGNOSIS — Z95810 Presence of automatic (implantable) cardiac defibrillator: Secondary | ICD-10-CM

## 2020-03-02 ENCOUNTER — Encounter
Admit: 2020-03-02 | Discharge: 2020-03-02 | Payer: MEDICARE | Primary: Student in an Organized Health Care Education/Training Program

## 2020-03-02 NOTE — Progress Notes
Records Request    Medical records request for continuation of care:    Patient has appointment on 2.10.22   with  Dr. Ricard Dillon .    Please fax records to [place] [facility] [place]    Request records:      Recent Labs        Thank you,      Cardiovascular Medicine  [facility]  [address] [place] [address]  Phone:  [phone]  Fax:  [phone]

## 2020-03-08 NOTE — Telephone Encounter
Melone with Cardiovascular Medicine requesting pt. be scheduled to est care for an eval with [facility]. Please call [phone]. LVM [id number]

## 2020-03-09 ENCOUNTER — Encounter

## 2020-03-09 DIAGNOSIS — T148XXA Other injury of unspecified body region, initial encounter: Secondary | ICD-10-CM

## 2020-03-09 DIAGNOSIS — M1990 Unspecified osteoarthritis, unspecified site: Secondary | ICD-10-CM

## 2020-03-09 DIAGNOSIS — N289 Disorder of kidney and ureter, unspecified: Secondary | ICD-10-CM

## 2020-03-09 DIAGNOSIS — R55 Syncope and collapse: Secondary | ICD-10-CM

## 2020-03-09 DIAGNOSIS — Z95810 Presence of automatic (implantable) cardiac defibrillator: Secondary | ICD-10-CM

## 2020-03-09 DIAGNOSIS — E785 Hyperlipidemia, unspecified: Secondary | ICD-10-CM

## 2020-03-09 DIAGNOSIS — I2510 Atherosclerotic heart disease of native coronary artery without angina pectoris: Secondary | ICD-10-CM

## 2020-03-09 DIAGNOSIS — Z8679 Personal history of other diseases of the circulatory system: Secondary | ICD-10-CM

## 2020-03-09 DIAGNOSIS — I255 Ischemic cardiomyopathy: Secondary | ICD-10-CM

## 2020-03-09 DIAGNOSIS — Z955 Presence of coronary angioplasty implant and graft: Secondary | ICD-10-CM

## 2020-03-09 DIAGNOSIS — F09 Unspecified mental disorder due to known physiological condition: Secondary | ICD-10-CM

## 2020-03-09 DIAGNOSIS — I10 Essential (primary) hypertension: Secondary | ICD-10-CM

## 2020-03-09 NOTE — Assessment & Plan Note
Lab Results   Component Value Date    CHOL 124 10/28/2019    TRIG 82 10/28/2019    HDL 40 (L) 10/28/2019    LDL 77 10/28/2019    VLDL 16 10/28/2019    NONHDLCHOL 84 10/28/2019    CHOLHDLC 4 04/17/2012      LDL close to goal.

## 2020-03-09 NOTE — Assessment & Plan Note
Logan Bright is clearly unable to provide an accurate history.  I spoke to Logan Bright, a long-time friend of Logan Bright's, by telephone today.  Logan Bright is clearly unable to care for himself.  He does not have the capability to prepare meals.  He is unable to appropriately dress himself or to perform self hygiene.  His apartment is filthy.  The two ladies who have been trying to care for him out of altruism are simply exhausted and do not feel that they are able to help him any longer.    There is a daughter in [place] with whom he has had little contact for the past 20 years.  At this point I have no choice but to activate a hotline service with [facility] to activate an assessment of Logan Bright's living situation.  I am concerned that his situation is no longer safe for him and that he is inability to care for himself will cause him harm.

## 2020-03-09 NOTE — Telephone Encounter
Progress Note  SW Case Manager    Plan: Care coordination    Intervention:    SW assisted cardiology clinic with follow-up to [facility] referral. SW made out bound call [phone] to inquire about referral sent to [facility] for cognitive evaluation. [facility] reported reaching out to patient on 03/07/2020 and patient declining to schedule stating "I don't need that". [facility] agreeable to attempting one more time to schedule with patient. SW relayed information back to cardiology clinic.    SW received concerns from cardiology clinic that patient appears to lack basic hygiene, not properly dressed and could not report on the medications he is currently taking. Patient reportedly did not appear alert/ oriented during [place]. Patient reportedly took bus transportation to appointment.    SW made [place] [facility] [employer] report for patient (confirmation number [phone]) as patient is currently refusing to complete cognitive evaluation and there are ongoing concerns for patient's capacity.     Smitty Knudsen, LMSW  Outpatient Heart Failure Social Worker  Cell: [phone]  Office: [phone]

## 2020-03-09 NOTE — Progress Notes
Date of Service: 03/09/2020    Logan Bright is a 85 y.o. male.       HPI     Logan Bright was in the [facility] today for device check and for follow-up regarding heart failure.  He looks absolutely terrible.  He smells bad and his clothes are extremely dirty.    I spoke to Logan Bright, the lady whose restaurant is near his apartment in [place] city.  She has tried to care for him over the past 14 years but clearly things have gotten to the point where she is no longer able to care for him.  We have tried to pursue social services through [facility] and the only thing they have done is call the patient himself who denies the need for any services.    When I talk to Logan Bright about his home situation it is quite obvious that he does not have the cognitive function to be able to care for himself.  He has absolutely no insight into the fact that he has no independent way to get food nor is he able to keep himself clean.  I have no idea whether he is actually taking his medications as prescribed but I suspect that he is not.       Vitals:    03/09/20 1335   BP: 118/78   BP Source: Arm, Left Upper   Patient Position: Sitting   Pulse: 59   SpO2: 95%   Weight: 103 kg (227 lb)   Height: 182.9 cm (6')   PainSc: Zero     Body mass index is 30.79 kg/m?[name]     Past Medical History  Patient Active Problem List    Diagnosis Date Noted   ? Cognitive dysfunction 03/09/2020   ? Shortness of breath 10/29/2019   ? Heart failure exacerbated by sotalol (HCC) 10/27/2019   ? Heart failure (HCC) 07/01/2019   ? Acute respiratory failure with hypoxia (HCC) 07/01/2019   ? History of polycythemia 12/14/2018   ? Hyponatremia 09/04/2018   ? Hypothyroidism 09/04/2018   ? Paroxysmal atrial fibrillation (HCC) 04/07/2018   ? 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 No hemodynamically significant common or internal carotid artery stenosis.     ? Closed trimalleolar fracture of right ankle 11/12/2015   ? Debility 06/10/2013   ? Recurrent ventricular tachycardia w/ 2 VT ablations 04/2013 05/11/2013     Sustained VT w/ multiple ICD tachypacing and Shocks, failed amiodaron   05/05/13 & 05/12/13  VT ablations [facility] w/ impella support 4/15, Home off Coreg and losartan d/t post procedure hematoma, hypotension     ? 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 Ermelinda Das.        ? Ischemic cardiomyopathy      6/02 - infarct 2 stents .  [facility]            4/04 - Surveillance stress test at [facility]: reportedly negative.  4/05 - Stress thallium study: EF 30%, LAD and RCA defects, elevated lung uptake.- Echo doppler: EF 25%, amt& inf hypokinesis,  apical akinesis, poss  thrombus, mild valvular Regurg       5/06 - Stress thallium:  EF 22%, no signficant interval change.  08/2011 - Dobut echo.  EF 10-15%. Antero-apical akinesis/dyskinesis.  No ischemia.  04/2013 RegadenThall EF 23% Extensive Anterior infarct, no ischemia, unchanged from prior     ? Acute on chronic combined systolic (congestive) and diastolic (congestive) heart failure (HCC)       2003 - Status post ICD implant.    4/05 - EF 25-30% per stress thallium and echo doppler.  08/2011 - Echo EF 10-15%     ? Neurologic cardiac syncope        3/05 - Developed syncope while at restaurant, taken to ED.  Symptoms of diaphoresis                    and lightheadedness.          4/05 - Head up tilt study: positive for inducing hypotension 72/49. Produced symptoms                    c/w previous syncopal episode.     ? HTN (hypertension)                 2002 - Diagnosis established.     ? Dyslipidemia         09/26/2008  03/14/2009  02/26/2011  04/17/2012  03/31/2013   LDL 107 (H) 141 (H) 80 108 (H) 104 (H)   04/2013: Refuses Statins understands that they reduce recurrent CV events, still declines.      ? Gout    ? CKD (chronic kidney disease)      mild           Review of Systems   Constitutional: Negative.   HENT: Negative.    Eyes: Negative.    Cardiovascular: Negative.    Respiratory: Negative.    Endocrine: Negative.    Hematologic/Lymphatic: Negative.    Skin: Negative.    Musculoskeletal: Negative.    Gastrointestinal: Negative.    Genitourinary: Negative.    Neurological: Negative.    Psychiatric/Behavioral: Negative.    Allergic/Immunologic: Negative.        Physical Exam    Physical Exam   General Appearance: very disheveled, bad odor  Skin: poor hygiene  Digits and Nails: no cyanosis or clubbing, poor hygiene  Eyes: conjunctivae and lids normal, pupils are equal and round   Teeth/Gums/Palate: dentition unremarkable, no lesions   Lips & Oral Mucosa: no pallor or cyanosis   Neck Veins: normal JVP , neck veins are not distended   Thyroid: no nodules, masses, tenderness or enlargement   Chest Inspection: chest is normal in appearance   Respiratory Effort: breathing comfortably, no respiratory distress   Auscultation/Percussion: lungs clear to auscultation, no rales or rhonchi, no wheezing   PMI: PMI not enlarged or displaced   Cardiac Rhythm: regular rhythm and normal rate   Cardiac Auscultation: S1, S2 normal, no rub, no gallop   Murmurs: no murmur   Peripheral Circulation: normal peripheral circulation   Carotid Arteries: normal carotid upstroke bilaterally, no bruits   Radial Arteries: normal symmetric radial pulses   Abdominal Aorta: no abdominal aortic bruit   Pedal Pulses: normal symmetric pedal pulses   Lower Extremity Edema: no lower extremity edema   Abdominal Exam: soft, non-tender, no masses, bowel sounds normal   Liver & Spleen: no organomegaly   Gait & Station: walks without assistance   Muscle Strength: normal muscle tone   Orientation: oriented to time, place and person   Affect & Mood: appropriate  and sustained affect   Language and Memory: patient responsive and seems to comprehend information   Neurologic Exam: neurological assessment grossly intact   Other: moves all extremities        Cardiovascular Health Factors  Vitals BP Readings from Last 3 Encounters:   03/09/20 118/78   01/17/20 120/71   01/11/20 112/68     Wt Readings from Last 3 Encounters:   03/09/20 103 kg (227 lb)   01/17/20 96.2 kg (212 lb)   01/11/20 96.3 kg (212 lb 6.4 oz)     BMI Readings from Last 3 Encounters:   03/09/20 30.79 kg/m?   01/17/20 28.75 kg/m?   01/11/20 28.81 kg/m?      Smoking Social History     Tobacco Use   Smoking Status Never Smoker   Smokeless Tobacco Never Used      Lipid Profile Cholesterol   Date Value Ref Range Status   10/28/2019 124 <200 MG/DL Final     HDL   Date Value Ref Range Status   10/28/2019 40 (L) >40 MG/DL Final     LDL   Date Value Ref Range Status   10/28/2019 77 <100 mg/dL Final     Triglycerides   Date Value Ref Range Status   10/28/2019 82 <150 MG/DL Final      Blood Sugar Hemoglobin A1C   Date Value Ref Range Status   10/28/2019 6.6 (H) 4.0 - 6.0 % Final     Comment:     The ADA recommends that most patients with type 1 and type 2 diabetes maintain   an A1c level <7%.       Glucose   Date Value Ref Range Status   01/11/2020 104  Final   11/08/2019 101 (H) 70 - 100 MG/DL Final   09/81/1914 96 70 - 100 MG/DL Final     Glucose, POC   Date Value Ref Range Status   10/04/2019 116 (H) 70 - 100 MG/DL Final   78/29/5621 308 (H) 70 - 100 MG/DL Final          Problems Addressed Today  Encounter Diagnoses   Name Primary?   ? Automatic implantable cardioverter-defibrillator in situ    ? Dyslipidemia    ? Ischemic cardiomyopathy    ? Cognitive dysfunction        Assessment and Plan       Automatic implantable cardioverter-defibrillator in situ  Device check in clinic today shows over 98% biventricular pacing.  He has not required any therapies for tachycardia.    Dyslipidemia  Lab Results Component Value Date    CHOL 124 10/28/2019    TRIG 82 10/28/2019    HDL 40 (L) 10/28/2019    LDL 77 10/28/2019    VLDL 16 10/28/2019    NONHDLCHOL 84 10/28/2019    CHOLHDLC 4 04/17/2012      LDL close to goal.    Ischemic cardiomyopathy  Today's device check suggests that he is in a cycle of fluid retention.  He does have somewhat increased abdominal girth but no complaint of breathlessness.    Cognitive dysfunction  Logan Bright is clearly unable to provide an accurate history.  I spoke to Norwood Levo Bright, a long-time friend of [name]'s, by telephone today.  Logan Bright is clearly unable to care for himself.  He does not have the capability to prepare meals.  He is unable to appropriately dress himself or to perform self hygiene.  His apartment is filthy.  The two ladies who  have been trying to care for him out of altruism are simply exhausted and do not feel that they are able to help him any longer.    There is a daughter in [place] with whom he has had little contact for the past 20 years.  At this point I have no choice but to activate a hotline service with [facility] to activate an assessment of [name]'s living situation.  I am concerned that his situation is no longer safe for him and that he is inability to care for himself will cause him harm.      Current Medications (including today's revisions)  ? allopurinoL (ZYLOPRIM) 100 mg tablet TAKE 1 TABLET BY MOUTH EVERY NIGHT AT BEDTIME WITH FOOD   ? amiodarone (CORDARONE) 200 mg tablet TAKE 1 TABLET BY MOUTH EVERY DAY WITH FOOD   ? aspirin 325 mg tablet Take 325 mg by mouth daily. Take with food.   ? docusate (COLACE) 100 mg capsule Take 100 mg by mouth twice daily.   ? ergocalciferol (VITAMIN D-2) 1,250 mcg (50,000 unit) capsule TAKE 1 CAPSULE BY MOUTH 1 TIME A WEEK   ? furosemide (LASIX) 40 mg tablet Take two tablets by mouth daily.   ? levothyroxine (SYNTHROID) 50 mcg tablet TAKE 1 TABLET BY MOUTH EVERY DAY 30 MINUTES BEFORE BREAKFAST   ? metoprolol XL (TOPROL XL) 25 mg extended release tablet Take one-half tablet by mouth daily.     Total time spent on today's office visit was 30 minutes.  This includes face-to-face in person visit with patient as well as nonface-to-face time including review of the EMR, outside records, labs, radiologic studies, echocardiogram & other cardiovascular studies, formation of treatment plan, after visit summary, future disposition, and lastly on documentation.

## 2020-03-09 NOTE — Assessment & Plan Note
Today's device check suggests that he is in a cycle of fluid retention.  He does have somewhat increased abdominal girth but no complaint of breathlessness.

## 2020-03-09 NOTE — Assessment & Plan Note
Device check in clinic today shows over 98% biventricular pacing.  He has not required any therapies for tachycardia.

## 2020-03-13 NOTE — Telephone Encounter
Pt's daughter, Leeanne Mannan [phone], called and stated that she would like to discuss patient. She stated that the patient was no longer having [name] Minor be his DPOA as they had a falling out. She stated that she has been made DPOA, but she does not live in town. She stated that she heard at the patient's last visit with the cardiologist that he was concerned. OV note by Dr. Ricard Dillon states the patient looked terrible, smells bad, and clothes were dirty. Notes state that "When I talk to Barnabas Lister about his home situation it is quite obvious that he does not have the cognitive function to be able to care for himself.  He has absolutely no insight into the fact that he has no independent way to get food nor is he able to keep himself clean.  I have no idea whether he is actually taking his medications as prescribed but I suspect that he is not". It appears that there is a Education officer, museum note from 03/09/20 as well where they tried to reach out and patient declined scheduling with [facility]. SW made [place] APS as well.    She stated that she hasn't had a relationship with her father until 3 years ago. She stated that he refused to let her come and clean. Coralyn Helling is concerned that he will have a fall at home and not care for himself. She stated that he refuses home health and anyone to come into the home and help. She stated that he refuses to go to a nursing home. She stated that sister doesn't have anything to do with her father.    I stated I would notify Dr. Ricard Dillon and also see if social worker has any other thoughts.

## 2020-03-23 ENCOUNTER — Encounter
Admit: 2020-03-23 | Discharge: 2020-03-23 | Payer: MEDICARE | Primary: Student in an Organized Health Care Education/Training Program

## 2020-03-23 DIAGNOSIS — Z959 Presence of cardiac and vascular implant and graft, unspecified: Secondary | ICD-10-CM

## 2020-03-23 DIAGNOSIS — Z95810 Presence of automatic (implantable) cardiac defibrillator: Secondary | ICD-10-CM

## 2020-03-24 ENCOUNTER — Encounter
Admit: 2020-03-24 | Discharge: 2020-03-24 | Payer: MEDICARE | Primary: Student in an Organized Health Care Education/Training Program

## 2020-03-24 NOTE — Telephone Encounter
Called to check patient symptoms. Patient denies any symptoms of shortness of breath, edema, swelling, or fatigue. Patient verbalizes understanding and will call the office if symptoms arise.

## 2020-03-24 NOTE — Telephone Encounter
-----   Message from Nichola Sizer, RN sent at 03/23/2020  4:30 PM CST -----  Regarding: FW: SDO Possible fluid retention    ----- Message -----  From: Richardine Service, RN  Sent: 03/23/2020   4:29 PM CST  To: Cvm Nurse Gen Card Team Red  Subject: SDO Possible fluid retention                     03/22/20 remote suggests Possible ongoing fluid retention since 03/08/20.     Please see transmission and follow up with any concerns.    Thanks,   Brittney/device team

## 2020-03-27 ENCOUNTER — Encounter
Admit: 2020-03-27 | Discharge: 2020-03-27 | Payer: MEDICARE | Primary: Student in an Organized Health Care Education/Training Program

## 2020-03-27 MED ORDER — AMIODARONE 200 MG PO TAB
200 mg | ORAL_TABLET | Freq: Every day | ORAL | 3 refills | 42.00000 days | Status: AC
Start: 2020-03-27 — End: ?

## 2020-03-27 MED ORDER — METOPROLOL SUCCINATE 25 MG PO TB24
12.5 mg | ORAL_TABLET | Freq: Every day | ORAL | 3 refills | 90.00000 days | Status: AC
Start: 2020-03-27 — End: ?

## 2020-03-27 MED ORDER — FUROSEMIDE 40 MG PO TAB
80 mg | ORAL_TABLET | Freq: Every day | ORAL | 3 refills | 90.00000 days | Status: AC
Start: 2020-03-27 — End: ?

## 2020-03-29 ENCOUNTER — Emergency Department: Admit: 2020-03-29 | Discharge: 2020-03-29 | Payer: MEDICARE

## 2020-03-29 ENCOUNTER — Encounter
Admit: 2020-03-29 | Discharge: 2020-03-29 | Payer: MEDICARE | Primary: Student in an Organized Health Care Education/Training Program

## 2020-03-29 ENCOUNTER — Inpatient Hospital Stay: Admit: 2020-03-29 | Payer: MEDICARE

## 2020-03-29 DIAGNOSIS — I509 Heart failure, unspecified: Secondary | ICD-10-CM

## 2020-03-29 DIAGNOSIS — Z955 Presence of coronary angioplasty implant and graft: Secondary | ICD-10-CM

## 2020-03-29 DIAGNOSIS — E785 Hyperlipidemia, unspecified: Secondary | ICD-10-CM

## 2020-03-29 DIAGNOSIS — R627 Adult failure to thrive: Secondary | ICD-10-CM

## 2020-03-29 DIAGNOSIS — Z95810 Presence of automatic (implantable) cardiac defibrillator: Secondary | ICD-10-CM

## 2020-03-29 DIAGNOSIS — I2510 Atherosclerotic heart disease of native coronary artery without angina pectoris: Secondary | ICD-10-CM

## 2020-03-29 DIAGNOSIS — M1990 Unspecified osteoarthritis, unspecified site: Secondary | ICD-10-CM

## 2020-03-29 DIAGNOSIS — I5043 Acute on chronic combined systolic (congestive) and diastolic (congestive) heart failure: Secondary | ICD-10-CM

## 2020-03-29 DIAGNOSIS — T148XXA Other injury of unspecified body region, initial encounter: Secondary | ICD-10-CM

## 2020-03-29 DIAGNOSIS — Z8679 Personal history of other diseases of the circulatory system: Secondary | ICD-10-CM

## 2020-03-29 DIAGNOSIS — I255 Ischemic cardiomyopathy: Secondary | ICD-10-CM

## 2020-03-29 DIAGNOSIS — N289 Disorder of kidney and ureter, unspecified: Secondary | ICD-10-CM

## 2020-03-29 DIAGNOSIS — I10 Essential (primary) hypertension: Secondary | ICD-10-CM

## 2020-03-29 DIAGNOSIS — R55 Syncope and collapse: Secondary | ICD-10-CM

## 2020-03-29 LAB — POC TROPONIN: Lab: 0 ng/mL (ref 0.00–0.05)

## 2020-03-29 LAB — COMPREHENSIVE METABOLIC PANEL
Lab: 1.6 mg/dL — ABNORMAL HIGH (ref 0.3–1.2)
Lab: 1.8 mg/dL — ABNORMAL HIGH (ref 0.4–1.24)
Lab: 105 MMOL/L — ABNORMAL HIGH (ref 98–110)
Lab: 127 U/L — ABNORMAL HIGH (ref 25–110)
Lab: 139 MMOL/L — ABNORMAL HIGH (ref 137–147)
Lab: 14 K/UL — ABNORMAL HIGH (ref 3–12)
Lab: 14 U/L (ref 7–56)
Lab: 20 MMOL/L — ABNORMAL LOW (ref 21–30)
Lab: 23 U/L (ref 7–40)
Lab: 3.3 g/dL — ABNORMAL LOW (ref 3.5–5.0)
Lab: 40 mg/dL — ABNORMAL HIGH (ref 7–25)
Lab: 5.3 MMOL/L — ABNORMAL HIGH (ref 3.5–5.1)
Lab: 6.3 g/dL (ref 6.0–8.0)
Lab: 8.2 mg/dL — ABNORMAL LOW (ref 8.5–10.6)
Lab: 91 mg/dL (ref 70–100)

## 2020-03-29 LAB — CBC AND DIFF
Lab: 0 K/UL (ref 0–0.20)
Lab: 0 K/UL (ref 0–0.45)
Lab: 0.5 K/UL (ref 0–0.80)
Lab: 21 — ABNORMAL HIGH (ref <20.7)
Lab: 5.4 K/UL (ref 4.5–11.0)

## 2020-03-29 LAB — BNP POC ER: Lab: 161 pg/mL — ABNORMAL HIGH (ref 0–100)

## 2020-03-29 MED ORDER — NYSTATIN 100,000 UNIT/GRAM TP POWD
Freq: Two times a day (BID) | TOPICAL | 0 refills | Status: AC
Start: 2020-03-29 — End: ?

## 2020-03-29 MED ORDER — FUROSEMIDE 10 MG/ML IJ SOLN
40 mg | Freq: Once | INTRAVENOUS | 0 refills | Status: CP
Start: 2020-03-29 — End: ?
  Administered 2020-03-30: 06:00:00 40 mg via INTRAVENOUS

## 2020-03-30 ENCOUNTER — Encounter
Admit: 2020-03-30 | Discharge: 2020-03-30 | Payer: MEDICARE | Primary: Student in an Organized Health Care Education/Training Program

## 2020-03-30 ENCOUNTER — Inpatient Hospital Stay
Admit: 2020-03-30 | Discharge: 2020-03-30 | Payer: MEDICARE | Primary: Student in an Organized Health Care Education/Training Program

## 2020-03-30 MED ADMIN — DOCUSATE SODIUM 100 MG PO CAP [2566]: 100 mg | ORAL | @ 09:00:00 | NDC 00904699880

## 2020-03-30 MED ADMIN — LEVOTHYROXINE 25 MCG PO TAB [4420]: 50 ug | ORAL | @ 12:00:00 | NDC 51079044401

## 2020-03-30 MED ADMIN — THIAMINE MONONITRATE (VIT B1) 100 MG PO TAB [303868]: 100 mg | ORAL | @ 09:00:00 | NDC 50268085111

## 2020-03-30 MED ADMIN — ASPIRIN 325 MG PO TAB [681]: 325 mg | ORAL | @ 14:00:00 | NDC 66553000101

## 2020-03-30 MED ADMIN — METOPROLOL SUCCINATE 25 MG PO TB24 [81866]: 12.5 mg | ORAL | @ 14:00:00 | NDC 00904632261

## 2020-03-30 MED ADMIN — METRONIDAZOLE IN NACL (ISO-OS) 500 MG/100 ML IV PGBK [5018]: 500 mg | INTRAVENOUS | @ 10:00:00 | Stop: 2020-03-30 | NDC 00338105548

## 2020-03-30 MED ADMIN — CEFTRIAXONE INJ 1GM IVP [210253]: 1 g | INTRAVENOUS | @ 09:00:00 | Stop: 2020-03-30 | NDC 00409733211

## 2020-03-30 MED ADMIN — NYSTATIN 100,000 UNIT/GRAM TP POWD [39136]: TOPICAL | @ 12:00:00 | NDC 00832046515

## 2020-03-30 MED ADMIN — METRONIDAZOLE IN NACL (ISO-OS) 500 MG/100 ML IV PGBK [5018]: 500 mg | INTRAVENOUS | @ 17:00:00 | Stop: 2020-03-30 | NDC 00338105548

## 2020-03-30 MED ADMIN — FUROSEMIDE 10 MG/ML IJ SOLN [3291]: 60 mg | INTRAVENOUS | @ 14:00:00 | Stop: 2020-03-30 | NDC 25021031102

## 2020-03-30 MED ADMIN — PERFLUTREN LIPID MICROSPHERES 1.1 MG/ML IV SUSP [79178]: 2 mL | INTRAVENOUS | @ 17:00:00 | Stop: 2020-03-30 | NDC 11994001116

## 2020-03-30 MED ADMIN — DOCUSATE SODIUM 100 MG PO CAP [2566]: 100 mg | ORAL | @ 14:00:00 | NDC 00904699880

## 2020-03-30 MED ADMIN — FUROSEMIDE 10 MG/ML IJ SOLN [3291]: 20 mg | INTRAVENOUS | @ 09:00:00 | Stop: 2020-03-30 | NDC 63323028001

## 2020-03-30 MED ADMIN — BUMETANIDE 0.25 MG/ML IJ SOLN [9308]: 2 mg | INTRAVENOUS | @ 23:00:00 | NDC 00641600801

## 2020-03-30 NOTE — ED Notes
Medtronic pacemaker interrogated

## 2020-03-30 NOTE — ED Notes
Pt's friend Coralyn Helling Minor states she is pt's power of attorney and can be reached @ [phone]

## 2020-03-30 NOTE — ED Notes
85 yr old male presents to ED39 with CC of fatigue. Pt states he has been getting tired easily for the last 3 weeks. Pt denies SOA and CP. Pt presents w/ bilateral lower extremity edema, states he has had swelling in his legs "for more than one year." Pt states he has a Medtronic defibrillator. Pt is AOx4, skin p/w/d, & eupneic on RA. Bed in low, locked position w/ side rails up. Pt placed on monitor. Call light & belongings within reach.    Belongings: shirt, coat, pants, socks, shoes

## 2020-03-31 MED ADMIN — THIAMINE MONONITRATE (VIT B1) 100 MG PO TAB [303868]: 100 mg | ORAL | @ 15:00:00 | NDC 50268085111

## 2020-03-31 MED ADMIN — BUMETANIDE 0.25 MG/ML IJ SOLN [9308]: 2 mg | INTRAVENOUS | @ 15:00:00 | NDC 00641600801

## 2020-03-31 MED ADMIN — CEFTRIAXONE INJ 1GM IVP [210253]: 2 g | INTRAVENOUS | @ 15:00:00 | NDC 00409733211

## 2020-03-31 MED ADMIN — DOCUSATE SODIUM 100 MG PO CAP [2566]: 100 mg | ORAL | @ 15:00:00 | NDC 00904699880

## 2020-03-31 MED ADMIN — AMIODARONE 200 MG PO TAB [9066]: 200 mg | ORAL | @ 15:00:00 | NDC 00904699361

## 2020-03-31 MED ADMIN — BUMETANIDE 0.25 MG/ML IJ SOLN [9308]: 2 mg | INTRAVENOUS | @ 23:00:00 | NDC 70860040541

## 2020-03-31 MED ADMIN — METOPROLOL SUCCINATE 25 MG PO TB24 [81866]: 12.5 mg | ORAL | @ 15:00:00 | NDC 00904632261

## 2020-03-31 MED ADMIN — METRONIDAZOLE 500 MG PO TAB [5016]: 500 mg | ORAL | @ 15:00:00 | NDC 60687055011

## 2020-03-31 MED ADMIN — DOCUSATE SODIUM 100 MG PO CAP [2566]: 100 mg | ORAL | @ 03:00:00 | NDC 00904699880

## 2020-03-31 MED ADMIN — KETOCONAZOLE 2 % TP CREA [10368]: TOPICAL | @ 03:00:00 | NDC 00168009915

## 2020-03-31 MED ADMIN — METRONIDAZOLE 500 MG PO TAB [5016]: 500 mg | ORAL | @ 03:00:00 | NDC 60687055011

## 2020-03-31 MED ADMIN — BUMETANIDE 0.25 MG/ML IJ SOLN [9308]: 2 mg | INTRAVENOUS | @ 23:00:00 | NDC 00641600801

## 2020-03-31 MED ADMIN — ASPIRIN 325 MG PO TAB [681]: 325 mg | ORAL | @ 15:00:00 | NDC 66553000101

## 2020-03-31 MED ADMIN — HEPARIN, PORCINE (PF) 5,000 UNIT/0.5 ML IJ SYRG [95535]: 5000 [IU] | SUBCUTANEOUS | @ 04:00:00 | NDC 00409131611

## 2020-03-31 MED ADMIN — HEPARIN, PORCINE (PF) 5,000 UNIT/0.5 ML IJ SYRG [95535]: 5000 [IU] | SUBCUTANEOUS | @ 12:00:00 | NDC 00409131611

## 2020-03-31 MED ADMIN — HEPARIN, PORCINE (PF) 5,000 UNIT/0.5 ML IJ SYRG [95535]: 5000 [IU] | SUBCUTANEOUS | @ 19:00:00 | NDC 00409131611

## 2020-03-31 MED ADMIN — ALLOPURINOL 100 MG PO TAB [310]: 100 mg | ORAL | @ 03:00:00 | NDC 51079020501

## 2020-03-31 MED ADMIN — LEVOTHYROXINE 25 MCG PO TAB [4420]: 50 ug | ORAL | @ 12:00:00 | NDC 51079044401

## 2020-04-01 MED ADMIN — AMIODARONE 200 MG PO TAB [9066]: 200 mg | ORAL | @ 16:00:00 | NDC 00904699361

## 2020-04-01 MED ADMIN — HEPARIN, PORCINE (PF) 5,000 UNIT/0.5 ML IJ SYRG [95535]: 5000 [IU] | SUBCUTANEOUS | @ 21:00:00 | NDC 00409131611

## 2020-04-01 MED ADMIN — HEPARIN, PORCINE (PF) 5,000 UNIT/0.5 ML IJ SYRG [95535]: 5000 [IU] | SUBCUTANEOUS | @ 04:00:00 | NDC 00409131611

## 2020-04-01 MED ADMIN — HEPARIN, PORCINE (PF) 5,000 UNIT/0.5 ML IJ SYRG [95535]: 5000 [IU] | SUBCUTANEOUS | @ 13:00:00 | NDC 00409131611

## 2020-04-01 MED ADMIN — DOCUSATE SODIUM 100 MG PO CAP [2566]: 100 mg | ORAL | @ 16:00:00 | NDC 00904699880

## 2020-04-01 MED ADMIN — DAPTOMYCIN SYR [212028]: 500 mg | INTRAVENOUS | @ 03:00:00 | NDC 63323087115

## 2020-04-01 MED ADMIN — METRONIDAZOLE 500 MG PO TAB [5016]: 500 mg | ORAL | @ 16:00:00 | NDC 60687055011

## 2020-04-01 MED ADMIN — METOPROLOL SUCCINATE 25 MG PO TB24 [81866]: 12.5 mg | ORAL | @ 16:00:00 | NDC 00904632261

## 2020-04-01 MED ADMIN — METRONIDAZOLE 500 MG PO TAB [5016]: 500 mg | ORAL | @ 03:00:00 | NDC 60687055011

## 2020-04-01 MED ADMIN — DOCUSATE SODIUM 100 MG PO CAP [2566]: 100 mg | ORAL | @ 03:00:00 | NDC 00904699880

## 2020-04-01 MED ADMIN — ASPIRIN 325 MG PO TAB [681]: 325 mg | ORAL | @ 16:00:00 | NDC 66553000101

## 2020-04-01 MED ADMIN — CEFTRIAXONE INJ 1GM IVP [210253]: 2 g | INTRAVENOUS | @ 16:00:00 | NDC 00409733211

## 2020-04-01 MED ADMIN — BUMETANIDE 0.25 MG/ML IJ SOLN [9308]: 3 mg | INTRAVENOUS | @ 16:00:00 | NDC 00641600801

## 2020-04-01 MED ADMIN — SPIRONOLACTONE 25 MG PO TAB [7437]: 12.5 mg | ORAL | @ 16:00:00 | NDC 63739054410

## 2020-04-01 MED ADMIN — THIAMINE MONONITRATE (VIT B1) 100 MG PO TAB [303868]: 100 mg | ORAL | @ 16:00:00 | NDC 50268085111

## 2020-04-01 MED ADMIN — POTASSIUM CHLORIDE 10 MEQ PO TBTQ [35942]: 10 meq | ORAL | @ 16:00:00 | Stop: 2020-04-01 | NDC 68084063211

## 2020-04-01 MED ADMIN — ALLOPURINOL 100 MG PO TAB [310]: 100 mg | ORAL | @ 03:00:00 | NDC 51079020501

## 2020-04-01 MED ADMIN — LEVOTHYROXINE 25 MCG PO TAB [4420]: 50 ug | ORAL | @ 13:00:00 | NDC 51079044401

## 2020-04-01 MED ADMIN — CHLOROTHIAZIDE SODIUM 500 MG IV SOLR [81318]: 250 mg | INTRAVENOUS | Stop: 2020-04-01 | NDC 17478041940

## 2020-04-01 MED ADMIN — WATER FOR INJECTION, STERILE IJ SOLN [79513]: 9 mL | INTRAVENOUS | Stop: 2020-04-01 | NDC 00409488723

## 2020-04-02 MED ADMIN — DAPTOMYCIN SYR [212028]: 500 mg | INTRAVENOUS | @ 03:00:00 | NDC 63323087115

## 2020-04-02 MED ADMIN — THIAMINE MONONITRATE (VIT B1) 100 MG PO TAB [303868]: 100 mg | ORAL | @ 16:00:00 | NDC 50268085111

## 2020-04-02 MED ADMIN — HEPARIN, PORCINE (PF) 5,000 UNIT/0.5 ML IJ SYRG [95535]: 5000 [IU] | SUBCUTANEOUS | @ 03:00:00 | NDC 00409131611

## 2020-04-02 MED ADMIN — SPIRONOLACTONE 25 MG PO TAB [7437]: 25 mg | ORAL | @ 16:00:00 | NDC 63739054410

## 2020-04-02 MED ADMIN — POTASSIUM CHLORIDE 20 MEQ PO TBTQ [35943]: 40 meq | ORAL | @ 16:00:00 | Stop: 2020-04-02 | NDC 00245531989

## 2020-04-02 MED ADMIN — ALLOPURINOL 100 MG PO TAB [310]: 100 mg | ORAL | @ 03:00:00 | NDC 51079020501

## 2020-04-02 MED ADMIN — CHLOROTHIAZIDE SODIUM 500 MG IV SOLR [81318]: 250 mg | INTRAVENOUS | @ 22:00:00 | Stop: 2020-04-02 | NDC 17478041940

## 2020-04-02 MED ADMIN — ASPIRIN 325 MG PO TAB [681]: 325 mg | ORAL | @ 16:00:00 | NDC 66553000101

## 2020-04-02 MED ADMIN — BUMETANIDE 0.25 MG/ML IJ SOLN [9308]: 3 mg | INTRAVENOUS | @ 01:00:00 | NDC 00409141234

## 2020-04-02 MED ADMIN — BUMETANIDE 0.25 MG/ML IJ SOLN [9308]: 3 mg | INTRAVENOUS | @ 16:00:00 | NDC 00641600801

## 2020-04-02 MED ADMIN — METRONIDAZOLE 500 MG PO TAB [5016]: 500 mg | ORAL | @ 03:00:00 | NDC 60687055011

## 2020-04-02 MED ADMIN — AMIODARONE 200 MG PO TAB [9066]: 200 mg | ORAL | @ 16:00:00 | NDC 00904699361

## 2020-04-02 MED ADMIN — HEPARIN, PORCINE (PF) 5,000 UNIT/0.5 ML IJ SYRG [95535]: 5000 [IU] | SUBCUTANEOUS | @ 12:00:00 | NDC 00409131611

## 2020-04-02 MED ADMIN — HEPARIN, PORCINE (PF) 5,000 UNIT/0.5 ML IJ SYRG [95535]: 5000 [IU] | SUBCUTANEOUS | @ 22:00:00 | NDC 00409131611

## 2020-04-02 MED ADMIN — DOCUSATE SODIUM 100 MG PO CAP [2566]: 100 mg | ORAL | @ 16:00:00 | NDC 00904699880

## 2020-04-02 MED ADMIN — LEVOTHYROXINE 25 MCG PO TAB [4420]: 50 ug | ORAL | @ 12:00:00 | NDC 51079044401

## 2020-04-02 MED ADMIN — WATER FOR INJECTION, STERILE IJ SOLN [79513]: 20 mL | INTRAVENOUS | @ 22:00:00 | Stop: 2020-04-02 | NDC 00409488723

## 2020-04-02 MED ADMIN — DOCUSATE SODIUM 100 MG PO CAP [2566]: 100 mg | ORAL | @ 03:00:00 | NDC 00904699880

## 2020-04-02 MED ADMIN — METRONIDAZOLE 500 MG PO TAB [5016]: 500 mg | ORAL | @ 16:00:00 | NDC 60687055011

## 2020-04-02 MED ADMIN — POTASSIUM CHLORIDE 20 MEQ PO TBTQ [35943]: 40 meq | ORAL | @ 22:00:00 | Stop: 2020-04-02 | NDC 00245531989

## 2020-04-02 MED ADMIN — METOPROLOL SUCCINATE 25 MG PO TB24 [81866]: 12.5 mg | ORAL | @ 16:00:00 | NDC 00904632261

## 2020-04-02 MED ADMIN — BUMETANIDE 0.25 MG/ML IJ SOLN [9308]: 3 mg | INTRAVENOUS | NDC 70860040541

## 2020-04-02 MED ADMIN — CEFTRIAXONE INJ 1GM IVP [210253]: 2 g | INTRAVENOUS | @ 16:00:00 | NDC 00409733211

## 2020-04-03 ENCOUNTER — Inpatient Hospital Stay
Admit: 2020-04-03 | Discharge: 2020-04-03 | Payer: MEDICARE | Primary: Student in an Organized Health Care Education/Training Program

## 2020-04-03 MED ADMIN — HEPARIN, PORCINE (PF) 5,000 UNIT/0.5 ML IJ SYRG [95535]: 5000 [IU] | SUBCUTANEOUS | @ 22:00:00 | NDC 00409131611

## 2020-04-03 MED ADMIN — BUMETANIDE 0.25 MG/ML IJ SOLN [9308]: 3 mg | INTRAVENOUS | @ 14:00:00 | Stop: 2020-04-03 | NDC 00409141234

## 2020-04-03 MED ADMIN — POTASSIUM CHLORIDE 20 MEQ PO TBTQ [35943]: 60 meq | ORAL | @ 03:00:00 | Stop: 2020-04-03 | NDC 00245531989

## 2020-04-03 MED ADMIN — CHLOROTHIAZIDE SODIUM 500 MG IV SOLR [81318]: 250 mg | INTRAVENOUS | @ 03:00:00 | Stop: 2020-04-03 | NDC 17478041940

## 2020-04-03 MED ADMIN — ASPIRIN 325 MG PO TAB [681]: 325 mg | ORAL | @ 14:00:00 | NDC 66553000101

## 2020-04-03 MED ADMIN — HEPARIN, PORCINE (PF) 5,000 UNIT/0.5 ML IJ SYRG [95535]: 5000 [IU] | SUBCUTANEOUS | @ 03:00:00 | NDC 00409131611

## 2020-04-03 MED ADMIN — THIAMINE MONONITRATE (VIT B1) 100 MG PO TAB [303868]: 100 mg | ORAL | @ 14:00:00 | NDC 50268085111

## 2020-04-03 MED ADMIN — METRONIDAZOLE 500 MG PO TAB [5016]: 500 mg | ORAL | @ 14:00:00 | NDC 60687055011

## 2020-04-03 MED ADMIN — ALLOPURINOL 100 MG PO TAB [310]: 100 mg | ORAL | @ 03:00:00 | NDC 51079020501

## 2020-04-03 MED ADMIN — SPIRONOLACTONE 25 MG PO TAB [7437]: 25 mg | ORAL | @ 14:00:00 | NDC 63739054410

## 2020-04-03 MED ADMIN — SENNOSIDES-DOCUSATE SODIUM 8.6-50 MG PO TAB [40926]: 1 | ORAL | @ 17:00:00 | NDC 60687062211

## 2020-04-03 MED ADMIN — METOPROLOL SUCCINATE 25 MG PO TB24 [81866]: 12.5 mg | ORAL | @ 14:00:00 | NDC 00904632261

## 2020-04-03 MED ADMIN — CEFTRIAXONE INJ 1GM IVP [210253]: 2 g | INTRAVENOUS | @ 14:00:00 | NDC 00409733211

## 2020-04-03 MED ADMIN — AMIODARONE 200 MG PO TAB [9066]: 200 mg | ORAL | @ 14:00:00 | NDC 00904699361

## 2020-04-03 MED ADMIN — DAPTOMYCIN SYR [212028]: 500 mg | INTRAVENOUS | @ 01:00:00 | NDC 63323087115

## 2020-04-03 MED ADMIN — LEVOTHYROXINE 25 MCG PO TAB [4420]: 50 ug | ORAL | @ 12:00:00 | NDC 51079044401

## 2020-04-03 MED ADMIN — METRONIDAZOLE 500 MG PO TAB [5016]: 500 mg | ORAL | @ 03:00:00 | NDC 60687055011

## 2020-04-03 MED ADMIN — HEPARIN, PORCINE (PF) 5,000 UNIT/0.5 ML IJ SYRG [95535]: 5000 [IU] | SUBCUTANEOUS | @ 12:00:00 | NDC 00409131611

## 2020-04-03 MED ADMIN — WATER FOR INJECTION, STERILE IJ SOLN [79513]: 20 mL | INTRAVENOUS | @ 03:00:00 | Stop: 2020-04-03 | NDC 00409488723

## 2020-04-03 MED ADMIN — POLYETHYLENE GLYCOL 3350 17 GRAM PO PWPK [25424]: 17 g | ORAL | @ 17:00:00 | NDC 00904693186

## 2020-04-03 MED ADMIN — BUMETANIDE 1 MG PO TAB [9310]: 3 mg | ORAL | Stop: 2020-04-03 | NDC 50268013111

## 2020-04-04 ENCOUNTER — Encounter
Admit: 2020-04-04 | Discharge: 2020-04-04 | Payer: MEDICARE | Primary: Student in an Organized Health Care Education/Training Program

## 2020-04-04 MED ADMIN — METRONIDAZOLE 500 MG PO TAB [5016]: 500 mg | ORAL | @ 16:00:00 | Stop: 2020-04-05 | NDC 60687055011

## 2020-04-04 MED ADMIN — AMIODARONE 200 MG PO TAB [9066]: 200 mg | ORAL | @ 16:00:00 | NDC 00904699361

## 2020-04-04 MED ADMIN — METOPROLOL SUCCINATE 25 MG PO TB24 [81866]: 12.5 mg | ORAL | @ 16:00:00 | NDC 00904632261

## 2020-04-04 MED ADMIN — LINEZOLID 600 MG PO TAB [80084]: 600 mg | ORAL | @ 16:00:00 | NDC 00904655304

## 2020-04-04 MED ADMIN — HEPARIN, PORCINE (PF) 5,000 UNIT/0.5 ML IJ SYRG [95535]: 5000 [IU] | SUBCUTANEOUS | @ 05:00:00 | NDC 00409131611

## 2020-04-04 MED ADMIN — LEVOTHYROXINE 25 MCG PO TAB [4420]: 50 ug | ORAL | @ 12:00:00 | NDC 51079044401

## 2020-04-04 MED ADMIN — CEFTRIAXONE INJ 1GM IVP [210253]: 2 g | INTRAVENOUS | @ 16:00:00 | Stop: 2020-04-04 | NDC 00409733211

## 2020-04-04 MED ADMIN — BUMETANIDE 1 MG PO TAB [9310]: 3 mg | ORAL | @ 16:00:00 | NDC 50268013111

## 2020-04-04 MED ADMIN — ALLOPURINOL 100 MG PO TAB [310]: 100 mg | ORAL | @ 03:00:00 | NDC 51079020501

## 2020-04-04 MED ADMIN — HEPARIN, PORCINE (PF) 5,000 UNIT/0.5 ML IJ SYRG [95535]: 5000 [IU] | SUBCUTANEOUS | @ 12:00:00 | NDC 00409131611

## 2020-04-04 MED ADMIN — SPIRONOLACTONE 25 MG PO TAB [7437]: 25 mg | ORAL | @ 16:00:00 | NDC 63739054410

## 2020-04-04 MED ADMIN — THIAMINE MONONITRATE (VIT B1) 100 MG PO TAB [303868]: 100 mg | ORAL | @ 16:00:00 | NDC 50268085111

## 2020-04-04 MED ADMIN — ASPIRIN 325 MG PO TAB [681]: 325 mg | ORAL | @ 16:00:00 | NDC 66553000101

## 2020-04-04 MED ADMIN — BUMETANIDE 1 MG PO TAB [9310]: 3 mg | ORAL | @ 23:00:00 | NDC 50268013111

## 2020-04-04 MED ADMIN — METRONIDAZOLE 500 MG PO TAB [5016]: 500 mg | ORAL | @ 03:00:00 | NDC 60687055011

## 2020-04-04 MED ADMIN — HEPARIN, PORCINE (PF) 5,000 UNIT/0.5 ML IJ SYRG [95535]: 5000 [IU] | SUBCUTANEOUS | @ 21:00:00 | NDC 00409131611

## 2020-04-04 MED ADMIN — DAPTOMYCIN SYR [212028]: 500 mg | INTRAVENOUS | @ 01:00:00 | Stop: 2020-04-04 | NDC 43598041311

## 2020-04-04 NOTE — Telephone Encounter
Overmyer, Patrcia Dolly, Abby J, APRN-NP; P Cvm Gen Card Scheduling - [place]  Cc: P Cvm Nurse [place]/[facility] nurses anyway we can get this patient in with [facility] in on 3/17 or 4/14? Supposed to be a two week F/U?       ----- Message -----   From: Marnee Guarneri, APRN-NP   Sent: 04/04/2020  1:34 PM CST   To: Cvm Gen Card Scheduling - , *   Subject: Follow up appointment                 Needs post hospital follow up with Dr. Ricard Dillon in [facility]. Currently admitted with HF; however, Ricard Dillon saw him prior to admission. Would like to have him scheduled back in about 2 weeks if possible. Please let me know if you have questions.

## 2020-04-05 MED ADMIN — LINEZOLID 600 MG PO TAB [80084]: 600 mg | ORAL | @ 16:00:00 | Stop: 2020-04-07 | NDC 00904655304

## 2020-04-05 MED ADMIN — ALLOPURINOL 100 MG PO TAB [310]: 100 mg | ORAL | @ 03:00:00 | NDC 51079020501

## 2020-04-05 MED ADMIN — HEPARIN, PORCINE (PF) 5,000 UNIT/0.5 ML IJ SYRG [95535]: 5000 [IU] | SUBCUTANEOUS | @ 12:00:00 | NDC 00409131611

## 2020-04-05 MED ADMIN — LINEZOLID 600 MG PO TAB [80084]: 600 mg | ORAL | @ 03:00:00 | NDC 00904655304

## 2020-04-05 MED ADMIN — AMOXICILLIN-POT CLAVULANATE 875-125 MG PO TAB [33228]: 875 mg | ORAL | @ 16:00:00 | Stop: 2020-04-06 | NDC 00093227534

## 2020-04-05 MED ADMIN — KETOCONAZOLE 2 % TP CREA [10368]: TOPICAL | @ 15:00:00 | NDC 00168009915

## 2020-04-05 MED ADMIN — HEPARIN, PORCINE (PF) 5,000 UNIT/0.5 ML IJ SYRG [95535]: 5000 [IU] | SUBCUTANEOUS | @ 03:00:00 | NDC 00409131611

## 2020-04-05 MED ADMIN — METOPROLOL SUCCINATE 25 MG PO TB24 [81866]: 12.5 mg | ORAL | @ 16:00:00 | NDC 00904632261

## 2020-04-05 MED ADMIN — LEVOTHYROXINE 25 MCG PO TAB [4420]: 50 ug | ORAL | @ 12:00:00 | NDC 51079044401

## 2020-04-05 MED ADMIN — HEPARIN, PORCINE (PF) 5,000 UNIT/0.5 ML IJ SYRG [95535]: 5000 [IU] | SUBCUTANEOUS | @ 20:00:00 | NDC 00409131611

## 2020-04-05 MED ADMIN — ASPIRIN 325 MG PO TAB [681]: 325 mg | ORAL | @ 16:00:00 | NDC 66553000101

## 2020-04-05 MED ADMIN — AMIODARONE 200 MG PO TAB [9066]: 200 mg | ORAL | @ 16:00:00 | NDC 00904699361

## 2020-04-05 MED ADMIN — SPIRONOLACTONE 25 MG PO TAB [7437]: 25 mg | ORAL | @ 16:00:00 | NDC 63739054410

## 2020-04-05 MED ADMIN — METRONIDAZOLE 500 MG PO TAB [5016]: 500 mg | ORAL | @ 03:00:00 | Stop: 2020-04-05 | NDC 60687055011

## 2020-04-05 MED ADMIN — BUMETANIDE 1 MG PO TAB [9310]: 3 mg | ORAL | @ 16:00:00 | NDC 50268013111

## 2020-04-05 MED ADMIN — POTASSIUM CHLORIDE 20 MEQ PO TBTQ [35943]: 20 meq | ORAL | @ 16:00:00 | Stop: 2020-04-05 | NDC 00245531989

## 2020-04-05 MED ADMIN — THIAMINE MONONITRATE (VIT B1) 100 MG PO TAB [303868]: 100 mg | ORAL | @ 16:00:00 | NDC 50268085111

## 2020-04-06 MED ADMIN — SPIRONOLACTONE 25 MG PO TAB [7437]: 25 mg | ORAL | @ 15:00:00 | NDC 63739054410

## 2020-04-06 MED ADMIN — HEPARIN, PORCINE (PF) 5,000 UNIT/0.5 ML IJ SYRG [95535]: 5000 [IU] | SUBCUTANEOUS | @ 03:00:00 | NDC 00409131611

## 2020-04-06 MED ADMIN — BUMETANIDE 1 MG PO TAB [9310]: 3 mg | ORAL | NDC 50268013111

## 2020-04-06 MED ADMIN — LINEZOLID 600 MG PO TAB [80084]: 600 mg | ORAL | @ 15:00:00 | Stop: 2020-04-07 | NDC 00904655304

## 2020-04-06 MED ADMIN — METOLAZONE 5 MG PO TAB [10588]: 5 mg | ORAL | @ 23:00:00 | Stop: 2020-04-06 | NDC 51079002401

## 2020-04-06 MED ADMIN — KETOCONAZOLE 2 % TP CREA [10368]: TOPICAL | @ 03:00:00 | NDC 00168009915

## 2020-04-06 MED ADMIN — LEVOTHYROXINE 25 MCG PO TAB [4420]: 50 ug | ORAL | @ 12:00:00 | NDC 51079044401

## 2020-04-06 MED ADMIN — BUMETANIDE 1 MG PO TAB [9310]: 3 mg | ORAL | @ 23:00:00 | NDC 50268013111

## 2020-04-06 MED ADMIN — ASPIRIN 325 MG PO TAB [681]: 325 mg | ORAL | @ 15:00:00 | NDC 66553000101

## 2020-04-06 MED ADMIN — THIAMINE MONONITRATE (VIT B1) 100 MG PO TAB [303868]: 100 mg | ORAL | @ 15:00:00 | NDC 50268085111

## 2020-04-06 MED ADMIN — BUMETANIDE 1 MG PO TAB [9310]: 3 mg | ORAL | @ 15:00:00 | NDC 50268013111

## 2020-04-06 MED ADMIN — HEPARIN, PORCINE (PF) 5,000 UNIT/0.5 ML IJ SYRG [95535]: 5000 [IU] | SUBCUTANEOUS | @ 12:00:00 | NDC 00409131611

## 2020-04-06 MED ADMIN — LINEZOLID 600 MG PO TAB [80084]: 600 mg | ORAL | @ 03:00:00 | Stop: 2020-04-07 | NDC 00904655304

## 2020-04-06 MED ADMIN — AMOXICILLIN-POT CLAVULANATE 875-125 MG PO TAB [33228]: 875 mg | ORAL | Stop: 2020-04-06 | NDC 00093227534

## 2020-04-06 MED ADMIN — AMIODARONE 200 MG PO TAB [9066]: 200 mg | ORAL | @ 15:00:00 | NDC 00904699361

## 2020-04-06 MED ADMIN — ALLOPURINOL 100 MG PO TAB [310]: 100 mg | ORAL | @ 03:00:00 | NDC 51079020501

## 2020-04-06 MED ADMIN — HEPARIN, PORCINE (PF) 5,000 UNIT/0.5 ML IJ SYRG [95535]: 5000 [IU] | SUBCUTANEOUS | @ 20:00:00 | NDC 00409131611

## 2020-04-06 MED ADMIN — METOPROLOL SUCCINATE 25 MG PO TB24 [81866]: 12.5 mg | ORAL | @ 15:00:00 | NDC 00904632261

## 2020-04-07 MED ADMIN — BUMETANIDE 1 MG PO TAB [9310]: 3 mg | ORAL | @ 14:00:00 | Stop: 2020-04-07 | NDC 50268013111

## 2020-04-07 MED ADMIN — LINEZOLID 600 MG PO TAB [80084]: 600 mg | ORAL | @ 02:00:00 | Stop: 2020-04-07 | NDC 00904655304

## 2020-04-07 MED ADMIN — LEVOTHYROXINE 25 MCG PO TAB [4420]: 50 ug | ORAL | @ 12:00:00 | Stop: 2020-04-07 | NDC 51079044401

## 2020-04-07 MED ADMIN — ALLOPURINOL 100 MG PO TAB [310]: 100 mg | ORAL | @ 02:00:00 | NDC 51079020501

## 2020-04-07 MED ADMIN — ASPIRIN 325 MG PO TAB [681]: 325 mg | ORAL | @ 14:00:00 | Stop: 2020-04-07 | NDC 66553000101

## 2020-04-07 MED ADMIN — SPIRONOLACTONE 25 MG PO TAB [7437]: 25 mg | ORAL | @ 14:00:00 | Stop: 2020-04-07 | NDC 63739054410

## 2020-04-07 MED ADMIN — AMIODARONE 200 MG PO TAB [9066]: 200 mg | ORAL | @ 14:00:00 | Stop: 2020-04-07 | NDC 00904699361

## 2020-04-07 MED ADMIN — HEPARIN, PORCINE (PF) 5,000 UNIT/0.5 ML IJ SYRG [95535]: 5000 [IU] | SUBCUTANEOUS | @ 12:00:00 | Stop: 2020-04-07 | NDC 00409131611

## 2020-04-07 MED ADMIN — THIAMINE MONONITRATE (VIT B1) 100 MG PO TAB [303868]: 100 mg | ORAL | @ 14:00:00 | Stop: 2020-04-07 | NDC 50268085111

## 2020-04-07 MED ADMIN — HEPARIN, PORCINE (PF) 5,000 UNIT/0.5 ML IJ SYRG [95535]: 5000 [IU] | SUBCUTANEOUS | @ 04:00:00 | NDC 00409131611

## 2020-04-09 ENCOUNTER — Encounter
Admit: 2020-04-09 | Discharge: 2020-04-09 | Payer: MEDICARE | Primary: Student in an Organized Health Care Education/Training Program

## 2020-04-10 NOTE — Telephone Encounter
Hospital Discharge Follow Up      Reached Patient:No, transferred to Skilled Nursing Facility, [facility]     Admission Information:     Hospital Name: [facility]  Admission Date: 03/29/2020  Discharge Date: 04/07/2020    Admission Diagnosis: Acute on chronic combined systolic and diastolic congestive heart failure  Discharge Diagnosis: Acute on chronic combined systolic and diastolic congestive heart failure,  Ischemic cardiomyopathy, Debility, Mild cognitive impairment, Cellulitis of scrotum, Stage 3b chronic kidney disease  Has there been a discharge within the last 30 days? No  If yes, reason: N/A  Hospital Services: Unplanned  Today's call is 1(business) days post discharge    Meds reviewed and reconciled?No  ? allopurinoL (ZYLOPRIM) 100 mg tablet TAKE 1 TABLET BY MOUTH EVERY NIGHT AT BEDTIME WITH FOOD   ? amiodarone (CORDARONE) 200 mg tablet Take one tablet by mouth daily. Take with food.   ? aspirin 325 mg tablet Take 325 mg by mouth daily. Take with food.   ? bumetanide (BUMEX) 1 mg tablet Take three tablets by mouth twice daily.   ? docusate (COLACE) 100 mg capsule Take 100 mg by mouth twice daily.   ? ergocalciferol (VITAMIN D-2) 1,250 mcg (50,000 unit) capsule TAKE 1 CAPSULE BY MOUTH 1 TIME A WEEK   ? ketoconazole (NIZORAL) 2 % topical cream Apply  topically to affected area twice daily.   ? levothyroxine (SYNTHROID) 50 mcg tablet TAKE 1 TABLET BY MOUTH EVERY DAY 30 MINUTES BEFORE BREAKFAST   ? [START ON 04/10/2020] metOLazone (ZAROXOLYN) 5 mg tablet Take one tablet by mouth twice weekly. To help with excess fluid from CHF. Has to be given 20 minutes prior to bumex dose to work.  Indications: accumulation of fluid resulting from chronic heart failure   ? metoprolol XL (TOPROL XL) 25 mg extended release tablet Take one-half tablet by mouth daily.   ? nystatin (NYSTOP) 100,000 unit/g topical powder Apply  topically to affected area twice daily.   ? polyethylene glycol 3350 (MIRALAX) 17 g packet Take one packet by mouth daily as needed (Constipation - [facility]).   ? senna/docusate (SENOKOT-S) 8.6/50 mg tablet Take one tablet by mouth daily as needed (Constipation - First Line).   ? spironolactone (ALDACTONE) 25 mg tablet Take one tablet by mouth daily. Take with food.   ? thiamine (VITAMIN B-1) 100 mg tablet Take one tablet by mouth daily.        Scheduling Follow-up Appointment   Upcoming appointment date and time and with whom scheduled:   Future Appointments   Date Time Provider Department Center   04/13/2020 11:15 AM Vanice Sarah, MD [facility] C[facility] Exam   06/08/2020  3:00 PM Claudette Head, MBBS [facility] IM   06/15/2020  1:00 PM Claudette Head, MBBS [facility]   07/03/2020 10:00 AM Juliann Pares, DO [facility]     PCP appointment scheduled?Yes, Date: 07/03/2020   PCP primary location: [facility]  Specialist appointment scheduled? Yes, with Cardiology 04/13/2020; [facility] 06/08/2020 and 06/15/2020  Both PCP and Specialist appointment scheduled: Yes    MyChart message sent? Inactive in MyChart.    Yancey Flemings, RN

## 2020-04-14 ENCOUNTER — Encounter
Admit: 2020-04-14 | Discharge: 2020-04-14 | Payer: MEDICARE | Primary: Student in an Organized Health Care Education/Training Program

## 2020-04-28 ENCOUNTER — Encounter
Admit: 2020-04-28 | Discharge: 2020-04-28 | Payer: MEDICARE | Primary: Student in an Organized Health Care Education/Training Program

## 2020-04-28 NOTE — Progress Notes
Amiodarone Monitoring status as of 04/28/20:     Amiodarone monitoring complete.  Next amiodarone review is due in 180 days.    Most recent lab results  Lab Results   Component Value Date/Time    AST 17 04/07/2020 03:29 AM    ALT 11 04/07/2020 03:29 AM    TSH 7.82 (H) 03/29/2020 10:40 PM    FREET4R 1.5 03/29/2020 10:40 PM         Procedures  Last chest X-Ray:03/29/20  Last PFT: 04/01/13  Last eye exam:

## 2020-05-22 ENCOUNTER — Emergency Department: Admit: 2020-05-22 | Discharge: 2020-05-22 | Payer: MEDICARE

## 2020-05-22 ENCOUNTER — Encounter
Admit: 2020-05-22 | Discharge: 2020-05-22 | Payer: MEDICARE | Primary: Student in an Organized Health Care Education/Training Program

## 2020-05-22 ENCOUNTER — Inpatient Hospital Stay: Admit: 2020-05-22 | Payer: MEDICARE

## 2020-05-22 DIAGNOSIS — I10 Essential (primary) hypertension: Secondary | ICD-10-CM

## 2020-05-22 DIAGNOSIS — E872 Acidosis: Secondary | ICD-10-CM

## 2020-05-22 DIAGNOSIS — Z95810 Presence of automatic (implantable) cardiac defibrillator: Secondary | ICD-10-CM

## 2020-05-22 DIAGNOSIS — R55 Syncope and collapse: Secondary | ICD-10-CM

## 2020-05-22 DIAGNOSIS — Z955 Presence of coronary angioplasty implant and graft: Secondary | ICD-10-CM

## 2020-05-22 DIAGNOSIS — N289 Disorder of kidney and ureter, unspecified: Secondary | ICD-10-CM

## 2020-05-22 DIAGNOSIS — T148XXA Other injury of unspecified body region, initial encounter: Secondary | ICD-10-CM

## 2020-05-22 DIAGNOSIS — I2510 Atherosclerotic heart disease of native coronary artery without angina pectoris: Secondary | ICD-10-CM

## 2020-05-22 DIAGNOSIS — Z8679 Personal history of other diseases of the circulatory system: Secondary | ICD-10-CM

## 2020-05-22 DIAGNOSIS — N179 Acute kidney failure, unspecified: Secondary | ICD-10-CM

## 2020-05-22 DIAGNOSIS — E785 Hyperlipidemia, unspecified: Secondary | ICD-10-CM

## 2020-05-22 DIAGNOSIS — K5792 Diverticulitis of intestine, part unspecified, without perforation or abscess without bleeding: Secondary | ICD-10-CM

## 2020-05-22 DIAGNOSIS — M1990 Unspecified osteoarthritis, unspecified site: Secondary | ICD-10-CM

## 2020-05-22 DIAGNOSIS — I255 Ischemic cardiomyopathy: Secondary | ICD-10-CM

## 2020-05-22 LAB — URINALYSIS DIPSTICK REFLEX TO CULTURE
GLUCOSE,UA: NEGATIVE % — ABNORMAL HIGH (ref 41–77)
LEUKOCYTES: NEGATIVE K/UL — ABNORMAL LOW (ref 1.0–4.8)
NITRITE: NEGATIVE K/UL — ABNORMAL HIGH (ref 1.8–7.0)
PROTEIN,UA: NEGATIVE FL (ref 7–11)
URINE BILE: NEGATIVE % (ref 4–12)
URINE BLOOD: NEGATIVE % (ref 0–5)

## 2020-05-22 LAB — COMPREHENSIVE METABOLIC PANEL
ALBUMIN: 3.5 g/dL (ref 3.5–5.0)
ALBUMIN: 3.6 g/dL (ref 3.5–5.0)
ALK PHOSPHATASE: 104 U/L (ref 25–110)
ALK PHOSPHATASE: 112 U/L — ABNORMAL HIGH (ref 25–110)
ALT: 23 U/L (ref 7–56)
ANION GAP: 17 — ABNORMAL HIGH (ref 3–12)
ANION GAP: 20 — ABNORMAL HIGH (ref 3–12)
AST: 35 U/L (ref 7–40)
AST: 32 U/L (ref 7–40)
BLD UREA NITROGEN: 86 mg/dL — ABNORMAL HIGH (ref 7–25)
BLD UREA NITROGEN: 87 mg/dL — ABNORMAL HIGH (ref 7–25)
CALCIUM: 8.5 mg/dL (ref 8.5–10.6)
CALCIUM: 8.8 mg/dL (ref 8.5–10.6)
CHLORIDE: 82 MMOL/L — ABNORMAL LOW (ref 98–110)
CHLORIDE: 80 MMOL/L — ABNORMAL LOW (ref 98–110)
CO2: 30 MMOL/L (ref 21–30)
CREATININE: 3.4 mg/dL — ABNORMAL HIGH (ref 0.4–1.24)
CREATININE: 3.5 mg/dL — ABNORMAL HIGH (ref 0.4–1.24)
EGFR: 17 mL/min — ABNORMAL LOW (ref >60)
EGFR: 17 mL/min — ABNORMAL LOW (ref >60)
GLUCOSE,PANEL: 123 mg/dL — ABNORMAL HIGH (ref 70–100)
GLUCOSE,PANEL: 124 mg/dL — ABNORMAL HIGH (ref 70–100)
POTASSIUM: 3 MMOL/L — ABNORMAL LOW (ref 3.5–5.1)
SODIUM: 129 MMOL/L — ABNORMAL LOW (ref 137–147)
SODIUM: 128 MMOL/L — ABNORMAL LOW (ref 137–147)
TOTAL BILIRUBIN: 1.4 mg/dL — ABNORMAL HIGH (ref 0.3–1.2)
TOTAL BILIRUBIN: 1.5 mg/dL — ABNORMAL HIGH (ref 0.3–1.2)
TOTAL PROTEIN: 7 g/dL (ref 6.0–8.0)
TOTAL PROTEIN: 7.2 g/dL (ref 6.0–8.0)

## 2020-05-22 LAB — POC GLUCOSE: POC GLUCOSE: 168 mg/dL — ABNORMAL HIGH (ref 70–100)

## 2020-05-22 LAB — URINALYSIS MICROSCOPIC REFLEX TO CULTURE

## 2020-05-22 LAB — MAGNESIUM
MAGNESIUM: 2.4 mg/dL — ABNORMAL LOW (ref 1.6–2.6)
MAGNESIUM: 2.4 mg/dL (ref 1.6–2.6)

## 2020-05-22 LAB — CBC AND DIFF
ABSOLUTE BASO COUNT: 0 K/UL (ref 0–0.20)
ABSOLUTE EOS COUNT: 0 K/UL (ref 0–0.45)
ABSOLUTE MONO COUNT: 0.7 K/UL (ref 0–0.80)
HEMATOCRIT: 52 % — ABNORMAL HIGH (ref 40–50)
HEMOGLOBIN: 17 g/dL — ABNORMAL HIGH (ref 13.5–16.5)
LYMPHOCYTES %: 2 % — ABNORMAL LOW (ref 24–44)
MCH: 29 pg (ref 26–34)
MCV: 90 FL (ref 80–100)
MDW (MONOCYTE DISTRIBUTION WIDTH): 24 — ABNORMAL HIGH (ref <20.7)
RBC COUNT: 5.7 M/UL — ABNORMAL HIGH (ref 4.4–5.5)
RDW: 19 % — ABNORMAL HIGH (ref 11–15)
WBC COUNT: 17 K/UL — ABNORMAL HIGH (ref 4.5–11.0)

## 2020-05-22 LAB — LIPASE
LIPASE: 12 U/L (ref 11–82)
LIPASE: 15 U/L (ref 11–82)

## 2020-05-22 LAB — SODIUM-URINE RANDOM: UR SODIUM, RAN: 56 MMOL/L

## 2020-05-22 LAB — POC TROPONIN: TROPONIN I, POC: 0 ng/mL — ABNORMAL HIGH (ref 0.00–0.05)

## 2020-05-22 LAB — POC LACTATE: LACTIC ACID POC: 4.2 MMOL/L — ABNORMAL HIGH (ref 0.5–2.0)

## 2020-05-22 LAB — BNP POC ER: BNP POC: 104 pg/mL — ABNORMAL HIGH (ref 0–100)

## 2020-05-22 LAB — CREATININE-URINE RANDOM: UR CREATININE, RAN: 62 mg/dL

## 2020-05-22 LAB — UREA NITROGEN-URINE RANDOM: UR UREA NIT, RAN: 360 mg/dL

## 2020-05-22 LAB — OSMOLALITY-URINE RANDOM: UR. OSMOLALITY: 319 mosm/kg (ref 50–1400)

## 2020-05-22 MED ORDER — CEFTRIAXONE INJ 1GM IVP
1 g | Freq: Once | INTRAVENOUS | 0 refills | Status: CP
Start: 2020-05-22 — End: ?
  Administered 2020-05-22: 19:00:00 1 g via INTRAVENOUS

## 2020-05-22 MED ORDER — ERGOCALCIFEROL (VITAMIN D2) 1,250 MCG (50,000 UNIT) PO CAP
50000 [IU] | ORAL | 0 refills | Status: AC
Start: 2020-05-22 — End: ?
  Administered 2020-05-29: 13:00:00 50000 [IU] via ORAL

## 2020-05-22 MED ORDER — CEFTRIAXONE INJ 1GM IVP
1 g | INTRAVENOUS | 0 refills | Status: AC
Start: 2020-05-22 — End: ?
  Administered 2020-05-23: 19:00:00 1 g via INTRAVENOUS

## 2020-05-22 MED ORDER — AMIODARONE 200 MG PO TAB
200 mg | Freq: Every day | ORAL | 0 refills | Status: AC
Start: 2020-05-22 — End: ?
  Administered 2020-05-23 – 2020-05-31 (×9): 200 mg via ORAL

## 2020-05-22 MED ORDER — METRONIDAZOLE IN NACL (ISO-OS) 500 MG/100 ML IV PGBK
500 mg | Freq: Once | INTRAVENOUS | 0 refills | Status: CP
Start: 2020-05-22 — End: ?
  Administered 2020-05-22: 19:00:00 500 mg via INTRAVENOUS

## 2020-05-22 MED ORDER — ONDANSETRON HCL (PF) 4 MG/2 ML IJ SOLN
4 mg | INTRAVENOUS | 0 refills | Status: AC | PRN
Start: 2020-05-22 — End: ?
  Administered 2020-05-23 (×2): 4 mg via INTRAVENOUS

## 2020-05-22 MED ORDER — LEVOTHYROXINE 50 MCG PO TAB
50 ug | Freq: Every day | ORAL | 0 refills | Status: AC
Start: 2020-05-22 — End: ?
  Administered 2020-05-23 – 2020-05-31 (×9): 50 ug via ORAL

## 2020-05-22 MED ORDER — ACETAMINOPHEN 500 MG PO TAB
500 mg | ORAL | 0 refills | Status: AC | PRN
Start: 2020-05-22 — End: ?
  Administered 2020-05-23 – 2020-05-24 (×3): 500 mg via ORAL

## 2020-05-22 MED ORDER — SODIUM CHLORIDE 0.9 % IV SOLP
INTRAVENOUS | 0 refills | Status: DC
Start: 2020-05-22 — End: 2020-05-22

## 2020-05-22 MED ORDER — METRONIDAZOLE IN NACL (ISO-OS) 500 MG/100 ML IV PGBK
500 mg | INTRAVENOUS | 0 refills | Status: AC
Start: 2020-05-22 — End: ?
  Administered 2020-05-23 – 2020-05-24 (×5): 500 mg via INTRAVENOUS

## 2020-05-22 MED ORDER — LACTATED RINGERS IV SOLP
500 mL | INTRAVENOUS | 0 refills | Status: CP
Start: 2020-05-22 — End: ?
  Administered 2020-05-22: 19:00:00 500 mL via INTRAVENOUS

## 2020-05-22 MED ORDER — BUMETANIDE 0.25MG/ML IV DRIP (STD CONC)
.25 mg/h | INTRAVENOUS | 0 refills | Status: AC
Start: 2020-05-22 — End: ?
  Administered 2020-05-22: 0.25 mg/h via INTRAVENOUS

## 2020-05-22 MED ORDER — ASPIRIN 325 MG PO TAB
325 mg | Freq: Every day | ORAL | 0 refills | Status: AC
Start: 2020-05-22 — End: ?
  Administered 2020-05-23 – 2020-05-24 (×2): 325 mg via ORAL

## 2020-05-22 MED ORDER — POLYETHYLENE GLYCOL 3350 17 GRAM PO PWPK
1 | Freq: Every day | ORAL | 0 refills | Status: AC | PRN
Start: 2020-05-22 — End: ?
  Administered 2020-05-23: 14:00:00 17 g via ORAL

## 2020-05-22 MED ORDER — THIAMINE MONONITRATE (VIT B1) 100 MG PO TAB
100 mg | Freq: Every day | ORAL | 0 refills | Status: AC
Start: 2020-05-22 — End: ?
  Administered 2020-05-23 – 2020-05-31 (×10): 100 mg via ORAL

## 2020-05-22 MED ORDER — SENNOSIDES-DOCUSATE SODIUM 8.6-50 MG PO TAB
1 | Freq: Every day | ORAL | 0 refills | Status: AC | PRN
Start: 2020-05-22 — End: ?
  Administered 2020-05-23 – 2020-05-25 (×4): 1 via ORAL

## 2020-05-22 MED ORDER — MELATONIN 5 MG PO TAB
5 mg | Freq: Every evening | ORAL | 0 refills | Status: AC | PRN
Start: 2020-05-22 — End: ?
  Administered 2020-05-24: 03:00:00 5 mg via ORAL

## 2020-05-22 MED ORDER — ONDANSETRON 4 MG PO TBDI
4 mg | ORAL | 0 refills | Status: AC | PRN
Start: 2020-05-22 — End: ?

## 2020-05-22 MED ORDER — HEPARIN, PORCINE (PF) 5,000 UNIT/0.5 ML IJ SYRG
5000 [IU] | SUBCUTANEOUS | 0 refills | Status: AC
Start: 2020-05-22 — End: ?
  Administered 2020-05-23 – 2020-05-24 (×4): 5000 [IU] via SUBCUTANEOUS

## 2020-05-22 NOTE — H&P (View-Only)
Name:  [facility]                                             MRN:  [id number]   Admission Date:  05/22/2020                     Assessment/Plan:      85 yo M with PMH of gout, hypothyroidism, CKD stage?3, hx of ? 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    Vomiting (non bloody)  Diverticulitis  - CT A/P: suggestive of mild, uncomplicated sigmoid diverticulitis.  - started on ceftriaxone and a metronidazole - will continue  - NPO for now, may advance diet based on clinical course  ?  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 Mod dilated RV with mod dysfunction. Mild to mod MR with posteriorly directed jet, mild to mod TR, dilated IVC  -HF consulted   -start bumex gtt, hold PTA metolazone  -Continue PTA aspirin, metoprolol XL, amiodarone  -Low sodium diet, fluid restrict 2L, strict I/O's  -daily standing weights   ?  AKI on CKD stage 3  -baseline Cr around 1.8  -scr 3.5 on admission, ordered urine studies  -avoid nephrotoxic agents  ?  Mild cognitive impairment  -Patient is alert and oriented and able to communicate well.   -SLP cog eval: MoCA 19/30;?mild to moderate cognitive impairment   -PT/OT consult??  ?  Gout?  -hold PTA allopurinol (aki)  ?  Hypothyroidism  ?-continue PTA synthroid   ?  FEN:  - no IVF   - NPO except meds  - DVT ppx with heparin    Plan of care was discussed in detail with the patient and ED team  Medication side effects explained  Imaging findings explained, rec-ed outpatient follow up with her pcp to address those findings  He verbalized understanding and an agreement with the above plan  ______________________________________________________________________________    Primary Care Physician: Juliann Pares     Chief Complaint:     85 yo M with PMH as bellow presenting to emergency department with nausea and non bloody vomiting for one week. He states he has been vomiting after eating but denies abdominal pain, diarrhea/constipation, chest pain, SOB, cough, leg swelling, back pain, LOC, weakness/numnbess, any bleeding. Patient states he was given anti-nausea medications at his facility, but did not have improvement. He was admitted on 03/29/2020 to 04/07/2020 for CHF and scrotal cellulitis as well as staph epi bacteremia which he was treated for with IV antibiotics. ROS reviewed and negative.  ?  History of Present Illness: [facility] is a 85 y.o. male with PMH of     Medical History:   Diagnosis Date   ? Arthritis    ? CAD (coronary artery disease)    ? Dyslipidemia    ? Fracture     RT ANKLE   ? History of ventricular tachycardia    ? HTN (hypertension)    ? ICD (implantable cardiac defibrillator) in place    ? Ischemic cardiomyopathy    ? Neurologic cardiac syncope    ?  Renal insufficiency     mild   ? S/P coronary artery stent placement      Surgical History:   Procedure Laterality Date   ? EXTERNAL FIXATION APPLICATION ANKLE Right 11/13/2015    Performed by Azzie Glatter, MD at [facility] [place]   ? EXTERNAL FIXATION REMOVAL RIGHT ANKLE Right 12/08/2015    Performed by Azzie Glatter, MD at [facility] OR   ? OPEN REDUCTION INTERNAL FIXATION RIGHT ANKLE Right 12/08/2015    Performed by Azzie Glatter, MD at [facility]   ? INSERTION LEAD LEFT VENTRICLE AT TIME OF INSERTION OF PACEMAKER/ IMPLANTABLE DEFIBRILLATOR Left 09/09/2018    Performed by Annamarie Dawley, MD at [facility] EP LAB   ? REMOVAL AND REPLACEMENT PERMANENT PACEMAKER GENERATOR  - MULTIPLE LEAD SYSTEM Left 09/09/2018    Performed by Annamarie Dawley, MD at [facility] EP LAB   ? CARDIAC DEFIBRILLATOR PLACEMENT     ? HX HEART CATHETERIZATION     ? HX ICD PLACEMENT     ? HX PACEMAKER PLACEMENT       Family History   Problem Relation Age of Onset   ? Cancer Mother    ? Gout Mother    ? Cancer Sister    ? Diabetes Sister    ? Heart Attack Sister    ? Hypertension Sister    ? Gout Sister    ? Arthritis-osteo Sister    ? Stroke Sister    ? Thyroid Disease Sister    ? Migraines Sister      Social History     Socioeconomic History   ? Marital status: Widowed     Spouse name: Not on file   ? Number of children: 2   ? Years of education: Not on file   ? Highest education level: Not on file   Occupational History   ? Not on file   Tobacco Use   ? Smoking status: Never Smoker   ? Smokeless tobacco: Never Used   Vaping Use   ? Vaping Use: Never used   Substance and Sexual Activity   ? Alcohol use: Never   ? Drug use: Never   ? Sexual activity: Not on file   Other Topics Concern   ? Not on file   Social History Narrative   ? Not on file      Immunizations (includes history and patient reported):   Immunization History   Administered Date(s) Administered   ? COVID-19 (PFIZER), mRNA vacc, 30 mcg/0.3 mL (PF) 08/12/2019, 09/02/2019   ? Tdap Vaccine 11/11/2015           Allergies:  Chlorhexidine gluconate and Colchicine    Medications:  (Not in a hospital admission)    Review of Systems:  A comprehensive  12 point review of organ systems reviewed and was negative except for the ones mentioned in HOPI    Physical Exam:  Vital Signs: Last Filed In 24 Hours Vital Signs: 24 Hour Range   BP: 119/67 (04/25 1339)  Temp: 36.4 ?C (97.6 ?F) (04/25 1122)  Pulse: 61 (04/25 1339)  Respirations: 14 PER MINUTE (04/25 1112)  SpO2: 98 % (04/25 1339)  SpO2 Pulse: 61 (04/25 1339) BP: (102-119)/(63-67)   Temp:  [36.4 ?C (97.6 ?F)]   Pulse:  [61-66]   Respirations:  [14 PER MINUTE]   SpO2:  [96 %-98 %]           General:  Alert, awake, oriented x 2-3 , cooperative, no distress  Head:  Normocephalic, without obvious abnormality, atraumatic  Eyes:  Conjunctivae/corneas clear   Nose: Nares normal. Mucosa normal.  No drainage or sinus tenderness  Throat: Lips, mucosa and tongue normal  Neck:    Supple, symmetrical, trachea midline  Lungs:  Clear to auscultation bilaterally  Heart:   Regular rate and rhythm, S1, S2 normal, + S murmur  Abdomen:  Soft, non-tender.  Bowel sounds normal.   Extremities: Extremities normal, atraumatic, no cyanosis or edema  Skin: Skin color, texture, turgor normal.    Neurologic: Non focal grossly    Lab/Radiology/Other Diagnostic Tests:  24-hour labs:    Results for orders placed or performed during the hospital encounter of 05/22/20 (from the past 24 hour(s))   POC GLUCOSE    Collection Time: 05/22/20 11:57 AM   Result Value Ref Range    Glucose, POC 168 (H) 70 - 100 MG/DL   CBC AND DIFF    Collection Time: 05/22/20 11:58 AM   Result Value Ref Range    White Blood Cells 17.8 (H) 4.5 - 11.0 K/UL    RBC 5.72 (H) 4.4 - 5.5 M/UL    Hemoglobin 17.1 (H) 13.5 - 16.5 GM/DL    Hematocrit 45.4 (H) 40 - 50 %    MCV 90.9 80 - 100 FL    MCH 29.9 26 - 34 PG    MCHC 32.9 32.0 - 36.0 G/DL    RDW 09.8 (H) 11 - 15 %    Platelet Count 185 150 - 400 K/UL    MPV 9.1 7 - 11 FL    Neutrophils 94 (H) 41 - 77 %    Lymphocytes 2 (L) 24 - 44 %    Monocytes 4 4 - 12 %    Eosinophils 0 0 - 5 %    Basophils 0 0 - 2 %    Absolute Neutrophil Count 16.61 (H) 1.8 - 7.0 K/UL    Absolute Lymph Count 0.37 (L) 1.0 - 4.8 K/UL    Absolute Monocyte Count 0.77 0 - 0.80 K/UL    Absolute Eosinophil Count 0.01 0 - 0.45 K/UL    Absolute Basophil Count 0.02 0 - 0.20 K/UL    MDW (Monocyte Distribution Width) 24.2 (H) <20.7   POC TROPONIN    Collection Time: 05/22/20 12:02 PM   Result Value Ref Range    Troponin-I-POC 0.06 (H) 0.00 - 0.05 NG/ML   BNP POC ER    Collection Time: 05/22/20 12:02 PM   Result Value Ref Range    BNP POC 1,045.0 (H) 0 - 100 PG/ML   POC LACTATE    Collection Time: 05/22/20 12:04 PM   Result Value Ref Range    LACTIC ACID POC 4.2 (HH) 0.5 - 2.0 MMOL/L   LIPASE    Collection Time: 05/22/20  1:23 PM   Result Value Ref Range    Lipase 12 11 - 82 U/L   COMPREHENSIVE METABOLIC PANEL    Collection Time: 05/22/20  1:23 PM   Result Value Ref Range    Sodium 129 (L) 137 - 147 MMOL/L    Potassium 3.2 (L) 3.5 - 5.1 MMOL/L    Chloride 82 (L) 98 - 110 MMOL/L    Glucose 123 (H) 70 - 100 MG/DL    Blood Urea Nitrogen 86 (H) 7 - 25 MG/DL    Creatinine 1.19 (H) 0.4 - 1.24 MG/DL    Calcium 8.5 8.5 - 14.7 MG/DL    Total Protein 7.0 6.0 - 8.0 G/DL  Total Bilirubin 1.4 (H) 0.3 - 1.2 MG/DL    Albumin 3.5 3.5 - 5.0 G/DL    Alk Phosphatase 161 25 - 110 U/L    AST (SGOT) 35 7 - 40 U/L    CO2 30 21 - 30 MMOL/L    ALT (SGPT) 23 7 - 56 U/L    Anion Gap 17 (H) 3 - 12    eGFR 17 (L) >60 mL/min   MAGNESIUM    Collection Time: 05/22/20  1:23 PM   Result Value Ref Range    Magnesium 2.4 1.6 - 2.6 mg/dL   URINALYSIS DIPSTICK REFLEX TO CULTURE    Collection Time: 05/22/20  1:39 PM    Specimen: Urine   Result Value Ref Range    Color,UA YELLOW     Turbidity,UA CLEAR CLEAR-CLEAR    Specific Gravity-Urine 1.011 1.005 - 1.030    pH,UA 6.0 5.0 - 8.0    Protein,UA NEG NEG-NEG    Glucose,UA NEG NEG-NEG    Ketones,UA NEG NEG-NEG    Bilirubin,UA NEG NEG-NEG    Blood,UA NEG NEG-NEG    Urobilinogen,UA NORMAL NORM-NORMAL    Nitrite,UA NEG NEG-NEG    Leukocytes,UA NEG NEG-NEG    Urine Ascorbic Acid, UA NEG NEG-NEG   URINALYSIS MICROSCOPIC REFLEX TO CULTURE    Collection Time: 05/22/20  1:39 PM    Specimen: Urine   Result Value Ref Range    WBCs,UA 0-2 0 - 2 /HPF    RBCs,UA 0-2 0 - 3 /HPF    Comment,UA       Criteria for reflex to culture are WBC>10, Positive Nitrite, and/or >=+1   leukocytes. If quantity is not sufficient, an addendum will follow.      MucousUA TRACE     Hyaline Cast PACKED      Glucose: (!) 123 (05/22/20 1323)  POC Glucose (Download): (!) 168 (05/22/20 1157)  Pertinent radiology reviewed.    CHEST SINGLE VIEW    Result Date: 05/22/2020  Cardiac silhouette enlargement. No consolidation.  Finalized by Delmar Landau, M.D. on 05/22/2020 1:27 PM. Dictated by Delmar Landau, M.D. on 05/22/2020 1:25 PM.     CT ABD/PELV WO CONTRAST    Result Date: 05/22/2020  Findings suggestive of mild, uncomplicated sigmoid diverticulitis. These findings were reported to Dr. Ronney Asters by Dr. Hyacinth Meeker by telephone at 12:55 PM on May 22, 2020.  Finalized by Prince Rome, D.O. on 05/22/2020 12:57 PM. Dictated by Prince Rome, D.O. on 05/22/2020 12:45 PM.

## 2020-05-22 NOTE — ED Notes
[facility] is a 85 y/o male who presents to the ED with CC of vomiting. Per friend, pt has been vomiting for 2-3 weeks. His care facility took xrays of his abdomen and prescribed him Zofran, but has had no improvement. He is tender in Left Mid Quadrant. Reports he only vomits after he "eats that nasty food." Denies blood in emesis, fevers, soa, chest pain, diarrhea, or constipation.  He is alert and oriented.    Pt placed on ED monitor. Airway is patent. Lungs CTA. Heart NSR. Pt is PWD. NAD noted.   Bed in low lock position. Call light in reach.

## 2020-05-22 NOTE — ED Provider Notes
Shanti Lyrick Pageau is a 85 y.o. male.    Chief Complaint:  Chief Complaint   Patient presents with   ? Vomiting       History of Present Illness:  Patient is a 85 y.o. 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 Patient reports nausea and vomiting the past 2-3 weeks. He states he has vomiting after he completes a meal. He denies abdominal pain, diarrhea, chest pain, SOB, leg swelling, back pain, and weakness/numnbess. Patient states he was given anti-nausea medications at his facility, but did not have improvement. His DPOA had him brought here today because she wanted him to be evaluated, but he denies any acute changes. Of note, patient had recent admission for heart failure exacerbation from 03/29/2020 to 04/07/2020 during that hospitalization he was also noted to have scrotal cellulitis as well as staph epi bacteremia which he was treated for with IV antibiotics and repeat cultures were negative.       History provided by:  Patient  Language interpreter used: No        Review of Systems:  Review of Systems   Constitutional: Negative for fever.   HENT: Negative for sore throat.    Eyes: Negative for visual disturbance.   Respiratory: Negative for shortness of breath.    Cardiovascular: Negative for chest pain.   Gastrointestinal: Positive for nausea and vomiting. Negative for abdominal pain, constipation and diarrhea.   Genitourinary: Negative for dysuria.   Musculoskeletal: Negative for back pain.   Skin: Negative for rash.   Neurological: Negative for headaches.       Allergies:  Chlorhexidine gluconate and Colchicine    Past Medical History:  Medical History:   Diagnosis Date   ? Arthritis    ? CAD (coronary artery disease)    ? Dyslipidemia    ? Fracture     RT ANKLE   ? History of ventricular tachycardia    ? HTN (hypertension) ? ICD (implantable cardiac defibrillator) in place    ? Ischemic cardiomyopathy    ? Neurologic cardiac syncope    ? Renal insufficiency     mild   ? S/P coronary artery stent placement        Past Surgical History:  Surgical History:   Procedure Laterality Date   ? EXTERNAL FIXATION APPLICATION ANKLE Right 11/13/2015    Performed by Azzie Glatter, MD at [facility] OR   ? EXTERNAL FIXATION REMOVAL RIGHT ANKLE Right 12/08/2015    Performed by Azzie Glatter, MD at [facility] OR   ? OPEN REDUCTION INTERNAL FIXATION RIGHT ANKLE Right 12/08/2015    Performed by Azzie Glatter, MD at [facility] OR   ? INSERTION LEAD LEFT VENTRICLE AT TIME OF INSERTION OF PACEMAKER/ IMPLANTABLE DEFIBRILLATOR Left 09/09/2018    Performed by Annamarie Dawley, MD at [facility] EP LAB   ? REMOVAL AND REPLACEMENT PERMANENT PACEMAKER GENERATOR  - MULTIPLE LEAD SYSTEM Left 09/09/2018    Performed by Annamarie Dawley, MD at [facility] EP LAB   ? CARDIAC DEFIBRILLATOR PLACEMENT     ? HX HEART CATHETERIZATION     ? HX ICD PLACEMENT     ? HX PACEMAKER PLACEMENT         Pertinent medical and surgical history reviewed    Social History:  Social History     Tobacco Use   ?  Smoking status: Never Smoker   ? Smokeless tobacco: Never Used   Vaping Use   ? Vaping Use: Never used   Substance Use Topics   ? Alcohol use: Never   ? Drug use: Never     Social History     Substance and Sexual Activity   Drug Use Never             Family History:  Family History   Problem Relation Age of Onset   ? Cancer Mother    ? Gout Mother    ? Cancer Sister    ? Diabetes Sister    ? Heart Attack Sister    ? Hypertension Sister    ? Gout Sister    ? Arthritis-osteo Sister    ? Stroke Sister    ? Thyroid Disease Sister    ? Migraines Sister        Vitals:  ED Vitals    Date and Time T BP P RR SPO2P SPO2 User   05/22/20 1950 -- -- 60 19 PER MINUTE 60 96 % OK   05/22/20 1901 -- -- -- 23 PER MINUTE -- -- TD   05/22/20 1900 -- 101/60 60 -- 60 95 % TD   05/22/20 1730 -- 99/61 60 22 PER MINUTE 60 95 % BF   05/22/20 1339 -- 119/67 61 -- 61 98 % BF   05/22/20 1122 36.4 ?C (97.6 ?F) -- -- -- -- -- BF   05/22/20 1112 -- 102/63 66 14 PER MINUTE 66 96 % BF          Physical Exam:  Physical Exam  Vitals and nursing note reviewed.   Constitutional:       General: He is not in acute distress.     Appearance: He is normal weight.   HENT:      Head: Normocephalic and atraumatic.      Mouth/Throat:      Mouth: Mucous membranes are dry.   Eyes:      Extraocular Movements: Extraocular movements intact.      Conjunctiva/sclera: Conjunctivae normal.   Cardiovascular:      Rate and Rhythm: Normal rate and regular rhythm.      Pulses: Normal pulses.      Heart sounds: Normal heart sounds.   Pulmonary:      Effort: Pulmonary effort is normal.      Breath sounds: Normal breath sounds. No wheezing, rhonchi or rales.   Abdominal:      General: There is no distension.      Palpations: Abdomen is soft.      Tenderness: There is abdominal tenderness in the left upper quadrant.   Genitourinary:     Penis: Normal.       Testes: Normal.         Right: Tenderness or swelling not present.         Left: Tenderness or swelling not present.      Comments: No perineal tenderness or rash  Musculoskeletal:         General: Normal range of motion.      Cervical back: Neck supple.      Right lower leg: No edema.      Left lower leg: No edema.   Skin:     General: Skin is warm and dry.      Coloration: Skin is pale.   Neurological:      Mental Status: He is oriented to person, place, and time. Mental  status is at baseline.   Psychiatric:         Mood and Affect: Mood normal.         Behavior: Behavior normal.         Laboratory Results:  Labs Reviewed   CBC AND DIFF - Abnormal       Result Value Ref Range Status    White Blood Cells 17.8 (*) 4.5 - 11.0 K/UL Final    RBC 5.72 (*) 4.4 - 5.5 M/UL Final    Hemoglobin 17.1 (*) 13.5 - 16.5 GM/DL Final    Hematocrit 10.9 (*) 40 - 50 % Final    MCV 90.9  80 - 100 FL Final    MCH 29.9  26 - 34 PG Final    MCHC 32.9  32.0 - 36.0 G/DL Final    RDW 60.4 (*) 11 - 15 % Final    Platelet Count 185  150 - 400 K/UL Final    MPV 9.1  7 - 11 FL Final    Neutrophils 94 (*) 41 - 77 % Final    Lymphocytes 2 (*) 24 - 44 % Final    Monocytes 4  4 - 12 % Final    Eosinophils 0  0 - 5 % Final    Basophils 0  0 - 2 % Final    Absolute Neutrophil Count 16.61 (*) 1.8 - 7.0 K/UL Final    Absolute Lymph Count 0.37 (*) 1.0 - 4.8 K/UL Final    Absolute Monocyte Count 0.77  0 - 0.80 K/UL Final    Absolute Eosinophil Count 0.01  0 - 0.45 K/UL Final    Absolute Basophil Count 0.02  0 - 0.20 K/UL Final    MDW (Monocyte Distribution Width) 24.2 (*) <20.7 Final   POC GLUCOSE - Abnormal    Glucose, POC 168 (*) 70 - 100 MG/DL Final   POC LACTATE - Abnormal    LACTIC ACID POC 4.2 (*) 0.5 - 2.0 MMOL/L Final   POC TROPONIN - Abnormal    Troponin-I-POC 0.06 (*) 0.00 - 0.05 NG/ML Final   BNP POC ER - Abnormal    BNP POC 1,045.0 (*) 0 - 100 PG/ML Final   COMPREHENSIVE METABOLIC PANEL - Abnormal    Sodium 129 (*) 137 - 147 MMOL/L Final    Potassium 3.2 (*) 3.5 - 5.1 MMOL/L Final    Chloride 82 (*) 98 - 110 MMOL/L Final    Glucose 123 (*) 70 - 100 MG/DL Final    Blood Urea Nitrogen 86 (*) 7 - 25 MG/DL Final    Creatinine 5.40 (*) 0.4 - 1.24 MG/DL Final    Calcium 8.5  8.5 - 10.6 MG/DL Final    Total Protein 7.0  6.0 - 8.0 G/DL Final    Total Bilirubin 1.4 (*) 0.3 - 1.2 MG/DL Final    Albumin 3.5  3.5 - 5.0 G/DL Final    Alk Phosphatase 104  25 - 110 U/L Final    AST (SGOT) 35  7 - 40 U/L Final    CO2 30  21 - 30 MMOL/L Final    ALT (SGPT) 23  7 - 56 U/L Final    Anion Gap 17 (*) 3 - 12 Final    eGFR 17 (*) >60 mL/min Final   COMPREHENSIVE METABOLIC PANEL - Abnormal    Sodium 128 (*) 137 - 147 MMOL/L Final    Potassium 3.0 (*) 3.5 - 5.1 MMOL/L Final    Chloride  80 (*) 98 - 110 MMOL/L Final    Glucose 124 (*) 70 - 100 MG/DL Final    Blood Urea Nitrogen 87 (*) 7 - 25 MG/DL Final    Creatinine 4.09 (*) 0.4 - 1.24 MG/DL Final    Calcium 8.8  8.5 - 10.6 MG/DL Final    Total Protein 7.2  6.0 - 8.0 G/DL Final    Total Bilirubin 1.5 (*) 0.3 - 1.2 MG/DL Final    Albumin 3.6  3.5 - 5.0 G/DL Final    Alk Phosphatase 112 (*) 25 - 110 U/L Final    AST (SGOT) 32  7 - 40 U/L Final    CO2 28  21 - 30 MMOL/L Final    ALT (SGPT) 26  7 - 56 U/L Final    Anion Gap 20 (*) 3 - 12 Final    eGFR 17 (*) >60 mL/min Final   CULTURE-BLOOD W/SENSITIVITY   CULTURE-BLOOD W/SENSITIVITY   URINALYSIS DIPSTICK REFLEX TO CULTURE    Color,UA YELLOW   Final    Turbidity,UA CLEAR  CLEAR-CLEAR Final    Specific Gravity-Urine 1.011  1.005 - 1.030 Final    pH,UA 6.0  5.0 - 8.0 Final    Protein,UA NEG  NEG-NEG Final    Glucose,UA NEG  NEG-NEG Final    Ketones,UA NEG  NEG-NEG Final    Bilirubin,UA NEG  NEG-NEG Final    Blood,UA NEG  NEG-NEG Final    Urobilinogen,UA NORMAL  NORM-NORMAL Final    Nitrite,UA NEG  NEG-NEG Final    Leukocytes,UA NEG  NEG-NEG Final    Urine Ascorbic Acid, UA NEG  NEG-NEG Final   URINALYSIS MICROSCOPIC REFLEX TO CULTURE    WBCs,UA 0-2  0 - 2 /HPF Final    RBCs,UA 0-2  0 - 3 /HPF Final    Comment,UA     Final    Value: Criteria for reflex to culture are WBC>10, Positive Nitrite, and/or >=+1   leukocytes. If quantity is not sufficient, an addendum will follow.      MucousUA TRACE   Final    Hyaline Cast PACKED   Final   UA REFLEX LABEL    UA Reflex Culture     Final    Value: Criteria for reflex to culture are WBC>10, Positive Nitrite, and/or >=+1   leukocytes. If quantity is not sufficient, an addendum will follow.     LIPASE    Lipase 12  11 - 82 U/L Final   MAGNESIUM    Magnesium 2.4  1.6 - 2.6 mg/dL Final   LIPASE    Lipase 15  11 - 82 U/L Final   MAGNESIUM    Magnesium 2.4  1.6 - 2.6 mg/dL Final   CREATININE-URINE RANDOM   SODIUM-URINE RANDOM   OSMOLALITY-URINE RANDOM   UREA NITROGEN-URINE RANDOM   OSMOLALITY   BASIC METABOLIC PANEL   LACTIC ACID(LACTATE)   POC TROPONIN   POC BNP   POC GLUCOSE   POC LACTATE   POC LACTATE     POC Glucose (Download): (!) 168    Radiology Interpretation:    CT ABD/PELV WO CONTRAST   Final Result         Findings suggestive of mild, uncomplicated sigmoid diverticulitis.      These findings were reported to Dr. Ronney Asters by Dr. Hyacinth Meeker by telephone at 12:55 PM on May 22, 2020.          Finalized by Prince Rome, D.O. on 05/22/2020 12:57 PM. Dictated by  Prince Rome, D.O. on 05/22/2020 12:45 PM.         CHEST SINGLE VIEW   Final Result      Cardiac silhouette enlargement. No consolidation.          Finalized by Delmar Landau, M.D. on 05/22/2020 1:27 PM. Dictated by Delmar Landau, M.D. on 05/22/2020 1:25 PM.               EKG:    Heart rate 62, ventricularly paced complexes resulting in limited interpretation, PR interval 170 QTc 528, no STEMI      ED Course:  -Patient was roomed, IV access was established, and patient was placed on both the cardiac and pulse oximetry monitors.  -Pertinent available records reviewed.    -Patient was seen and evaluated by resident and attending physicians.  -History and physical exam performed as noted above.  -Vital signs were reviewed and noted above.    85 year old male, very comorbid including HFrEF 15% presents for nausea and vomiting x2 weeks.  Patient hemodynamically stable and not complaining of any significant symptoms at this time however when abdomen palpated he does express some tenderness.  Lab work obtained showing significantly elevated leukocytosis, slightly elevated troponin of 0.06 suspect demand ischemia, BNP is elevated at 1000 however it is actually lower than previous presentations.  Lactic acidosis noted.  Hyponatremia hypokalemia, severe AKI.  UA negative for infection.  CT positive for sigmoid diverticulitis uncomplicated.  Patient given small fluid bolus despite his heart failure as he still does appear to be somewhat fluid down and this does appear to be correlating with his lab work as well.  Chest x-ray shows no signs of pulmonary edema.  Patient started on ceftriaxone and metronidazole for diverticulitis.  Patient admitted to internal medicine for diverticulitis, AKI, metabolic derangement.  Discussed results, answered questions, reviewed plan for admission.         ED Scoring:                                MDM  Reviewed: previous chart, nursing note and vitals  Interpretation: labs        Facility Administered Meds:  Medications   cefTRIAXone (ROCEPHIN) IVP 1 g (has no administration in time range)   metroNIDAZOLE (FLAGYL) 500 mg IVPB 100 mL (has no administration in time range)   aspirin tablet 325 mg (has no administration in time range)   amiodarone (CORDARONE) tablet 200 mg (has no administration in time range)   ergocalciferol (vitamin D2) (DRISDOL) capsule 50,000 Units (has no administration in time range)   levothyroxine (SYNTHROID) tablet 50 mcg (has no administration in time range)   thiamine (VITAMIN B-1) tablet 100 mg (has no administration in time range)   bumetanide (BUMEX) 12.5 mg in empty IV bag 50 mL IV drip (std conc) (0.25 mg/hr Intravenous Continue to Inpatient 05/22/20 1958)   cefTRIAXone (ROCEPHIN) IVP 1 g (1 g Intravenous Given 05/22/20 1357)   metroNIDAZOLE (FLAGYL) 500 mg IVPB 100 mL (0 mg Intravenous Infusion Stopped 05/22/20 1524)   lactated ringers infusion (0 mL Intravenous Infusion Stopped 05/22/20 1524)         Clinical Impression:  Clinical Impression   Diverticulitis   Lactic acidosis   AKI (acute kidney injury) (HCC)       Disposition/Follow up  ED Disposition     ED Disposition    Admit        No follow-up provider  specified.    Medications:  New Prescriptions    No medications on file       Procedure Notes:  Procedures      Attestation / Supervision:      I, Oliver Barre, am scribing for and in the presence of Daylene Posey, MD.    Oliver Barre      I, Daylene Posey, MD, personally performed the services described in this documentation as scribed and it is both accurate and complete.

## 2020-05-23 ENCOUNTER — Inpatient Hospital Stay
Admit: 2020-05-23 | Discharge: 2020-05-23 | Payer: MEDICARE | Primary: Student in an Organized Health Care Education/Training Program

## 2020-05-23 MED ADMIN — POTASSIUM CHLORIDE 20 MEQ PO TBTQ [35943]: 60 meq | ORAL | @ 14:00:00 | Stop: 2020-05-23 | NDC 00904708661

## 2020-05-23 MED ADMIN — POTASSIUM CHLORIDE IN WATER 10 MEQ/50 ML IV PGBK [11075]: 10 meq | INTRAVENOUS | @ 14:00:00 | Stop: 2020-05-23 | NDC 00338070541

## 2020-05-23 MED ADMIN — POTASSIUM CHLORIDE 20 MEQ PO TBTQ [35943]: 60 meq | ORAL | @ 14:00:00 | Stop: 2020-05-23 | NDC 00245531989

## 2020-05-23 MED ADMIN — POTASSIUM CHLORIDE IN WATER 10 MEQ/50 ML IV PGBK [11075]: 10 meq | INTRAVENOUS | @ 19:00:00 | Stop: 2020-05-23 | NDC 00338070541

## 2020-05-23 MED ADMIN — POTASSIUM CHLORIDE 20 MEQ PO TBTQ [35943]: 60 meq | ORAL | @ 11:00:00 | Stop: 2020-05-23 | NDC 00245531989

## 2020-05-23 MED ADMIN — SIMETHICONE 80 MG PO CHEW [7227]: 80 mg | ORAL | @ 17:00:00 | NDC 70000043401

## 2020-05-23 MED ADMIN — POTASSIUM CHLORIDE IN WATER 10 MEQ/50 ML IV PGBK [11075]: 10 meq | INTRAVENOUS | @ 15:00:00 | Stop: 2020-05-23 | NDC 00338070541

## 2020-05-23 MED ADMIN — ALLOPURINOL 100 MG PO TAB [310]: 50 mg | ORAL | @ 23:00:00 | NDC 51079020501

## 2020-05-23 MED ADMIN — POTASSIUM CHLORIDE IN WATER 10 MEQ/50 ML IV PGBK [11075]: 10 meq | INTRAVENOUS | @ 16:00:00 | Stop: 2020-05-23 | NDC 00338070541

## 2020-05-23 NOTE — ED Notes
Report received from [place], [place]. Pt is alert & oriented x4, airway is patent, breathing is unlabored, & is resting at this time. Call light is within reach.

## 2020-05-23 NOTE — ED Notes
Report given.

## 2020-05-24 ENCOUNTER — Inpatient Hospital Stay
Admit: 2020-05-24 | Discharge: 2020-05-24 | Payer: MEDICARE | Primary: Student in an Organized Health Care Education/Training Program

## 2020-05-24 MED ADMIN — PERFLUTREN LIPID MICROSPHERES 1.1 MG/ML IV SUSP [79178]: 4 mL | INTRAVENOUS | @ 20:00:00 | Stop: 2020-05-24 | NDC 11994001116

## 2020-05-24 MED ADMIN — AMOXICILLIN-POT CLAVULANATE 500-125 MG PO TAB [33227]: 500 mg | ORAL | @ 23:00:00 | NDC 00093227434

## 2020-05-24 MED ADMIN — SODIUM CHLORIDE 0.9% IRRIGATION BAG [210330]: 3000 mL | @ 23:00:00 | NDC 00990797208

## 2020-05-24 MED ADMIN — LACTATED RINGERS IV SOLP [4318]: 1000 mL | INTRAVENOUS | @ 23:00:00 | Stop: 2020-05-25 | NDC 00338011704

## 2020-05-24 MED ADMIN — POTASSIUM CHLORIDE 20 MEQ PO TBTQ [35943]: 60 meq | ORAL | @ 14:00:00 | Stop: 2020-05-24 | NDC 00245531989

## 2020-05-25 ENCOUNTER — Inpatient Hospital Stay
Admit: 2020-05-25 | Discharge: 2020-05-25 | Payer: MEDICARE | Primary: Student in an Organized Health Care Education/Training Program

## 2020-05-25 MED ADMIN — SODIUM CHLORIDE 0.9% IRRIGATION BAG [210330]: 3000 mL | @ 07:00:00 | NDC 00338004747

## 2020-05-25 MED ADMIN — SODIUM CHLORIDE 0.9% IRRIGATION BAG [210330]: 3000 mL | @ 13:00:00 | NDC 00990797208

## 2020-05-25 MED ADMIN — SODIUM CHLORIDE 0.9% IRRIGATION BAG [210330]: 3000 mL | @ 18:00:00 | NDC 00338004747

## 2020-05-25 MED ADMIN — AMOXICILLIN-POT CLAVULANATE 500-125 MG PO TAB [33227]: 500 mg | ORAL | @ 14:00:00 | Stop: 2020-05-28 | NDC 00093227434

## 2020-05-25 MED ADMIN — AMOXICILLIN-POT CLAVULANATE 500-125 MG PO TAB [33227]: 500 mg | ORAL | @ 23:00:00 | Stop: 2020-05-28 | NDC 00093227434

## 2020-05-25 MED ADMIN — ALLOPURINOL 100 MG PO TAB [310]: 50 mg | ORAL | @ 21:00:00 | NDC 51079020501

## 2020-05-25 MED ADMIN — LACTATED RINGERS IV SOLP [4318]: 500 mL | INTRAVENOUS | @ 16:00:00 | Stop: 2020-05-25 | NDC 00338011704

## 2020-05-25 MED ADMIN — SODIUM CHLORIDE 0.9% IRRIGATION BAG [210330]: 3000 mL | @ 12:00:00 | NDC 00338004747

## 2020-05-25 MED ADMIN — SODIUM CHLORIDE 0.9% IRRIGATION BAG [210330]: 3000 mL | @ 20:00:00 | NDC 00990797208

## 2020-05-25 MED ADMIN — SODIUM CHLORIDE 0.9% IRRIGATION BAG [210330]: 3000 mL | @ 02:00:00 | NDC 00338004747

## 2020-05-25 MED ADMIN — SODIUM CHLORIDE 0.9% IRRIGATION BAG [210330]: 3000 mL | @ 21:00:00 | NDC 00990797208

## 2020-05-25 MED ADMIN — SODIUM CHLORIDE 0.9% IRRIGATION BAG [210330]: 3000 mL | @ 19:00:00 | NDC 00990797208

## 2020-05-25 MED ADMIN — SODIUM CHLORIDE 0.9% IRRIGATION BAG [210330]: 3000 mL | @ 11:00:00 | NDC 00338004747

## 2020-05-25 MED ADMIN — SODIUM CHLORIDE 0.9% IRRIGATION BAG [210330]: 3000 mL | @ 14:00:00 | NDC 00990797208

## 2020-05-25 MED ADMIN — SODIUM CHLORIDE 0.9% IRRIGATION BAG [210330]: 3000 mL | @ 05:00:00 | NDC 00338004747

## 2020-05-25 MED ADMIN — SODIUM CHLORIDE 0.9% IRRIGATION BAG [210330]: 3000 mL | @ 23:00:00 | NDC 00338004747

## 2020-05-25 MED ADMIN — SODIUM CHLORIDE 0.9% IRRIGATION BAG [210330]: 3000 mL | @ 17:00:00 | NDC 00990797208

## 2020-05-26 MED ADMIN — SODIUM CHLORIDE 0.9% IRRIGATION BAG [210330]: 3000 mL | @ 01:00:00 | NDC 00338004747

## 2020-05-26 MED ADMIN — SODIUM CHLORIDE 0.9% IRRIGATION BAG [210330]: 3000 mL | @ 08:00:00 | NDC 00338004747

## 2020-05-26 MED ADMIN — SODIUM CHLORIDE 0.9% IRRIGATION BAG [210330]: 3000 mL | @ 04:00:00 | NDC 00338004747

## 2020-05-26 MED ADMIN — AMOXICILLIN-POT CLAVULANATE 500-125 MG PO TAB [33227]: 500 mg | ORAL | Stop: 2020-05-28 | NDC 00093227434

## 2020-05-26 MED ADMIN — AMOXICILLIN-POT CLAVULANATE 500-125 MG PO TAB [33227]: 500 mg | ORAL | @ 14:00:00 | Stop: 2020-05-28 | NDC 00093227434

## 2020-05-26 MED ADMIN — SODIUM CHLORIDE 0.9% IRRIGATION BAG [210330]: 3000 mL | @ 05:00:00 | NDC 00338004747

## 2020-05-27 MED ADMIN — TAMSULOSIN 0.4 MG PO CAP [80077]: 0.4 mg | ORAL | @ 22:00:00 | NDC 00904640161

## 2020-05-27 MED ADMIN — AMOXICILLIN-POT CLAVULANATE 500-125 MG PO TAB [33227]: 500 mg | ORAL | @ 14:00:00 | Stop: 2020-05-27 | NDC 00093227434

## 2020-05-27 MED ADMIN — METOPROLOL SUCCINATE 25 MG PO TB24 [81866]: 12.5 mg | ORAL | @ 01:00:00 | NDC 00904632261

## 2020-05-27 MED ADMIN — POTASSIUM CHLORIDE 20 MEQ PO TBTQ [35943]: 20 meq | ORAL | @ 17:00:00 | Stop: 2020-05-27 | NDC 00245531989

## 2020-05-27 MED ADMIN — AMOXICILLIN-POT CLAVULANATE 875-125 MG PO TAB [33228]: 875 mg | ORAL | @ 22:00:00 | Stop: 2020-05-28 | NDC 65862050301

## 2020-05-27 MED ADMIN — ALLOPURINOL 100 MG PO TAB [310]: 50 mg | ORAL | @ 22:00:00 | NDC 51079020501

## 2020-05-27 MED ADMIN — SODIUM CHLORIDE 0.9% IRRIGATION BAG [210330]: 3000 mL | @ 03:00:00 | NDC 00338004747

## 2020-05-28 ENCOUNTER — Encounter
Admit: 2020-05-28 | Discharge: 2020-05-28 | Payer: MEDICARE | Primary: Student in an Organized Health Care Education/Training Program

## 2020-05-28 ENCOUNTER — Inpatient Hospital Stay
Admit: 2020-05-28 | Discharge: 2020-05-28 | Payer: MEDICARE | Primary: Student in an Organized Health Care Education/Training Program

## 2020-05-28 MED ADMIN — FINASTERIDE 5 MG PO TAB [82679]: 5 mg | ORAL | @ 01:00:00 | NDC 16729009001

## 2020-05-28 MED ADMIN — AMOXICILLIN-POT CLAVULANATE 875-125 MG PO TAB [33228]: 875 mg | ORAL | @ 13:00:00 | Stop: 2020-05-28 | NDC 65862050301

## 2020-05-28 MED ADMIN — POLYETHYLENE GLYCOL 3350 17 GRAM PO PWPK [25424]: 17 g | ORAL | @ 20:00:00 | NDC 00904693186

## 2020-05-28 MED ADMIN — TAMSULOSIN 0.4 MG PO CAP [80077]: 0.4 mg | ORAL | @ 13:00:00 | NDC 00904640161

## 2020-05-28 MED ADMIN — METOPROLOL SUCCINATE 25 MG PO TB24 [81866]: 12.5 mg | ORAL | @ 01:00:00 | NDC 00904632261

## 2020-05-28 MED ADMIN — SODIUM CHLORIDE 0.9 % IJ SOLN [7319]: 50 mL | INTRAVENOUS | @ 17:00:00 | Stop: 2020-05-28 | NDC 00409488820

## 2020-05-28 MED ADMIN — SENNOSIDES-DOCUSATE SODIUM 8.6-50 MG PO TAB [40926]: 1 | ORAL | @ 20:00:00 | NDC 57896055510

## 2020-05-28 MED ADMIN — IOHEXOL 300 MG IODINE/ML IV SOLN [79156]: 100 mL | INTRAVENOUS | @ 17:00:00 | Stop: 2020-05-28 | NDC 00407141363

## 2020-05-28 MED ADMIN — FINASTERIDE 5 MG PO TAB [82679]: 5 mg | ORAL | @ 13:00:00 | NDC 16729009001

## 2020-05-28 MED ADMIN — POTASSIUM CHLORIDE 20 MEQ PO TBTQ [35943]: 20 meq | ORAL | @ 20:00:00 | Stop: 2020-05-28 | NDC 00245531989

## 2020-05-29 MED ADMIN — SENNOSIDES-DOCUSATE SODIUM 8.6-50 MG PO TAB [40926]: 1 | ORAL | @ 13:00:00 | NDC 57896055510

## 2020-05-29 MED ADMIN — TAMSULOSIN 0.4 MG PO CAP [80077]: 0.4 mg | ORAL | @ 13:00:00 | NDC 00904640161

## 2020-05-29 MED ADMIN — FINASTERIDE 5 MG PO TAB [82679]: 5 mg | ORAL | @ 13:00:00 | NDC 16729009001

## 2020-05-29 MED ADMIN — CEFTRIAXONE INJ 1GM IVP [210253]: 1 g | INTRAVENOUS | @ 13:00:00 | Stop: 2020-06-03 | NDC 00409733211

## 2020-05-29 MED ADMIN — POLYETHYLENE GLYCOL 3350 17 GRAM PO PWPK [25424]: 17 g | ORAL | @ 13:00:00 | NDC 00904693186

## 2020-05-29 MED ADMIN — METOPROLOL SUCCINATE 25 MG PO TB24 [81866]: 12.5 mg | ORAL | @ 01:00:00 | NDC 00904632261

## 2020-05-30 MED ADMIN — CEFTRIAXONE INJ 1GM IVP [210253]: 1 g | INTRAVENOUS | @ 13:00:00 | Stop: 2020-05-30 | NDC 00409733211

## 2020-05-30 MED ADMIN — SENNOSIDES-DOCUSATE SODIUM 8.6-50 MG PO TAB [40926]: 1 | ORAL | @ 13:00:00 | NDC 57896055510

## 2020-05-30 MED ADMIN — ALLOPURINOL 100 MG PO TAB [310]: 100 mg | ORAL | @ 01:00:00 | NDC 51079020501

## 2020-05-30 MED ADMIN — FINASTERIDE 5 MG PO TAB [82679]: 5 mg | ORAL | @ 13:00:00 | NDC 16729009001

## 2020-05-30 MED ADMIN — POLYETHYLENE GLYCOL 3350 17 GRAM PO PWPK [25424]: 17 g | ORAL | @ 13:00:00 | NDC 00904693186

## 2020-05-30 MED ADMIN — METOPROLOL SUCCINATE 25 MG PO TB24 [81866]: 12.5 mg | ORAL | @ 01:00:00 | NDC 00904632261

## 2020-05-30 MED ADMIN — TAMSULOSIN 0.4 MG PO CAP [80077]: 0.4 mg | ORAL | @ 13:00:00 | NDC 00904640161

## 2020-05-31 MED ADMIN — POLYETHYLENE GLYCOL 3350 17 GRAM PO PWPK [25424]: 17 g | ORAL | @ 14:00:00 | Stop: 2020-05-31 | NDC 00904693186

## 2020-05-31 MED ADMIN — SENNOSIDES-DOCUSATE SODIUM 8.6-50 MG PO TAB [40926]: 1 | ORAL | @ 14:00:00 | Stop: 2020-05-31 | NDC 57896055510

## 2020-05-31 MED ADMIN — ALLOPURINOL 100 MG PO TAB [310]: 100 mg | ORAL | @ 01:00:00 | NDC 51079020501

## 2020-05-31 MED ADMIN — FINASTERIDE 5 MG PO TAB [82679]: 5 mg | ORAL | @ 14:00:00 | Stop: 2020-05-31 | NDC 16729009001

## 2020-05-31 MED ADMIN — TAMSULOSIN 0.4 MG PO CAP [80077]: 0.4 mg | ORAL | @ 14:00:00 | Stop: 2020-05-31 | NDC 00904640161

## 2020-06-01 ENCOUNTER — Encounter
Admit: 2020-06-01 | Discharge: 2020-06-01 | Payer: MEDICARE | Primary: Student in an Organized Health Care Education/Training Program

## 2020-06-01 NOTE — Telephone Encounter
Hospital Discharge Follow Up      Reached Patient:No, transferred to Skilled Nursing Facility, [facility]) SNF     Admission Information:     Hospital Name: [facility]  Admission Date: 05/22/2020  Discharge Date: 05/31/2020    Admission Diagnosis: Diverticulitis  Discharge Diagnosis: Diverticulitis, AKI superimposed on CKD, Chronic HFrEF, Cognitive dysfuncion, Cognitive impairment, Colitis, Coronary artery disease involving native coronary artery of native heart without angina pectoris, Debility Heart failure, Hematuria, Hyponatremia, Hypothryoidism, Ischemic cardiomyopathy, Paroxysmal atrial fibrillation, Sever malnutrition, Stage 3b chronic kidney disease, Lactic acidosis, Gross hematuria, Cardiorenal syndrome with renal failure stage 1-4 or unspecified chronic kidney disease with heart failure, Urinary retention  Has there been a discharge within the last 30 days? No  If yes, reason: N/A  Hospital Services: Unplanned  Today's call is 1(business) days post discharge    Meds reviewed and reconciled?Medications reviewed but not reconciled due to did not speak with patient.  ? acetaminophen (TYLENOL EXTRA STRENGTH) 500 mg tablet Take one tablet by mouth every 6 hours as needed. Max of 4,000 mg of acetaminophen in 24 hours.   ? allopurinoL (ZYLOPRIM) 100 mg tablet TAKE 1 TABLET BY MOUTH EVERY NIGHT AT BEDTIME WITH FOOD   ? amiodarone (CORDARONE) 200 mg tablet Take one tablet by mouth daily. Take with food.   ? aspirin (ASPIRIN CHILDRENS) 81 mg chewable tablet DO NOT START UNTIL AFTER FOLLOW UP WITH CARDIOLOGY ON 06/02/2020 (changed from 325 mg daily and held due to hematuria).   ? bumetanide (BUMEX) 1 mg tablet Take 1 mg of Bumex daily as needed for weight gain > 2 lbs in 24 hours or > 4 lbs in 48 hours or > 5 lbs in one week.  Indications: accumulation of fluid resulting from chronic heart failure, fluid in the lungs due to chronic heart failure   ? docusate (COLACE) 100 mg capsule Take 100 mg by mouth twice daily.   ? ergocalciferol (VITAMIN D-2) 1,250 mcg (50,000 unit) capsule TAKE 1 CAPSULE BY MOUTH 1 TIME A WEEK   ? finasteride (PROSCAR) 5 mg tablet Take one tablet by mouth daily.   ? ketoconazole (NIZORAL) 2 % topical cream Apply  topically to affected area twice daily.   ? levothyroxine (SYNTHROID) 50 mcg tablet TAKE 1 TABLET BY MOUTH EVERY DAY 30 MINUTES BEFORE BREAKFAST   ? metoprolol XL (TOPROL XL) 25 mg extended release tablet Take one-half tablet by mouth daily.   ? nystatin (NYSTOP) 100,000 unit/g topical powder Apply  topically to affected area twice daily.   ? ondansetron HCL (ZOFRAN) 4 mg tablet Take 4 mg by mouth every 6 hours as needed for Nausea or Vomiting.   ? polyethylene glycol 3350 (MIRALAX) 17 g packet Take one packet by mouth daily as needed (Constipation - Second Line). (Patient taking differently: Take 17 g by mouth daily.)   ? senna/docusate (SENOKOT-S) 8.6/50 mg tablet Take one tablet by mouth daily as needed (Constipation - First Line). (Patient taking differently: Take 1 tablet by mouth daily.)   ? spironolactone (ALDACTONE) 25 mg tablet HOLD UNTIL FOLLOW UP APPOINTMENT WITH CARDIOLOGY ON 06/02/2020.   ? tamsulosin (FLOMAX) 0.4 mg capsule Take one capsule by mouth daily after breakfast. Do not crush, chew or open capsules. Take 30 minutes following the same meal each day.   ? thiamine (VITAMIN B-1) 100 mg tablet Take one tablet by mouth daily.     Scheduling Follow-up Appointment   Upcoming appointment date and time and with whom  scheduled:   Future Appointments   Date Time Provider Department Center   06/02/2020  3:30 PM Jiles Crocker [id number] CVM Exam   06/28/2020  1:15 PM Delia Heady, MD [facility] Urology   07/03/2020 10:00 AM Juliann Pares, DO [facility]     PCP appointment scheduled?Yes, Date: 07/03/2020   PCP primary location: [facility]  Specialist appointment scheduled? Yes, with Cardiology 06/02/2020; Urology 06/28/2020  Both PCP and Specialist appointment scheduled: Yes    MyChart message sent? Inactive in MyChart.    Yancey Flemings, RN

## 2020-06-01 NOTE — Telephone Encounter
No HF post discharge call attempted as pt is a [facility] PCP.  Natale Milch, [place], DO).  D/C 05/31/20.  Next HF visit 06/02/20 @ 1530.  Cala Bradford, RN

## 2020-06-02 ENCOUNTER — Encounter
Admit: 2020-06-02 | Discharge: 2020-06-02 | Payer: MEDICARE | Primary: Student in an Organized Health Care Education/Training Program

## 2020-06-02 ENCOUNTER — Ambulatory Visit
Admit: 2020-06-02 | Discharge: 2020-06-03 | Payer: MEDICARE | Primary: Student in an Organized Health Care Education/Training Program

## 2020-06-02 DIAGNOSIS — M1990 Unspecified osteoarthritis, unspecified site: Secondary | ICD-10-CM

## 2020-06-02 DIAGNOSIS — I255 Ischemic cardiomyopathy: Secondary | ICD-10-CM

## 2020-06-02 DIAGNOSIS — E785 Hyperlipidemia, unspecified: Secondary | ICD-10-CM

## 2020-06-02 DIAGNOSIS — Z95810 Presence of automatic (implantable) cardiac defibrillator: Secondary | ICD-10-CM

## 2020-06-02 DIAGNOSIS — I472 Ventricular tachycardia: Secondary | ICD-10-CM

## 2020-06-02 DIAGNOSIS — R55 Syncope and collapse: Secondary | ICD-10-CM

## 2020-06-02 DIAGNOSIS — T148XXA Other injury of unspecified body region, initial encounter: Secondary | ICD-10-CM

## 2020-06-02 DIAGNOSIS — I10 Essential (primary) hypertension: Secondary | ICD-10-CM

## 2020-06-02 DIAGNOSIS — I5022 Chronic systolic (congestive) heart failure: Secondary | ICD-10-CM

## 2020-06-02 DIAGNOSIS — Z955 Presence of coronary angioplasty implant and graft: Secondary | ICD-10-CM

## 2020-06-02 DIAGNOSIS — I2510 Atherosclerotic heart disease of native coronary artery without angina pectoris: Secondary | ICD-10-CM

## 2020-06-02 DIAGNOSIS — I5043 Acute on chronic combined systolic (congestive) and diastolic (congestive) heart failure: Secondary | ICD-10-CM

## 2020-06-02 DIAGNOSIS — I480 Paroxysmal atrial fibrillation: Secondary | ICD-10-CM

## 2020-06-02 DIAGNOSIS — N289 Disorder of kidney and ureter, unspecified: Secondary | ICD-10-CM

## 2020-06-02 DIAGNOSIS — Z8679 Personal history of other diseases of the circulatory system: Secondary | ICD-10-CM

## 2020-06-02 MED ORDER — METOPROLOL SUCCINATE 25 MG PO TB24
12.5 mg | ORAL_TABLET | Freq: Every day | ORAL | 3 refills | 90.00000 days | Status: AC
Start: 2020-06-02 — End: ?

## 2020-06-02 MED ORDER — SPIRONOLACTONE 25 MG PO TAB
12.5 mg | ORAL_TABLET | Freq: Every day | ORAL | 1 refills | 46.00000 days | Status: AC
Start: 2020-06-02 — End: ?

## 2020-06-04 ENCOUNTER — Encounter
Admit: 2020-06-04 | Discharge: 2020-06-04 | Payer: MEDICARE | Primary: Student in an Organized Health Care Education/Training Program

## 2020-06-04 DIAGNOSIS — T148XXA Other injury of unspecified body region, initial encounter: Secondary | ICD-10-CM

## 2020-06-04 DIAGNOSIS — E785 Hyperlipidemia, unspecified: Secondary | ICD-10-CM

## 2020-06-04 DIAGNOSIS — Z8679 Personal history of other diseases of the circulatory system: Secondary | ICD-10-CM

## 2020-06-04 DIAGNOSIS — M1990 Unspecified osteoarthritis, unspecified site: Secondary | ICD-10-CM

## 2020-06-04 DIAGNOSIS — Z955 Presence of coronary angioplasty implant and graft: Secondary | ICD-10-CM

## 2020-06-04 DIAGNOSIS — R55 Syncope and collapse: Secondary | ICD-10-CM

## 2020-06-04 DIAGNOSIS — I10 Essential (primary) hypertension: Secondary | ICD-10-CM

## 2020-06-04 DIAGNOSIS — Z95810 Presence of automatic (implantable) cardiac defibrillator: Secondary | ICD-10-CM

## 2020-06-04 DIAGNOSIS — I2510 Atherosclerotic heart disease of native coronary artery without angina pectoris: Secondary | ICD-10-CM

## 2020-06-04 DIAGNOSIS — N289 Disorder of kidney and ureter, unspecified: Secondary | ICD-10-CM

## 2020-06-04 DIAGNOSIS — I255 Ischemic cardiomyopathy: Secondary | ICD-10-CM

## 2020-06-28 ENCOUNTER — Encounter: Admit: 2020-06-28 | Discharge: 2020-06-28 | Payer: MEDICARE

## 2020-06-28 ENCOUNTER — Ambulatory Visit: Admit: 2020-06-28 | Discharge: 2020-06-29 | Payer: MEDICARE

## 2020-06-28 DIAGNOSIS — T148XXA Other injury of unspecified body region, initial encounter: Secondary | ICD-10-CM

## 2020-06-28 DIAGNOSIS — Z955 Presence of coronary angioplasty implant and graft: Secondary | ICD-10-CM

## 2020-06-28 DIAGNOSIS — M1990 Unspecified osteoarthritis, unspecified site: Secondary | ICD-10-CM

## 2020-06-28 DIAGNOSIS — R55 Syncope and collapse: Secondary | ICD-10-CM

## 2020-06-28 DIAGNOSIS — Z95810 Presence of automatic (implantable) cardiac defibrillator: Secondary | ICD-10-CM

## 2020-06-28 DIAGNOSIS — I255 Ischemic cardiomyopathy: Secondary | ICD-10-CM

## 2020-06-28 DIAGNOSIS — Z8679 Personal history of other diseases of the circulatory system: Secondary | ICD-10-CM

## 2020-06-28 DIAGNOSIS — N289 Disorder of kidney and ureter, unspecified: Secondary | ICD-10-CM

## 2020-06-28 DIAGNOSIS — E785 Hyperlipidemia, unspecified: Secondary | ICD-10-CM

## 2020-06-28 DIAGNOSIS — R319 Hematuria, unspecified: Secondary | ICD-10-CM

## 2020-06-28 DIAGNOSIS — I2510 Atherosclerotic heart disease of native coronary artery without angina pectoris: Secondary | ICD-10-CM

## 2020-06-28 DIAGNOSIS — I10 Essential (primary) hypertension: Secondary | ICD-10-CM

## 2020-06-28 MED ORDER — CIPROFLOXACIN HCL 500 MG PO TAB
500 mg | Freq: Once | ORAL | 0 refills | Status: CP
Start: 2020-06-28 — End: ?
  Administered 2020-06-28: 19:00:00 500 mg via ORAL

## 2020-07-03 ENCOUNTER — Ambulatory Visit: Admit: 2020-07-03 | Discharge: 2020-07-03 | Payer: MEDICARE

## 2020-07-03 DIAGNOSIS — I5022 Chronic systolic (congestive) heart failure: Secondary | ICD-10-CM

## 2020-07-03 DIAGNOSIS — E559 Vitamin D deficiency, unspecified: Secondary | ICD-10-CM

## 2020-07-03 DIAGNOSIS — I255 Ischemic cardiomyopathy: Secondary | ICD-10-CM

## 2020-07-03 NOTE — Progress Notes
I discussed the care of Logan Bright with Dr. Erle Crocker, DO at the time of the visit, and I agree with the evaluation and plan as documented by this resident, unless noted below, or in the resident's note in bold italics.    Vitals:    07/03/20 0942   BP: 108/64   Pulse: 69   Temp: 36.6 C (97.9 F)   Resp: 21   SpO2: 98%     Wt Readings from Last 3 Encounters:   07/03/20 83.4 kg (183 lb 14.4 oz)   06/28/20 86.2 kg (190 lb)   06/02/20 84 kg (185 lb 3.2 oz)       Problem   Severe Malnutrition (Hcc) (Resolved)   Acute Respiratory Failure With Hypoxia (Hcc) (Resolved)     Encounter Diagnoses   Name Primary?    Ischemic cardiomyopathy Yes    Hypovitaminosis D     Chronic HFrEF (heart failure with reduced ejection fraction) (HCC)         Vitamin D(25-OH)Total (NG/ML)   Date Value   03/30/2020 42.6       Orders Placed This Engineering geologist (COVID-19) SARS-CoV-2 Vaccine (>= 12 y/o)    25-OH VITAMIN D (D2 + D3)     Return in about 3 months (around 10/03/2020).  Future Appointments   Date Time Provider [place]   07/28/2020  2:30 PM Vernell Leep [facility] CVM Exam   08/02/2020  2:15 PM Lennie Hummer, MD [facility] Urology     Bertis Ruddy, MD

## 2020-07-03 NOTE — Progress Notes
Date of Service: 07/03/2020    Subjective:  DOB: 07-24-1935   MRN#: [id number]    Logan Bright is an 85yo?male?with pmhx?of Chronic combined Systolic and Diastolic heart failure (EF 15-20% 07/03/19), Ischemic cardiomyopathy s/p CRT-D, CAD s/p PCI, H/o VT s/p VT ablation, Afib on Amiodarone (not on AC 2/2 hematuria and syncope), HTN, CKD, Hypothyroid, and gout here for follow up.     He was recently hospitalized for diverticulitis. He has completed antibiotic course and his symptoms have resolved. This admission was c/b hematuria and AKI. Renal function returned to baseline prior to d/c. He had foley placed and it was removed about 1 week ago. He has not  Had recurrent hematuria.    He has been [place] at [facility]. He reports this is going well. He feels at his baseline today. He denies  any SOB, heart palpitations, chest pain, orthopnea, PND    His medication list does not show that he has been receiving spironolactone. He is unsure if he has received and prn bumex.    Objective:     ? acetaminophen (TYLENOL EXTRA STRENGTH) 500 mg tablet Take one tablet by mouth every 6 hours as needed. Max of 4,000 mg of acetaminophen in 24 hours.   ? allopurinoL (ZYLOPRIM) 100 mg tablet TAKE 1 TABLET BY MOUTH EVERY NIGHT AT BEDTIME WITH FOOD   ? amiodarone (CORDARONE) 200 mg tablet Take one tablet by mouth daily. Take with food.   ? bumetanide (BUMEX) 1 mg tablet Take 1 mg of Bumex daily as needed for weight gain > 2 lbs in 24 hours or > 4 lbs in 48 hours or > 5 lbs in one week.  Indications: accumulation of fluid resulting from chronic heart failure, fluid in the lungs due to chronic heart failure   ? docusate (COLACE) 100 mg capsule Take 100 mg by mouth twice daily.   ? ergocalciferol (VITAMIN D-2) 1,250 mcg (50,000 unit) capsule TAKE 1 CAPSULE BY MOUTH 1 TIME A WEEK   ? finasteride (PROSCAR) 5 mg tablet Take one tablet by mouth daily.   ? ketoconazole (NIZORAL) 2 % topical cream Apply  topically to affected area twice daily.   ? levothyroxine (SYNTHROID) 50 mcg tablet TAKE 1 TABLET BY MOUTH EVERY DAY 30 MINUTES BEFORE BREAKFAST   ? metoprolol XL (TOPROL XL) 25 mg extended release tablet Take one-half tablet by mouth daily. HOLD IF SBP < 100   ? nystatin (NYSTOP) 100,000 unit/g topical powder Apply  topically to affected area twice daily.   ? ondansetron HCL (ZOFRAN) 4 mg tablet Take 4 mg by mouth every 6 hours as needed for Nausea or Vomiting.   ? polyethylene glycol 3350 (MIRALAX) 17 g packet Take one packet by mouth daily as needed (Constipation - Second Line). (Patient taking differently: Take 17 g by mouth daily.)   ? senna/docusate (SENOKOT-S) 8.6/50 mg tablet Take one tablet by mouth daily as needed (Constipation - First Line). (Patient taking differently: Take 1 tablet by mouth as Needed.)   ? spironolactone (ALDACTONE) 25 mg tablet Take one-half tablet by mouth daily. Take with food.   ? spironolactone (ALDACTONE) 25 mg tablet HOLD UNTIL FOLLOW UP APPOINTMENT WITH CARDIOLOGY ON 06/02/2020.   ? tamsulosin (FLOMAX) 0.4 mg capsule Take one capsule by mouth daily after breakfast. Do not crush, chew or open capsules. Take 30 minutes following the same meal each day.   ? thiamine (VITAMIN B-1) 100 mg tablet Take one tablet by mouth daily.     Vitals:  07/03/20 0942   BP: 108/64   BP Source: Arm, Right Upper   Pulse: 69   Temp: 36.6 ?C (97.9 ?F)   Resp: 21   SpO2: 98%   TempSrc: Oral   PainSc: Zero   Weight: 83.4 kg (183 lb 14.4 oz)   Height: 182.9 cm (6' 0.01)     Body mass index is 24.94 kg/m?[name]     Wt Readings from Last 5 Encounters:   07/03/20 83.4 kg (183 lb 14.4 oz)   06/28/20 86.2 kg (190 lb)   06/02/20 84 kg (185 lb 3.2 oz)   05/31/20 79.2 kg (174 lb 9.6 oz)   04/07/20 94.8 kg (209 lb)         Constitutional: Alert and oriented x4; no apparent distress;   Neuro: Grossly intact, no focal deficits noted  HEENT: Normocephalic, atraumatic; EOMI; Moist mucus membranes  Neck: No JVD  Pulm: No respiratory distress; CTA bilaterally without wheezes, rales, or rhonchi  CV: Regular rate and rhythm; no murmurs or rubs appreciated  GI: Non-distended;  Skin: No evident rash or jaundice  Extremities: no edema  Heme: No evidence of active bleeding; no excessive bruising         Assessment and Plan:      Chronic combined systolic and diastolic heart failure with reduced ejection fraction  Ischemic cardiomyopathy  HTN  -Echo:left ventricular dilation with severely impaired systolic function, estimated LVEF 15-20% with diastolic dysfunction. Multiple wall motion abnormalities  - follows with Dr. Barry Dienes  - has been unable to tolerate higher doses of GDMT due to BP / syncope  - appears compensated at today's visit  Plan  - Bumex prn  - continue toprol XL 12.5 mg  ?  PAF  - rhythm control strategy with amiodarone  - No a/c due to falls / hematuria  ?  CKD Stage III  - Baseline ~ 1.4-1.6  ?  Gout  - no recent flares  - continue allopurinol  ?  Hypothyroidism   - continue levothyroxine 50 mcg  ?  Hypovitaminosis D  - Vit D 42 3/3  - continue ergocalciferol for now  - recheck vitamin D level    Hematuria  - foley removed 6/1  - no further bleeding noted    HM  - COVID-19 booster today    Return in about 3 months (around 10/03/2020).    Patient discussed with Dr. Dwaine Gale.  Juliann Pares, DO  Internal Medicine, PGY-3   Pager: [phone]  Available on [employer]    Patient Instructions     Orders Placed This Visit:    Orders Placed This Encounter   ? Pfizer (COVID-19) SARS-CoV-2 Vaccine (>= 110 y/o)   ? 25-OH VITAMIN D (D2 + D3)

## 2020-07-03 NOTE — Progress Notes
Nurse administered Pfizer COVID-19 booster immunization in patient's right deltoid. Patient tolerated well.     Eyad Rochford, RN

## 2020-07-03 NOTE — Patient Instructions
Orders Placed This Visit:    Orders Placed This Engineering geologist (COVID-19) SARS-CoV-2 Vaccine (>= 83 y/o)    25-OH VITAMIN D (D2 + D3)

## 2020-08-02 ENCOUNTER — Encounter: Admit: 2020-08-02 | Discharge: 2020-08-02 | Payer: MEDICARE

## 2020-08-08 ENCOUNTER — Encounter: Admit: 2020-08-08 | Discharge: 2020-08-08 | Payer: MEDICARE

## 2020-08-30 ENCOUNTER — Encounter: Admit: 2020-08-30 | Discharge: 2020-08-30 | Payer: MEDICARE

## 2020-08-30 NOTE — Telephone Encounter
Patient was scheduled for a [employer] Carelink on 08/24/20 that has not been received. Patient was instructed to send a manual transmission. Instructed if the transmitter does not appear to be working properly, they need to contact the device company directly. Patient was provided with that contact number. Requested the patient send [name] a MyChart message or contact our device nurses at [phone] to let us know after they have sent their transmission. Patient is currently in [facility] home in [place]. His monitor is not currently with him. His DPOA stated that she will take the monitor to him this week and get it all set up for transmission to be sent over.   BC     Note: Patient needs to send a manual transmission as we did not receive their scheduled remote interrogation.

## 2020-11-07 ENCOUNTER — Encounter: Admit: 2020-11-07 | Discharge: 2020-11-07 | Payer: MEDICARE

## 2020-11-07 NOTE — Telephone Encounter
Pt's daughter left a msg on device VM.  Returned her call.  She reported that the pt is currently in a rehab facility but he does have his remote transmitter next to his bed.  She requested I speak with his nurse to instruct her on how to send in a remote.  I called [phone] spoke with the nurse from [facility] and she reported that the transmitter battery is dead and is currently charging.  She will send in a remote transmission when the battery has charged.  Sent an inbasket message to follow-up to make sure we received the remote in a few days.

## 2020-12-01 ENCOUNTER — Encounter: Admit: 2020-12-01 | Discharge: 2020-12-01 | Payer: MEDICARE

## 2020-12-01 DIAGNOSIS — I480 Paroxysmal atrial fibrillation: Secondary | ICD-10-CM

## 2020-12-01 DIAGNOSIS — I5043 Acute on chronic combined systolic (congestive) and diastolic (congestive) heart failure: Secondary | ICD-10-CM

## 2020-12-01 DIAGNOSIS — Z79899 Other long term (current) drug therapy: Secondary | ICD-10-CM

## 2020-12-01 DIAGNOSIS — I255 Ischemic cardiomyopathy: Secondary | ICD-10-CM

## 2020-12-01 NOTE — Progress Notes
Labs and/or CXR for amiodarone surveillance due per Amiodarone protocol.  Lab requisitions/orders and letter explaining need for testing mailed to patient or given to patient in clinic.

## 2021-01-04 ENCOUNTER — Encounter: Admit: 2021-01-04 | Discharge: 2021-01-04 | Payer: MEDICARE

## 2021-01-04 NOTE — Telephone Encounter
Peter Congo from [facility] called about patient's Carelink monitor. I called and gave her number to Carelink to set up monitor.

## 2021-01-05 ENCOUNTER — Encounter: Admit: 2021-01-05 | Discharge: 2021-01-05 | Payer: MEDICARE

## 2021-01-05 DIAGNOSIS — I480 Paroxysmal atrial fibrillation: Secondary | ICD-10-CM

## 2021-01-05 NOTE — Telephone Encounter
I spoke with his daughter Pamala Hurry on the phone. She stated his home monitor is not holding a charge which is why he has not been able to send transmissions. I called [employer] Monitor assist and they were able to call Pamala Hurry to troubleshoot the monitor and ordered a new one to be sent to them. It will take 7-10 business days for him to receive the new monitor.

## 2021-02-05 ENCOUNTER — Encounter: Admit: 2021-02-05 | Discharge: 2021-02-05 | Payer: MEDICARE

## 2021-02-05 NOTE — Telephone Encounter
Logan Bright, daughter called and stated she brought the remote monitor to nursing home.  She is out of town until Friday so won't be able to help with sending transmission in until than

## 2021-02-06 ENCOUNTER — Encounter: Admit: 2021-02-06 | Discharge: 2021-02-06 | Payer: MEDICARE

## 2021-02-06 DIAGNOSIS — Z8679 Personal history of other diseases of the circulatory system: Secondary | ICD-10-CM

## 2021-02-06 DIAGNOSIS — I2510 Atherosclerotic heart disease of native coronary artery without angina pectoris: Secondary | ICD-10-CM

## 2021-02-06 DIAGNOSIS — I10 Essential (primary) hypertension: Secondary | ICD-10-CM

## 2021-02-06 DIAGNOSIS — I5022 Chronic systolic (congestive) heart failure: Secondary | ICD-10-CM

## 2021-02-06 DIAGNOSIS — M1990 Unspecified osteoarthritis, unspecified site: Secondary | ICD-10-CM

## 2021-02-06 DIAGNOSIS — E785 Hyperlipidemia, unspecified: Secondary | ICD-10-CM

## 2021-02-06 DIAGNOSIS — T148XXA Other injury of unspecified body region, initial encounter: Secondary | ICD-10-CM

## 2021-02-06 DIAGNOSIS — R55 Syncope and collapse: Secondary | ICD-10-CM

## 2021-02-06 DIAGNOSIS — N289 Disorder of kidney and ureter, unspecified: Secondary | ICD-10-CM

## 2021-02-06 DIAGNOSIS — Z95810 Presence of automatic (implantable) cardiac defibrillator: Secondary | ICD-10-CM

## 2021-02-06 DIAGNOSIS — I480 Paroxysmal atrial fibrillation: Secondary | ICD-10-CM

## 2021-02-06 DIAGNOSIS — I255 Ischemic cardiomyopathy: Secondary | ICD-10-CM

## 2021-02-06 DIAGNOSIS — I472 Recurrent ventricular tachycardia: Secondary | ICD-10-CM

## 2021-02-06 DIAGNOSIS — Z955 Presence of coronary angioplasty implant and graft: Secondary | ICD-10-CM

## 2021-02-06 NOTE — Progress Notes
Date of Service: 02/06/2021    Logan Bright is a 86 y.o. male.       HPI     Logan Bright was in the [facility] clinic today for follow-up regarding cardiomyopathy and ICD.  He was dressed in a coat and tie with his tennis shoes.  He says that he has nine girlfriends at the assisted living facility.  He seems to be very happy there!    He denies any problems with chest discomfort or breathlessness.  He has had no palpitations, syncope, or near syncope.  He denies any TIA or stroke symptoms.  He is unaware of any ICD shocks.         Vitals:    02/06/21 0909   BP: 118/72   BP Source: Arm, Left Upper   Pulse: 82   SpO2: 95%   O2 Device: None (Room air)   PainSc: Zero   Weight: 92.6 kg (204 lb 3.2 oz)   Height: 182.9 cm (6')     Body mass index is 27.69 kg/m?[name]     Past Medical History  Patient Active Problem List    Diagnosis Date Noted   ? Hypovitaminosis D 07/03/2020   ? Colitis 05/26/2020   ? Hematuria 05/26/2020   ? Acute kidney injury superimposed on CKD (HCC) 05/26/2020   ? Coronary artery disease involving native coronary artery of native heart without angina pectoris 05/23/2020   ? Diverticulitis 05/22/2020   ? Acute on chronic combined systolic and diastolic congestive heart failure (HCC) 03/30/2020   ? Cognitive impairment 03/30/2020   ? Cellulitis of scrotum 03/30/2020   ? Stage 3b chronic kidney disease (HCC) 03/30/2020   ? Cognitive dysfunction 03/09/2020   ? Shortness of breath 10/29/2019   ? Heart failure exacerbated by sotalol (HCC) 10/27/2019   ? Heart failure (HCC) 07/01/2019   ? History of polycythemia 12/14/2018   ? Hyponatremia 09/04/2018   ? Hypothyroidism 09/04/2018   ? Paroxysmal atrial fibrillation (HCC) 04/07/2018   ? 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 No hemodynamically significant common or internal carotid artery stenosis.     ? Closed trimalleolar fracture of right ankle 11/12/2015   ? Debility 06/10/2013   ? Recurrent ventricular tachycardia w/ 2 VT ablations 04/2013 05/11/2013     Sustained VT w/ multiple ICD tachypacing and Shocks, failed amiodaron   05/05/13 & 05/12/13  VT ablations [facility] w/ impella support 4/15, Home off Coreg and losartan d/t post procedure hematoma, hypotension     ? AKI (acute kidney injury) (HCC) 04/16/2013   ? 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 Ermelinda Das.        ? Ischemic cardiomyopathy      6/02 - infarct 2 stents .  [facility]            4/04 - Surveillance stress test at [facility]: reportedly negative.  4/05 - Stress thallium study: EF 30%, LAD and RCA defects, elevated lung uptake.- Echo doppler: EF 25%, amt& inf hypokinesis,  apical akinesis, poss  thrombus, mild valvular Regurg       5/06 - Stress thallium: EF 22%, no signficant interval change.  08/2011 - Dobut echo.  EF 10-15%. Antero-apical akinesis/dyskinesis.  No ischemia.  04/2013 RegadenThall EF 23% Extensive Anterior infarct, no ischemia, unchanged from prior     ? Chronic HFrEF (heart failure with reduced ejection fraction) (HCC)       2003 - Status post ICD implant.    4/05 - EF 25-30% per stress thallium and echo doppler.  08/2011 - Echo EF 10-15%     ? Neurologic cardiac syncope        3/05 - Developed syncope while at restaurant, taken to ED.  Symptoms of diaphoresis                    and lightheadedness.          4/05 - Head up tilt study: positive for inducing hypotension 72/49. Produced symptoms                    c/w previous syncopal episode.     ? HTN (hypertension)                 2002 - Diagnosis established.     ? Dyslipidemia         09/26/2008  03/14/2009  02/26/2011  04/17/2012  03/31/2013   LDL 107 (H) 141 (H) 80 108 (H) 104 (H)   04/2013: Refuses Statins understands that they reduce recurrent CV events, still declines.      ? Gout    ? CKD (chronic kidney disease)      mild           Review of Systems   Constitutional: Negative.   HENT: Negative.    Eyes: Negative.    Cardiovascular: Negative.    Respiratory: Negative.    Endocrine: Negative.    Hematologic/Lymphatic: Negative.    Skin: Negative.    Musculoskeletal: Negative.    Gastrointestinal: Negative.    Genitourinary: Negative.    Neurological: Negative.    Psychiatric/Behavioral: Negative.    Allergic/Immunologic: Negative.        Physical Exam    Physical Exam   General Appearance: no distress   Skin: warm, no ulcers or xanthomas   Digits and Nails: no cyanosis or clubbing   Eyes: conjunctivae and lids normal, pupils are equal and round   Teeth/Gums/Palate: dentition unremarkable, no lesions   Lips & Oral Mucosa: no pallor or cyanosis   Neck Veins: normal JVP , neck veins are not distended   Thyroid: no nodules, masses, tenderness or enlargement   Chest Inspection: chest is normal in appearance   Respiratory Effort: breathing comfortably, no respiratory distress   Auscultation/Percussion: lungs clear to auscultation, no rales or rhonchi, no wheezing   PMI: PMI not enlarged or displaced   Cardiac Rhythm: regular rhythm and normal rate   Cardiac Auscultation: S1, S2 normal, no rub, no gallop   Murmurs: no murmur   Peripheral Circulation: normal peripheral circulation   Carotid Arteries: normal carotid upstroke bilaterally, no bruits   Radial Arteries: normal symmetric radial pulses   Abdominal Aorta: no abdominal aortic bruit   Pedal Pulses: normal symmetric pedal pulses   Lower Extremity Edema: no lower extremity edema   Abdominal Exam: soft, non-tender, no masses, bowel sounds normal   Liver & Spleen: no organomegaly   Gait & Station: walks without assistance   Muscle Strength: normal muscle tone   Orientation: oriented to time, place and person   Affect & Mood: appropriate and sustained affect   Language and Memory: patient responsive and seems to  comprehend information   Neurologic Exam: neurological assessment grossly intact   Other: moves all extremities      Cardiovascular Health Factors  Vitals BP Readings from Last 3 Encounters:   02/06/21 118/72   07/03/20 108/64   06/28/20 119/66     Wt Readings from Last 3 Encounters:   02/06/21 92.6 kg (204 lb 3.2 oz)   07/03/20 83.4 kg (183 lb 14.4 oz)   06/28/20 86.2 kg (190 lb)     BMI Readings from Last 3 Encounters:   02/06/21 27.69 kg/m?   07/03/20 24.94 kg/m?   06/28/20 25.77 kg/m?      Smoking Social History     Tobacco Use   Smoking Status Never   Smokeless Tobacco Never      Lipid Profile Cholesterol   Date Value Ref Range Status   03/30/2020 81 <200 MG/DL Final     HDL   Date Value Ref Range Status   03/30/2020 39 (L) >40 MG/DL Final     LDL   Date Value Ref Range Status   03/30/2020 41 <100 mg/dL Final     Triglycerides   Date Value Ref Range Status   03/30/2020 63 <150 MG/DL Final      Blood Sugar Hemoglobin A1C   Date Value Ref Range Status   03/30/2020 6.6 (H) 4.0 - 6.0 % Final     Comment:     The ADA recommends that most patients with type 1 and type 2 diabetes maintain   an A1c level <7%.       Glucose   Date Value Ref Range Status   12/20/2020 79  Final   05/31/2020 89 70 - 100 MG/DL Final   29/52/8413 244 (H) 70 - 100 MG/DL Final     Glucose, POC   Date Value Ref Range Status   06/02/2020 90 70 - 100 MG/DL Final   01/30/7251 664 (H) 70 - 100 MG/DL Final   40/34/7425 956 (H) 70 - 100 MG/DL Final   38/75/6433 295 (H) 70 - 100 MG/DL Final          Problems Addressed Today  Encounter Diagnoses   Name Primary?   ? Chronic HFrEF (heart failure with reduced ejection fraction) (HCC)    ? Paroxysmal atrial fibrillation (HCC)    ? Coronary artery disease involving native coronary artery of native heart without angina pectoris    ? Primary hypertension    ? Recurrent ventricular tachycardia w/ 2 VT ablations 04/2013    ? Automatic implantable cardioverter-defibrillator in situ        Assessment and Plan       Chronic HFrEF (heart failure with reduced ejection fraction) (HCC)  He is minimally symptomatic and appears to be euvolemic on exam today.    Coronary artery disease involving native coronary artery of native heart without angina pectoris  He denies any angina or other symptoms suggesting progression of coronary disease.    HTN (hypertension)  The goal for his blood pressure is an average of 140/80 or less.    Recurrent ventricular tachycardia w/ 2 VT ablations 04/2013  He remains on amiodarone for suppression of ventricular tachycardia.  He is scheduled for a device check on Thursday.    Automatic implantable cardioverter-defibrillator in situ  He is scheduled for a device check on Thursday.  There have been no apparent therapies according to his report.      Current Medications (including today's revisions)  ? acetaminophen (TYLENOL EXTRA STRENGTH) 500  mg tablet Take one tablet by mouth every 6 hours as needed. Max of 4,000 mg of acetaminophen in 24 hours.   ? allopurinoL (ZYLOPRIM) 100 mg tablet TAKE 1 TABLET BY MOUTH EVERY NIGHT AT BEDTIME WITH FOOD (Patient taking differently: 300 mg.)   ? amiodarone (CORDARONE) 200 mg tablet Take one tablet by mouth daily. Take with food.   ? aspirin 81 mg chewable tablet Chew 81 mg by mouth daily. Take with food.   ? bumetanide (BUMEX) 1 mg tablet Take 1 mg of Bumex daily as needed for weight gain > 2 lbs in 24 hours or > 4 lbs in 48 hours or > 5 lbs in one week.  Indications: accumulation of fluid resulting from chronic heart failure, fluid in the lungs due to chronic heart failure   ? CHOLEcalciferoL (vitamin D3) (VITAMIN D-3) 5000 unit tablet Take 5,000 Units by mouth daily.   ? docusate (COLACE) 100 mg capsule Take 100 mg by mouth twice daily.   ? finasteride (PROSCAR) 5 mg tablet Take one tablet by mouth daily.   ? ketoconazole (NIZORAL) 2 % topical cream Apply  topically to affected area twice daily.   ? levothyroxine (SYNTHROID) 50 mcg tablet TAKE 1 TABLET BY MOUTH EVERY DAY 30 MINUTES BEFORE BREAKFAST   ? metoprolol XL (TOPROL XL) 25 mg extended release tablet Take one-half tablet by mouth daily. HOLD IF SBP < 100   ? nystatin (NYSTOP) 100,000 unit/g topical powder Apply  topically to affected area twice daily.   ? ondansetron HCL (ZOFRAN) 4 mg tablet Take 4 mg by mouth every 6 hours as needed for Nausea or Vomiting.   ? polyethylene glycol 3350 (MIRALAX) 17 g packet Take one packet by mouth daily as needed (Constipation - Second Line). (Patient taking differently: Take 17 g by mouth daily.)   ? potassium chloride (KLOR-CON 10) 10 mEq tablet Take 10 mEq by mouth daily.   ? senna/docusate (SENOKOT-S) 8.6/50 mg tablet Take one tablet by mouth daily as needed (Constipation - First Line). (Patient taking differently: Take 1 tablet by mouth as Needed.)   ? spironolactone (ALDACTONE) 25 mg tablet Take one-half tablet by mouth daily. Take with food.   ? tamsulosin (FLOMAX) 0.4 mg capsule Take one capsule by mouth daily after breakfast. Do not crush, chew or open capsules. Take 30 minutes following the same meal each day.   ? thiamine (VITAMIN B-1) 100 mg tablet Take one tablet by mouth daily.     Total time spent on today's office visit was 30 minutes.  This includes face-to-face in person visit with patient as well as nonface-to-face time including review of the EMR, outside records, labs, radiologic studies, echocardiogram & other cardiovascular studies, formation of treatment plan, after visit summary, future disposition, and lastly on documentation.

## 2021-02-06 NOTE — Assessment & Plan Note
He is scheduled for a device check on Thursday.  There have been no apparent therapies according to his report.

## 2021-02-06 NOTE — Assessment & Plan Note
He is minimally symptomatic and appears to be euvolemic on exam today.

## 2021-02-06 NOTE — Assessment & Plan Note
He denies any angina or other symptoms suggesting progression of coronary disease.

## 2021-02-06 NOTE — Assessment & Plan Note
The goal for his blood pressure is an average of 140/80 or less.

## 2021-02-06 NOTE — Assessment & Plan Note
He remains on amiodarone for suppression of ventricular tachycardia.  He is scheduled for a device check on Thursday.

## 2021-02-08 ENCOUNTER — Encounter: Admit: 2021-02-08 | Discharge: 2021-02-08 | Payer: MEDICARE

## 2021-02-21 ENCOUNTER — Encounter: Admit: 2021-02-21 | Discharge: 2021-02-21 | Payer: MEDICARE

## 2021-02-21 MED ORDER — ALLOPURINOL 100 MG PO TAB
ORAL_TABLET | Freq: Every evening | ORAL | 3 refills | 45.00000 days | Status: AC
Start: 2021-02-21 — End: ?

## 2021-04-02 IMAGING — CR CHEST
2 series · 2 of 2 positions shown · non-contrast
Comparison: none

[chest pa x-wise]
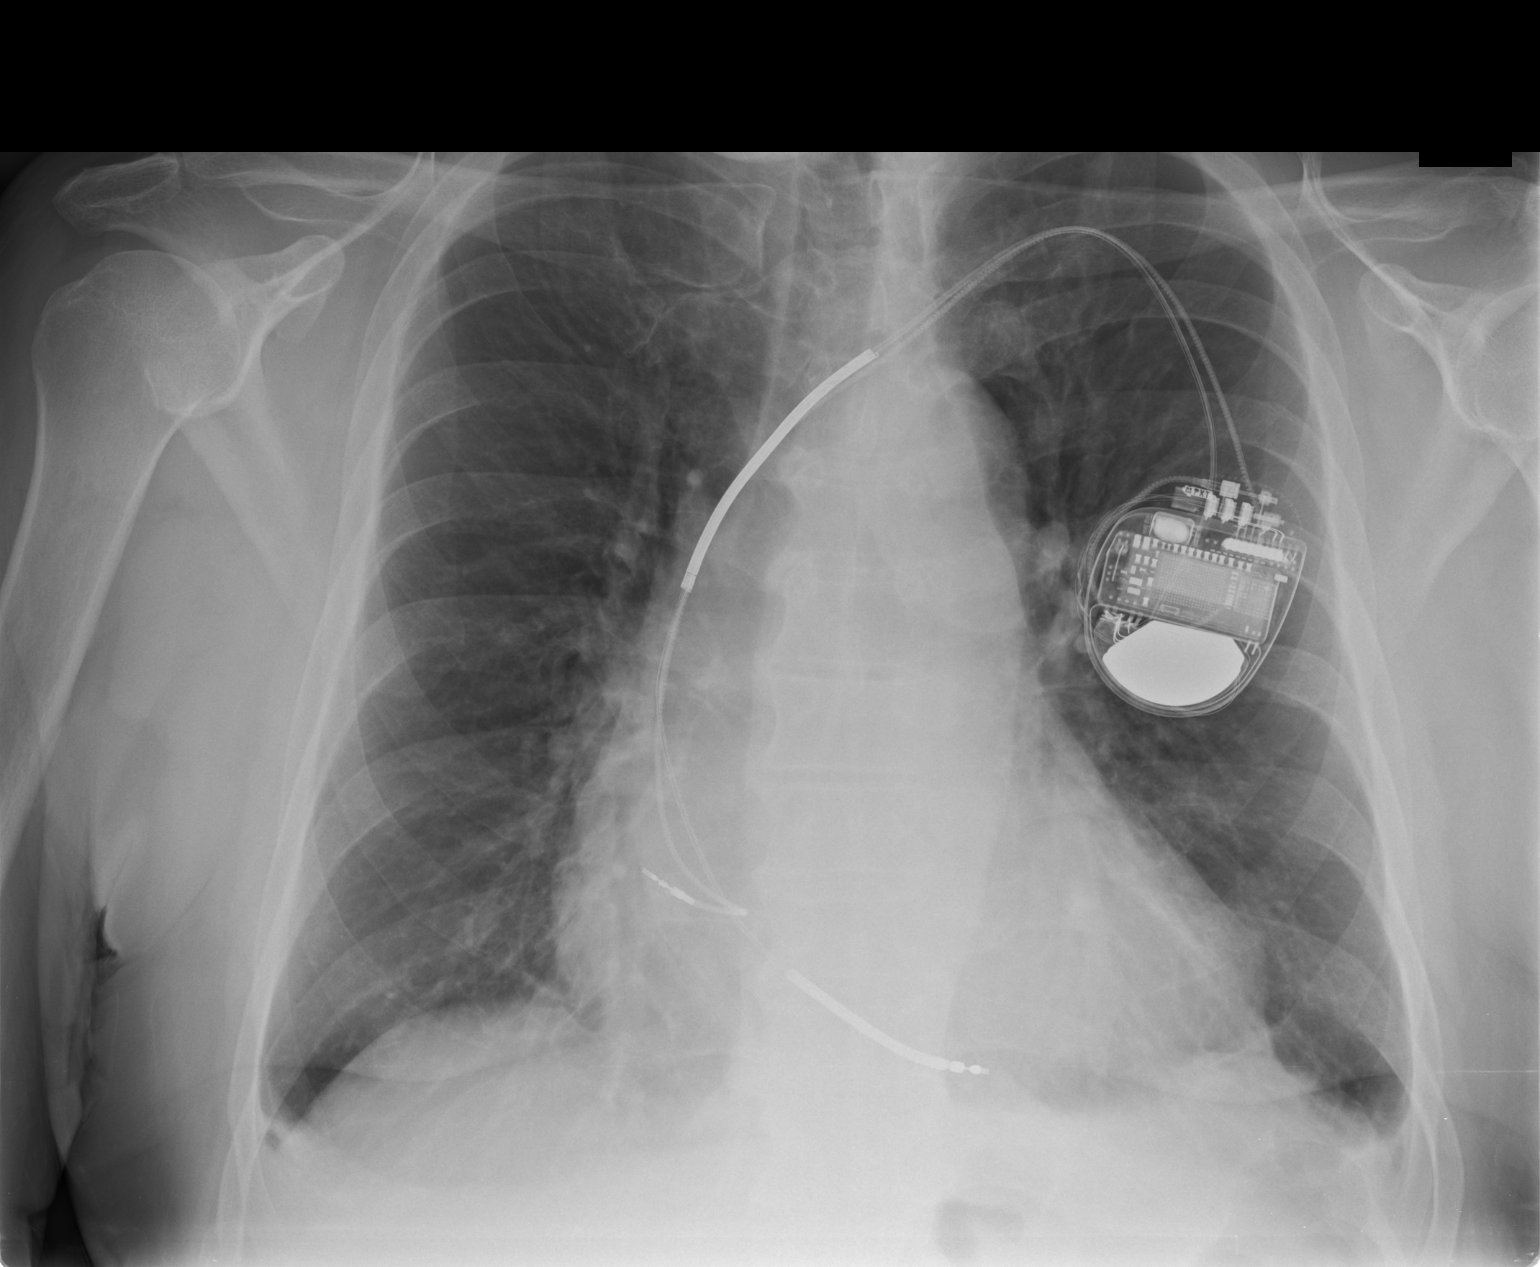

[chest lat]
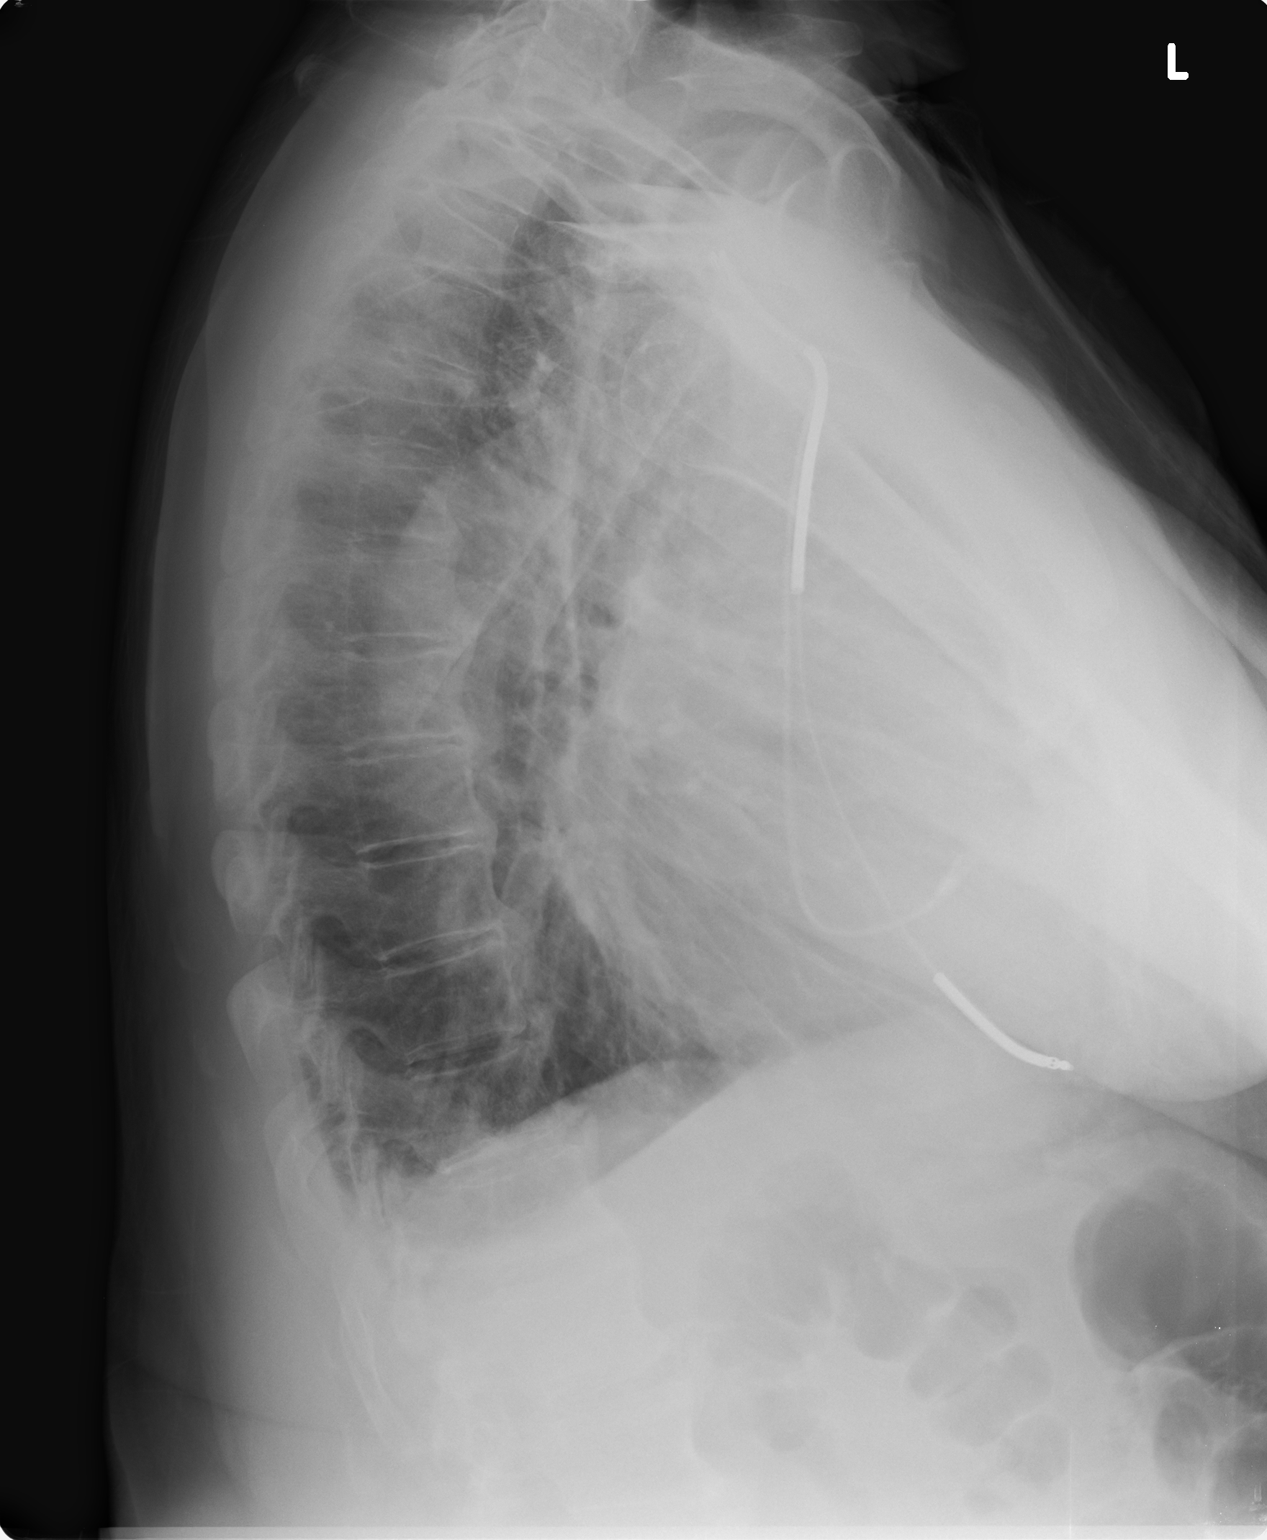

[2 of 2 positions shown; findings below may reference images not displayed]

DIAGNOSTIC STUDIES

EXAM
RADIOLOGICAL EXAMINATION, CHEST; 2 VIEWS FRONTAL AND LATERAL CPT 79080

INDICATION
Paroxysmal atrial fibrillation
ATRIAL FIB, HX DEFIB/PACEMAKER

TECHNIQUE
PA and lateral view chest

COMPARISONS
No prior studies are available for comparison.

FINDINGS
There is hyperinflation and chronic interstitial change with bibasilar atelectasis versus scar.
There are small bibasilar effusions. The cardiac silhouette is enlarged with a dual-chamber ICD. The
bony thorax is grossly intact. There is no pneumothorax.

IMPRESSION
Hyperinflation and chronic interstitial changes with bibasilar atelectasis versus scar with likely
trace basilar effusions.

Tech Notes:

ATRIAL FIB, HX DEFIB/PACEMAKER

## 2021-05-08 ENCOUNTER — Encounter: Admit: 2021-05-08 | Discharge: 2021-05-08 | Payer: MEDICARE

## 2021-05-08 DIAGNOSIS — Z95810 Presence of automatic (implantable) cardiac defibrillator: Secondary | ICD-10-CM

## 2021-06-13 ENCOUNTER — Encounter: Admit: 2021-06-13 | Discharge: 2021-06-13 | Payer: MEDICARE

## 2021-06-13 DIAGNOSIS — I480 Paroxysmal atrial fibrillation: Secondary | ICD-10-CM

## 2021-06-13 DIAGNOSIS — I255 Ischemic cardiomyopathy: Secondary | ICD-10-CM

## 2021-06-13 DIAGNOSIS — Z79899 Other long term (current) drug therapy: Secondary | ICD-10-CM

## 2021-06-13 NOTE — Progress Notes
Amiodarone Monitoring 06/13/21:     [facility] Protocol:   Due at baseline and every 6 months:  Labs: ALT, AST, TSH.     Due at baseline and every 12 months: K, Mg, Office Visit, ECG(can use V rate for HR), Chest Imaging (CXR, PFT or high resolution CT), BP and an eye exam.     Amiodarone status:  Amiodarone testing needed: CMP, Magnesium, TSH with Free T4 Reflex and CXR and follow-up in 180 days.    Most recent test results (every 6 months: AST, ALT, TSH w Free T4 within normal limits. Every 12 months: Potassium, Magnesium)  Lab Results   Component Value Date/Time    AST 13 12/20/2020 12:00 AM    ALT 9 12/20/2020 12:00 AM    TSH 7.82 (H) 03/29/2020 10:40 PM    FREET4R 1.5 03/29/2020 10:40 PM    K 4.7 12/20/2020 12:00 AM    MG 2.1 05/31/2020 06:36 AM       BP:   BP Readings from Last 1 Encounters:   02/06/21 118/72       ECG:      CXR, PFT or high resolution CT: need    Last Cardiology OV -  02/06/21      Provider notified of abnormal results? Not Applicable    These criteria follow national best practices as defined by [facility] Protocol.

## 2021-06-18 ENCOUNTER — Emergency Department: Admit: 2021-06-18 | Discharge: 2021-06-18 | Payer: MEDICARE

## 2021-06-18 ENCOUNTER — Encounter: Admit: 2021-06-18 | Discharge: 2021-06-18 | Payer: MEDICARE

## 2021-06-18 ENCOUNTER — Inpatient Hospital Stay: Admit: 2021-06-18 | Payer: MEDICARE

## 2021-06-18 DIAGNOSIS — L239 Allergic contact dermatitis, unspecified cause: Secondary | ICD-10-CM

## 2021-06-18 DIAGNOSIS — R55 Syncope and collapse: Secondary | ICD-10-CM

## 2021-06-18 DIAGNOSIS — I10 Essential (primary) hypertension: Secondary | ICD-10-CM

## 2021-06-18 DIAGNOSIS — Z8679 Personal history of other diseases of the circulatory system: Secondary | ICD-10-CM

## 2021-06-18 DIAGNOSIS — I2510 Atherosclerotic heart disease of native coronary artery without angina pectoris: Secondary | ICD-10-CM

## 2021-06-18 DIAGNOSIS — N179 Acute kidney failure, unspecified: Secondary | ICD-10-CM

## 2021-06-18 DIAGNOSIS — I509 Heart failure, unspecified: Secondary | ICD-10-CM

## 2021-06-18 DIAGNOSIS — Z955 Presence of coronary angioplasty implant and graft: Secondary | ICD-10-CM

## 2021-06-18 DIAGNOSIS — E8770 Fluid overload, unspecified: Secondary | ICD-10-CM

## 2021-06-18 DIAGNOSIS — Z95810 Presence of automatic (implantable) cardiac defibrillator: Secondary | ICD-10-CM

## 2021-06-18 DIAGNOSIS — E785 Hyperlipidemia, unspecified: Secondary | ICD-10-CM

## 2021-06-18 DIAGNOSIS — R051 Acute cough: Secondary | ICD-10-CM

## 2021-06-18 DIAGNOSIS — M1990 Unspecified osteoarthritis, unspecified site: Secondary | ICD-10-CM

## 2021-06-18 DIAGNOSIS — I255 Ischemic cardiomyopathy: Secondary | ICD-10-CM

## 2021-06-18 DIAGNOSIS — M545 Acute midline low back pain without sciatica: Secondary | ICD-10-CM

## 2021-06-18 DIAGNOSIS — N289 Disorder of kidney and ureter, unspecified: Secondary | ICD-10-CM

## 2021-06-18 DIAGNOSIS — T148XXA Other injury of unspecified body region, initial encounter: Secondary | ICD-10-CM

## 2021-06-18 LAB — CBC AND DIFF
ABSOLUTE BASO COUNT: 0 K/UL (ref 0–0.20)
ABSOLUTE EOS COUNT: 0.5 K/UL — ABNORMAL HIGH (ref 0–0.45)
ABSOLUTE MONO COUNT: 0.8 K/UL — ABNORMAL HIGH (ref 0–0.80)
WBC COUNT: 8.5 K/UL (ref 4.5–11.0)

## 2021-06-18 LAB — HIGH SENSITIVITY TROPONIN I 4 HR
HI SEN TNI 4 HR: 24 ng/L — ABNORMAL HIGH (ref <20)
HI SEN TNI DELTA 4-2: 5

## 2021-06-18 LAB — COMPREHENSIVE METABOLIC PANEL
EGFR: 32 mL/min — ABNORMAL LOW (ref >60)
SODIUM: 137 MMOL/L (ref 137–147)

## 2021-06-18 LAB — URINALYSIS DIPSTICK REFLEX TO CULTURE
NITRITE: NEGATIVE MMOL/L — ABNORMAL HIGH (ref 21–30)
URINE ASCORBIC ACID, UA: NEGATIVE K/UL — ABNORMAL HIGH (ref 3–12)
URINE BILE: NEGATIVE g/dL (ref 3.5–5.0)
URINE BLOOD: NEGATIVE U/L — ABNORMAL LOW (ref 25–110)
URINE KETONE: NEGATIVE mg/dL (ref 0.3–1.2)

## 2021-06-18 LAB — PROTIME INR (PT): PROTIME: 11 s (ref 9.5–14.2)

## 2021-06-18 LAB — URINALYSIS MICROSCOPIC REFLEX TO CULTURE

## 2021-06-18 LAB — COVID-19 (SARS-COV-2) PCR

## 2021-06-18 LAB — HIGH SENSITIVITY TROPONIN I 0 HOUR: HIGH SENSITIVITY TROPONIN I 0 HOUR: 20 ng/L — ABNORMAL HIGH (ref <20)

## 2021-06-18 LAB — HIGH SENSITIVITY TROPONIN I 2 HOUR: HIGH SENSITIVITY TROPONIN I 2 HOUR: 19 ng/L (ref <20)

## 2021-06-18 LAB — BNP (B-TYPE NATRIURETIC PEPTI): BNP: 220 pg/mL — ABNORMAL HIGH (ref 0–100)

## 2021-06-18 MED ORDER — TAMSULOSIN 0.4 MG PO CAP
.4 mg | Freq: Every day | ORAL | 0 refills | Status: AC
Start: 2021-06-18 — End: ?
  Administered 2021-06-19 – 2021-06-23 (×5): 0.4 mg via ORAL

## 2021-06-18 MED ORDER — POLYETHYLENE GLYCOL 3350 17 GRAM PO PWPK
1 | Freq: Every day | ORAL | 0 refills | PRN
Start: 2021-06-18 — End: ?

## 2021-06-18 MED ORDER — ONDANSETRON HCL (PF) 4 MG/2 ML IJ SOLN
4 mg | INTRAVENOUS | 0 refills | PRN
Start: 2021-06-18 — End: ?

## 2021-06-18 MED ORDER — HEPARIN, PORCINE (PF) 5,000 UNIT/0.5 ML IJ SYRG
5000 [IU] | SUBCUTANEOUS | 0 refills
Start: 2021-06-18 — End: ?

## 2021-06-18 MED ORDER — MELATONIN 5 MG PO TAB
5 mg | Freq: Every evening | ORAL | 0 refills | PRN
Start: 2021-06-18 — End: ?
  Administered 2021-06-22: 01:00:00 5 mg via ORAL

## 2021-06-18 MED ORDER — POLYETHYLENE GLYCOL 3350 17 GRAM PO PWPK
17 g | Freq: Every day | ORAL | 0 refills | Status: AC | PRN
Start: 2021-06-18 — End: ?

## 2021-06-18 MED ORDER — ASPIRIN 81 MG PO CHEW
81 mg | Freq: Every day | ORAL | 0 refills | Status: AC
Start: 2021-06-18 — End: ?
  Administered 2021-06-19 – 2021-06-23 (×5): 81 mg via ORAL

## 2021-06-18 MED ORDER — FINASTERIDE 5 MG PO TAB
5 mg | Freq: Every day | ORAL | 0 refills | Status: AC
Start: 2021-06-18 — End: ?
  Administered 2021-06-19 – 2021-06-23 (×5): 5 mg via ORAL

## 2021-06-18 MED ORDER — METOPROLOL SUCCINATE 25 MG PO TB24
12.5 mg | Freq: Every day | ORAL | 0 refills | Status: AC
Start: 2021-06-18 — End: ?
  Administered 2021-06-19 – 2021-06-23 (×5): 12.5 mg via ORAL

## 2021-06-18 MED ORDER — BUMETANIDE 0.25 MG/ML IJ SOLN
1 mg | Freq: Once | INTRAVENOUS | 0 refills | Status: CP
Start: 2021-06-18 — End: ?
  Administered 2021-06-18: 21:00:00 1 mg via INTRAVENOUS

## 2021-06-18 MED ORDER — DOCUSATE SODIUM 100 MG PO CAP
100 mg | Freq: Two times a day (BID) | ORAL | 0 refills | Status: AC
Start: 2021-06-18 — End: ?
  Administered 2021-06-19 – 2021-06-23 (×9): 100 mg via ORAL

## 2021-06-18 MED ORDER — ONDANSETRON 4 MG PO TBDI
4 mg | ORAL | 0 refills | PRN
Start: 2021-06-18 — End: ?

## 2021-06-18 MED ORDER — SPIRONOLACTONE 25 MG PO TAB
25 mg | Freq: Every day | ORAL | 0 refills | Status: AC
Start: 2021-06-18 — End: ?
  Administered 2021-06-19 – 2021-06-23 (×5): 25 mg via ORAL

## 2021-06-18 MED ORDER — SENNOSIDES-DOCUSATE SODIUM 8.6-50 MG PO TAB
1 | Freq: Every day | ORAL | 0 refills | PRN
Start: 2021-06-18 — End: ?

## 2021-06-18 MED ORDER — LEVOTHYROXINE 50 MCG PO TAB
50 ug | Freq: Every day | ORAL | 0 refills | Status: AC
Start: 2021-06-18 — End: ?
  Administered 2021-06-19 – 2021-06-23 (×5): 50 ug via ORAL

## 2021-06-18 MED ORDER — AMIODARONE 200 MG PO TAB
200 mg | Freq: Every day | ORAL | 0 refills | Status: AC
Start: 2021-06-18 — End: ?
  Administered 2021-06-19 – 2021-06-23 (×5): 200 mg via ORAL

## 2021-06-18 MED ORDER — ACETAMINOPHEN 500 MG PO TAB
500 mg | ORAL | 0 refills | PRN
Start: 2021-06-18 — End: ?

## 2021-06-18 MED ORDER — THIAMINE MONONITRATE (VIT B1) 100 MG PO TAB
100 mg | Freq: Every day | ORAL | 0 refills | Status: AC
Start: 2021-06-18 — End: ?
  Administered 2021-06-19 – 2021-06-23 (×5): 100 mg via ORAL

## 2021-06-18 NOTE — ED Notes
Pt arrived to the ED A&Ox4 with a CC of back pain and rash which started x 1 months ago. Pt family reports pt is at [facility] and has had ongoing lower back pain and full body rash with itching x 2 months. Pt family denies recent medication change. Pt reports back pain has worsened since bending over to pick up coins on the ground on May 4th. Family also reports pt has had non productive cough x "couple weeks" and would like to be seen for that. Pt recent put on fluid restriction diet due to lower leg edema. Pt denies urinary complication/changes, fevers/chills, n/v, abd pain.     Medical History:   Diagnosis Date    Arthritis     CAD (coronary artery disease)     Dyslipidemia     Fracture     RT ANKLE    History of ventricular tachycardia     HTN (hypertension)     ICD (implantable cardiac defibrillator) in place     Ischemic cardiomyopathy     Neurologic cardiac syncope     Renal insufficiency     mild    S/P coronary artery stent placement      Pt resting in bed, call light within reach, bed locked and in the lowest position.

## 2021-06-19 ENCOUNTER — Inpatient Hospital Stay: Admit: 2021-06-19 | Discharge: 2021-06-19 | Payer: MEDICARE

## 2021-06-19 ENCOUNTER — Encounter: Admit: 2021-06-19 | Discharge: 2021-06-19 | Payer: MEDICARE

## 2021-06-19 DIAGNOSIS — I2510 Atherosclerotic heart disease of native coronary artery without angina pectoris: Secondary | ICD-10-CM

## 2021-06-19 DIAGNOSIS — E785 Hyperlipidemia, unspecified: Secondary | ICD-10-CM

## 2021-06-19 DIAGNOSIS — M1990 Unspecified osteoarthritis, unspecified site: Secondary | ICD-10-CM

## 2021-06-19 DIAGNOSIS — N289 Disorder of kidney and ureter, unspecified: Secondary | ICD-10-CM

## 2021-06-19 DIAGNOSIS — Z95810 Presence of automatic (implantable) cardiac defibrillator: Secondary | ICD-10-CM

## 2021-06-19 DIAGNOSIS — T148XXA Other injury of unspecified body region, initial encounter: Secondary | ICD-10-CM

## 2021-06-19 DIAGNOSIS — R55 Syncope and collapse: Secondary | ICD-10-CM

## 2021-06-19 DIAGNOSIS — I255 Ischemic cardiomyopathy: Secondary | ICD-10-CM

## 2021-06-19 DIAGNOSIS — I10 Essential (primary) hypertension: Secondary | ICD-10-CM

## 2021-06-19 DIAGNOSIS — Z955 Presence of coronary angioplasty implant and graft: Secondary | ICD-10-CM

## 2021-06-19 DIAGNOSIS — Z8679 Personal history of other diseases of the circulatory system: Secondary | ICD-10-CM

## 2021-06-19 MED ADMIN — PERFLUTREN LIPID MICROSPHERES 1.1 MG/ML IV SUSP [79178]: 2 mL | INTRAVENOUS | @ 16:00:00 | Stop: 2021-06-19 | NDC 11994001116

## 2021-06-19 MED ADMIN — ALLOPURINOL 300 MG PO TAB [311]: 300 mg | ORAL | @ 08:00:00 | NDC 00904657261

## 2021-06-19 MED ADMIN — BUMETANIDE 0.25 MG/ML IJ SOLN [9308]: 2 mg | INTRAVENOUS | @ 16:00:00 | Stop: 2021-06-19 | NDC 72205010101

## 2021-06-19 MED ADMIN — HEPARIN, PORCINE (PF) 5,000 UNIT/0.5 ML IJ SYRG [95535]: 5000 [IU] | SUBCUTANEOUS | @ 20:00:00 | NDC 00409131611

## 2021-06-19 MED ADMIN — HEPARIN, PORCINE (PF) 5,000 UNIT/0.5 ML IJ SYRG [95535]: 5000 [IU] | SUBCUTANEOUS | @ 11:00:00 | NDC 00409131611

## 2021-06-19 MED ADMIN — BUMETANIDE 0.25 MG/ML IJ SOLN [9308]: 1 mg | INTRAVENOUS | NDC 72205010101

## 2021-06-20 ENCOUNTER — Inpatient Hospital Stay: Admit: 2021-06-20 | Discharge: 2021-06-20 | Payer: MEDICARE

## 2021-06-20 ENCOUNTER — Encounter: Admit: 2021-06-20 | Discharge: 2021-06-20 | Payer: MEDICARE

## 2021-06-20 DIAGNOSIS — Z95810 Presence of automatic (implantable) cardiac defibrillator: Secondary | ICD-10-CM

## 2021-06-20 MED ADMIN — BUMETANIDE 0.25 MG/ML IJ SOLN [9308]: 1 mg | INTRAVENOUS | @ 14:00:00 | NDC 72205010101

## 2021-06-20 MED ADMIN — HYDROXYZINE HCL 25 MG PO TAB [3774]: 25 mg | ORAL | @ 02:00:00 | NDC 00904661761

## 2021-06-20 MED ADMIN — ALLOPURINOL 300 MG PO TAB [311]: 300 mg | ORAL | @ 02:00:00 | NDC 00904657261

## 2021-06-20 MED ADMIN — HEPARIN, PORCINE (PF) 5,000 UNIT/0.5 ML IJ SYRG [95535]: 5000 [IU] | SUBCUTANEOUS | @ 11:00:00 | NDC 00409131611

## 2021-06-20 MED ADMIN — HEPARIN, PORCINE (PF) 5,000 UNIT/0.5 ML IJ SYRG [95535]: 5000 [IU] | SUBCUTANEOUS | @ 19:00:00 | NDC 00409131611

## 2021-06-20 MED ADMIN — BUMETANIDE 0.25 MG/ML IJ SOLN [9308]: 1 mg | INTRAVENOUS | @ 22:00:00 | NDC 72205010101

## 2021-06-20 MED ADMIN — HEPARIN, PORCINE (PF) 5,000 UNIT/0.5 ML IJ SYRG [95535]: 5000 [IU] | SUBCUTANEOUS | @ 02:00:00 | NDC 00409131611

## 2021-06-21 MED ADMIN — BUMETANIDE 0.25 MG/ML IJ SOLN [9308]: 1 mg | INTRAVENOUS | @ 14:00:00 | NDC 72205010101

## 2021-06-21 MED ADMIN — BENZONATATE 100 MG PO CAP [988]: 100 mg | ORAL | @ 14:00:00 | NDC 00904715361

## 2021-06-21 MED ADMIN — ALLOPURINOL 300 MG PO TAB [311]: 300 mg | ORAL | @ 03:00:00 | NDC 00904657261

## 2021-06-21 MED ADMIN — HYDROXYZINE HCL 25 MG PO TAB [3774]: 25 mg | ORAL | @ 11:00:00 | NDC 00904661761

## 2021-06-21 MED ADMIN — BUMETANIDE 0.25 MG/ML IJ SOLN [9308]: 1 mg | INTRAVENOUS | @ 22:00:00 | NDC 72205010101

## 2021-06-21 MED ADMIN — TRIAMCINOLONE ACETONIDE 0.1 % TP OINT [8118]: TOPICAL | @ 14:00:00 | NDC 51672128401

## 2021-06-21 MED ADMIN — HEPARIN, PORCINE (PF) 5,000 UNIT/0.5 ML IJ SYRG [95535]: 5000 [IU] | SUBCUTANEOUS | @ 20:00:00 | NDC 00409131611

## 2021-06-21 MED ADMIN — HYDROCORTISONE 2.5 % TP CREA [3727]: TOPICAL | @ 14:00:00 | NDC 00168008031

## 2021-06-21 MED ADMIN — HEPARIN, PORCINE (PF) 5,000 UNIT/0.5 ML IJ SYRG [95535]: 5000 [IU] | SUBCUTANEOUS | @ 11:00:00 | NDC 00409131611

## 2021-06-21 MED ADMIN — HEPARIN, PORCINE (PF) 5,000 UNIT/0.5 ML IJ SYRG [95535]: 5000 [IU] | SUBCUTANEOUS | @ 03:00:00 | NDC 00409131611

## 2021-06-22 MED ADMIN — HYDROXYZINE HCL 25 MG PO TAB [3774]: 25 mg | ORAL | @ 01:00:00 | NDC 00904661761

## 2021-06-22 MED ADMIN — HEPARIN, PORCINE (PF) 5,000 UNIT/0.5 ML IJ SYRG [95535]: 5000 [IU] | SUBCUTANEOUS | @ 04:00:00 | NDC 00409131611

## 2021-06-22 MED ADMIN — HEPARIN, PORCINE (PF) 5,000 UNIT/0.5 ML IJ SYRG [95535]: 5000 [IU] | SUBCUTANEOUS | @ 11:00:00 | NDC 00409131611

## 2021-06-22 MED ADMIN — ALLOPURINOL 300 MG PO TAB [311]: 300 mg | ORAL | @ 01:00:00 | NDC 00904657261

## 2021-06-22 MED ADMIN — BENZONATATE 100 MG PO CAP [988]: 100 mg | ORAL | @ 01:00:00 | NDC 00904715361

## 2021-06-22 MED ADMIN — HEPARIN, PORCINE (PF) 5,000 UNIT/0.5 ML IJ SYRG [95535]: 5000 [IU] | SUBCUTANEOUS | @ 19:00:00 | NDC 00409131611

## 2021-06-23 MED ADMIN — HYDROCORTISONE 2.5 % TP CREA [3727]: TOPICAL | @ 14:00:00 | Stop: 2021-06-24 | NDC 00168008031

## 2021-06-23 MED ADMIN — HEPARIN, PORCINE (PF) 5,000 UNIT/0.5 ML IJ SYRG [95535]: 5000 [IU] | SUBCUTANEOUS | @ 02:00:00 | NDC 00409131611

## 2021-06-23 MED ADMIN — BUMETANIDE 0.5 MG PO TAB [9309]: 1 mg | ORAL | @ 19:00:00 | Stop: 2021-06-24 | NDC 69238148901

## 2021-06-23 MED ADMIN — HEPARIN, PORCINE (PF) 5,000 UNIT/0.5 ML IJ SYRG [95535]: 5000 [IU] | SUBCUTANEOUS | @ 19:00:00 | Stop: 2021-06-24 | NDC 00409131611

## 2021-06-23 MED ADMIN — TRIAMCINOLONE ACETONIDE 0.1 % TP OINT [8118]: TOPICAL | @ 14:00:00 | Stop: 2021-06-24 | NDC 51672128401

## 2021-06-23 MED ADMIN — HEPARIN, PORCINE (PF) 5,000 UNIT/0.5 ML IJ SYRG [95535]: 5000 [IU] | SUBCUTANEOUS | @ 11:00:00 | Stop: 2021-06-24 | NDC 00409131611

## 2021-06-23 MED ADMIN — ALLOPURINOL 300 MG PO TAB [311]: 300 mg | ORAL | @ 02:00:00 | NDC 00904657261

## 2021-06-26 ENCOUNTER — Encounter: Admit: 2021-06-26 | Discharge: 2021-06-26 | Payer: MEDICARE

## 2021-06-26 NOTE — Telephone Encounter
Hospital Discharge Follow Up      Reached Patient:No, transferred to Skilled Nursing Facility, [facility]  Patient Date of Birth: 23-Jul-1935     Admission Information:     Hospital Name: [facility]  Admission Date: 06/18/2021  Discharge Date: 06/23/2021    Admission Diagnosis: Fluid overload  Discharge Diagnosis: (Principal) Fluid overload    Acute on chronic combined systolic and diastolic ACC/AHA stage C   congestive heart failure (HCC)   Has there been a discharge within the last 30 days? No  If yes, reason: N/A  Hospital Services: Unplanned  Today's call is 1(business) days post discharge    Medication Reconciliation    Changes to pre-hospital medications? Yes   CHANGE how you take:  bumetanide (BUMEX)  STOP taking:  potassium chloride 10 mEq tablet (KLOR-CON 10)  Were new prescriptions filled?N/A   START taking:  hydrocortisone (HYTONE)  triamcinolone acetonide (KENALOG)  Meds reviewed and reconciled?Yes  ? acetaminophen (TYLENOL EXTRA STRENGTH) 500 mg tablet Take one tablet by mouth every 6 hours as needed. Max of 4,000 mg of acetaminophen in 24 hours.   ? allopurinoL (ZYLOPRIM) 300 mg tablet Take one tablet by mouth at bedtime daily.   ? amiodarone (CORDARONE) 200 mg tablet Take one tablet by mouth daily. Take with food.   ? aspirin 81 mg chewable tablet Chew one tablet by mouth daily. Take with food.   ? bumetanide (BUMEX) 1 mg tablet Take one tablet by mouth daily.   ? calcium carbonate (TUMS E-X) 300 mg (750 mg) chewable tablet Chew two tablets by mouth at bedtime daily.   ? CHOLEcalciferoL (vitamin D3) (VITAMIN D-3) 5000 unit tablet Take one tablet by mouth daily.   ? docusate (COLACE) 100 mg capsule Take one capsule by mouth twice daily.   ? finasteride (PROSCAR) 5 mg tablet Take one tablet by mouth daily.   ? hydrocortisone (HYTONE) 2.5 % topical cream Apply  topically to affected area twice daily. Apply to areas of rash on face/groin/underarms   ? ketoconazole (NIZORAL) 2 % topical cream Apply  topically to affected area twice daily.   ? levothyroxine (SYNTHROID) 50 mcg tablet TAKE 1 TABLET BY MOUTH EVERY DAY 30 MINUTES BEFORE BREAKFAST   ? metoprolol XL (TOPROL XL) 25 mg extended release tablet Take one-half tablet by mouth daily. HOLD IF SBP < 100   ? nystatin (NYSTOP) 100,000 unit/g topical powder Apply  topically to affected area twice daily.   ? ondansetron HCL (ZOFRAN) 4 mg tablet Take one tablet by mouth every 6 hours as needed for Nausea or Vomiting.   ? polyethylene glycol 3350 (MIRALAX) 17 g packet Take one packet by mouth daily as needed (Constipation - Second Line).   ? senna/docusate (SENOKOT-S) 8.6/50 mg tablet Take one tablet by mouth daily as needed (Constipation - First Line).   ? spironolactone (ALDACTONE) 25 mg tablet Take one tablet by mouth daily. Take with food.   ? tamsulosin (FLOMAX) 0.4 mg capsule Take one capsule by mouth daily after breakfast. Do not crush, chew or open capsules. Take 30 minutes following the same meal each day.   ? thiamine (VITAMIN B-1) 100 mg tablet Take one tablet by mouth daily.   ? triamcinolone acetonide (KENALOG) 0.1 % topical ointment Apply  topically to affected area twice daily. Apply to areas of rash on body, do NOT use on face/groin/underarms     Scheduling Follow-up Appointment   Upcoming appointment date and  time and with whom scheduled:   Future Appointments   Date Time Provider Department Center   06/27/2021  1:00 PM Sue Lush, APRN-NP [facility] Exam     PCP appointment scheduled?No, none  PCP primary location: [facility]  Specialist appointment scheduled? Yes, with Cardiology 06/27/2021  Both PCP and Specialist appointment scheduled: No    MyChart message sent? Inactive in MyChart.    Donnelly Stager, RN

## 2021-06-27 ENCOUNTER — Ambulatory Visit: Admit: 2021-06-27 | Discharge: 2021-06-27 | Payer: MEDICARE

## 2021-06-27 ENCOUNTER — Encounter: Admit: 2021-06-27 | Discharge: 2021-06-27 | Payer: MEDICARE

## 2021-06-27 DIAGNOSIS — E785 Hyperlipidemia, unspecified: Secondary | ICD-10-CM

## 2021-06-27 DIAGNOSIS — I10 Essential (primary) hypertension: Secondary | ICD-10-CM

## 2021-06-27 DIAGNOSIS — R55 Syncope and collapse: Secondary | ICD-10-CM

## 2021-06-27 DIAGNOSIS — I5022 Chronic systolic (congestive) heart failure: Secondary | ICD-10-CM

## 2021-06-27 DIAGNOSIS — I472 Recurrent ventricular tachycardia (HCC): Secondary | ICD-10-CM

## 2021-06-27 DIAGNOSIS — M1990 Unspecified osteoarthritis, unspecified site: Secondary | ICD-10-CM

## 2021-06-27 DIAGNOSIS — I2510 Atherosclerotic heart disease of native coronary artery without angina pectoris: Secondary | ICD-10-CM

## 2021-06-27 DIAGNOSIS — I255 Ischemic cardiomyopathy: Secondary | ICD-10-CM

## 2021-06-27 DIAGNOSIS — N289 Disorder of kidney and ureter, unspecified: Secondary | ICD-10-CM

## 2021-06-27 DIAGNOSIS — Z95810 Presence of automatic (implantable) cardiac defibrillator: Secondary | ICD-10-CM

## 2021-06-27 DIAGNOSIS — Z8679 Personal history of other diseases of the circulatory system: Secondary | ICD-10-CM

## 2021-06-27 DIAGNOSIS — N1832 Stage 3b chronic kidney disease (HCC): Secondary | ICD-10-CM

## 2021-06-27 DIAGNOSIS — T148XXA Other injury of unspecified body region, initial encounter: Secondary | ICD-10-CM

## 2021-06-27 DIAGNOSIS — Z955 Presence of coronary angioplasty implant and graft: Secondary | ICD-10-CM

## 2021-06-27 DIAGNOSIS — I480 Paroxysmal atrial fibrillation: Secondary | ICD-10-CM

## 2021-06-27 MED ORDER — BUMETANIDE 1 MG PO TAB
.5 mg | ORAL_TABLET | Freq: Every day | ORAL | 1 refills | Status: AC
Start: 2021-06-27 — End: ?

## 2021-06-27 NOTE — Patient Instructions
Med changes today:  Decrease bumex to 0.5 mg once daily   Please regulate fluid intake making sure patient is drinking 1.5 L/day but no more than 2L in a day. (One cup of fluid every two hours while awake) Water is better than juice. No soda. (One or two cups of tea only, mostly water)   Please log your weight, blood pressure, and heart rate and bring to all visits. Call for weight up 3 pounds overnight or 5 pounds in a week.   Labs in 1 week: BMP  Return to see Dr. Barry Dienes next available and  Fara Boros, NP in a month    Please call the office with any questions or concerns [phone] (nurse triage).  To schedule or change an appointment call [phone].  Fax: [phone]      Sue Lush, CHFN, APRN-NP  Nurse Practitioner    Advanced Heart Failure - Silver Team  Kathleen Argue, MD   Bing Matter, MD  Nurse Practitioners: Satira Anis, Fara Boros, Eyvonne Left and Gates Rigg  RNs: Mick Sell, Juanetta Gosling, Trina Ao, [facility] at [facility]          Your Heart Failure Symptom Awareness and Action Plan  Every Day Action Plan  Weigh yourself in the morning before breakfast. Write it down and compare it to yesterday's weight.  Take your medicine, as prescribed. Please call if you have concerns about the side effects, cost or refills.  Check for worsened swelling in your feet, ankles and stomach  Follow a 2 gm (2000 mg) salt diet.  Keep all healthcare appointments    Green Zone   Good! Symptoms are under control No shortness of breath  No increase in ankle swelling  No weight gain  No chest pain  No change in your usual activity  Continue to follow your everyday action plan.    Yellow Zone  If you have any of these symptoms, please call the heart failure nurses: [phone] Increased shortness of breath with activity  Weight gain of 3 pounds in one day or 5 pounds in a week  Increased swelling in your ankles or legs  Increased swelling in your stomach  Increasing fatigue  You may need an adjustment of your medications.    Red Zone  These are urgent symptoms. Please call the heart failure nurses: [phone] Shortness of breath at rest or waking up at night feeling short of breath or coughing  Increased number of pillows used or needing to sit upright to sleep  Chest tightness at rest  Dizziness, lightheadedness or feeling faint You need to schedule an appointment   Emergency Zone - call 911 Worsening chest tightness or pain that is not relieved by medication  Severe shortness of breath and a cough with pink, frothy sputum

## 2021-06-27 NOTE — Progress Notes
Date of Service: 06/27/2021    Logan Bright is a 86 y.o. male.   Patient was identified using dual identification of name and date of birth.     HPI       Hospital discharge follow-up  Logan Bright  is a 86 y.o.  male  followed by Dr. Barry Dienes  He presents for a post-hospitalization follow up today.    His discharge summary was reviewed, and a thorough medication reconciliation was undertaken.    Reviewed hospital course with patient. Reviewed follow-up plan with patient. Reviewed current symptoms with patient.     Date of admission: 06/18/21  Date of discharge: 06/23/21  Date of Transition of Care Phone Call: 06/26/21  Today's Date:  06/27/21     A/P as below:   28M combined HFrEF 20% due to ICM s/p MDT CRT-D 2020, CAD s/p 2 stents at [facility] in 2002 (details unknown), VT  s/p ICD 2010 (upgraded to CRT-D 2020) s/p Ablation X 2 in 2015 treated with amiodaorne, CKD IIIB with BL cr. ~ 1.6-2, PAFib not on AC due to hx of hematuria, hx of cryptogenic stroke in 2017,  gout, mild cognitive impairment, Hypothyroidism and elevated BMI. He follows with Dr. Barry Dienes, and has been seen previously by Shon Baton, NP.     He was admitted from SNF with 11 pound weight gain with edema and AKI. Echo on 06/19/21 revealed EF 20%, mod MR, mod AI, CVP 10-20. He was diuresed with IV bumex to net negative 2L and weight trended down to 203 pounds. He was converted to oral bumex 1 mg daily with recs to consider SGLT2-I as outpatient.     Pt presents today reporting that he is doing well since d/c from the hospital. He does not complain about SOB or weakness, however, his daughter is also present and tells me that he does appear SOB walking short distances. Unclear what his weight was at facility upon discharge, however, he was 207 pounds this AM. He did walk down the [place] with PT and denied SOB or dizziness. He states he is making lots of urine. Daughter says he is not drinking much fluid - forgets to drink.          Vitals:    06/27/21 1229   BP: 120/78   BP Source: Arm, Right Upper   Pulse: 82   Temp: 36.1 ?C (97 ?F)   SpO2: 96%   O2 Device: None (Room air)   TempSrc: Skin   PainSc: Zero   Weight: 94.3 kg (208 lb)   Height: 182.9 cm (6')     Body mass index is 28.21 kg/m?[name]   Wt Readings from Last 3 Encounters:   06/27/21 94.3 kg (208 lb)   06/23/21 93.5 kg (206 lb 2.1 oz)   02/06/21 92.6 kg (204 lb 3.2 oz)        Past Medical History  Patient Active Problem List    Diagnosis Date Noted   ? Fluid overload 06/18/2021   ? Hypovitaminosis D 07/03/2020   ? Colitis 05/26/2020   ? Hematuria 05/26/2020   ? Acute kidney injury superimposed on CKD (HCC) 05/26/2020   ? Coronary artery disease involving native coronary artery of native heart without angina pectoris 05/23/2020   ? Diverticulitis 05/22/2020   ? Acute on chronic combined systolic and diastolic ACC/AHA stage C congestive heart failure (HCC) 03/30/2020   ? Cognitive impairment 03/30/2020   ? Cellulitis of scrotum 03/30/2020   ? Stage  3b chronic kidney disease (HCC) 03/30/2020   ? Cognitive dysfunction 03/09/2020   ? Shortness of breath 10/29/2019   ? Heart failure exacerbated by sotalol (HCC) 10/27/2019   ? Heart failure (HCC) 07/01/2019   ? History of polycythemia 12/14/2018   ? Hyponatremia 09/04/2018   ? Hypothyroidism 09/04/2018   ? Paroxysmal atrial fibrillation (HCC) 04/07/2018   ? Cryptogenic stroke [facility]) 02/06/2016     2017 - Eye doctor told him he'd had an old stroke after doing an eye exam    08/06/16 Carotid Ultrasound [facility]): Bilateral antegrade vertebral artery flow. No hemodynamically significant common or internal carotid artery stenosis.     ? Closed trimalleolar fracture of right ankle 11/12/2015   ? Debility 06/10/2013   ? Recurrent ventricular tachycardia w/ 2 VT ablations 04/2013 05/11/2013     Sustained VT w/ multiple ICD tachypacing and Shocks, failed amiodaron   05/05/13 & 05/12/13  VT ablations [facility] w/ impella support 4/15, Home off Coreg and losartan d/t post procedure hematoma, hypotension     ? AKI (acute kidney injury) (HCC) 04/16/2013   ? 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 Ermelinda Das.        ? Ischemic cardiomyopathy      6/02 - infarct 2 stents .  [facility]            4/04 - Surveillance stress test at [facility]: reportedly negative.  4/05 - Stress thallium study: EF 30%, LAD and RCA defects, elevated lung uptake.- Echo doppler: EF 25%, amt& inf hypokinesis,  apical akinesis, poss  thrombus, mild valvular Regurg       5/06 - Stress thallium: EF 22%, no signficant interval change.  08/2011 - Dobut echo.  EF 10-15%. Antero-apical akinesis/dyskinesis.  No ischemia.  04/2013 RegadenThall EF 23% Extensive Anterior infarct, no ischemia, unchanged from prior     ? Chronic HFrEF (heart failure with reduced ejection fraction) (HCC)       2003 - Status post ICD implant.    4/05 - EF 25-30% per stress thallium and echo doppler.  08/2011 - Echo EF 10-15%     ? Neurologic cardiac syncope        3/05 - Developed syncope while at restaurant, taken to ED.  Symptoms of diaphoresis                    and lightheadedness.          4/05 - Head up tilt study: positive for inducing hypotension 72/49. Produced symptoms                    c/w previous syncopal episode.     ? HTN (hypertension)                 2002 - Diagnosis established.     ? Dyslipidemia         09/26/2008  03/14/2009  02/26/2011  04/17/2012  03/31/2013   LDL 107 (H) 141 (H) 80 108 (H) 104 (H)   04/2013: Refuses Statins understands that they reduce recurrent CV events, still declines.      ? Gout    ? CKD (chronic kidney disease)      mild           ROS * see HPI      Physical Exam  Alert and appropriate, difficulty answering questions, limited by dementia  JVD 6 cm at 90 degrees with (-) HJR  No abdominal distention  Trace to 1+ bil LE edema  Breathing comfortably without wheezing, rales or rhonchi  Auscultated heart with normal S1S2, regular rhythm, no obvious murmur   Able to walk into clinic unassisted       Cardiovascular Studies  Echo 06/19/21  1. Severely impaired left ventricular systolic function with an EF of 20%.  The left ventricle is severely dilated, the end-diastolic dimension was 6.5 cm, however the end-diastolic volume was 317 mL with an index of 146 mL.  Squared.  2. 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  3. Unable to assess diastolic function.  4. The RV is probably dilated with preserved function.  5. Right-sided pacer leads were noted.  6. There is nonspecific mitral thickening with probably moderate MR.  7. Mild TR.  8. Aortic valve sclerosis without stenosis.  Mild to moderate AI.[name]  9. Unable to calculate a peak pulmonary pressure.  10. The IVC was consistent with an elevated right atrial pressure.  11. Aortic root was mildly dilated, the ascending aorta was moderately dilated.  12. No pericardial effusion.    ?  Prior study was obtained on 05/24/2020.  It demonstrated an EF of 20%, the end-diastolic dimension was 7.2 cm, the end-diastolic volume was 193 mL with an index of 98 mL/m?, the RV function was preserved, right-sided device leads were noted, there is nonspecific mitral valve thickening, there is aortic valve sclerosis without stenosis, Doppler interrogation was not performed, the aortic root was dilated, the IVC is consistent with a normal central venous pressure, there is no pericardial effusion.  ?  The LV systolic function remains similar in both studies.  The akinetic areas on the prior study have a somewhat more aneurysmal change on the current study.  Otherwise the 2 studies are similar.    PYP 09/07/18:  This study is negative and not suggestive of ATTR cardiac amyloidosis.  A negative exam does not exclude AL cardiac amyloidosis.    MPI 04/14/18:  This study is markedly abnormal.  Extensive, severely reduced mid to apical uptake defects in all cardiac segments, partially fixed.  Suggestive of extensive prior myocardial injury with mild peri-infarct ischemia (in the mid segments).this is unchanged compared to prior ADAC study (04/21/2012).  Current summed stress score is 55 (51 on prior ADAC study). Dilated left ventricle with severely reduced left ventricular ejection fraction, EF of 18% (20% on prior study).  High risk prognostic indicators for annual cardiovascular mortality remain present. Nonischemic response to ECG.          Problems Addressed Today  Encounter Diagnoses   Name Primary?   ? Chronic HFrEF (heart failure with reduced ejection fraction) (HCC) Yes   ? Ischemic cardiomyopathy    ? Recurrent ventricular tachycardia w/ 2 VT ablations 04/2013    ? Paroxysmal atrial fibrillation (HCC)    ? Coronary artery disease involving native coronary artery of native heart without angina pectoris    ? Stage 3b chronic kidney disease (HCC)    ? Hypertension, unspecified type        Assessment and Plan     HFrEF related to ICM  - Last LVEF 20% from echo 06/19/21  - NYHA class III Stage C  - Dry weight: 203 pounds  - euvolemic on  exam today  > PLAN:   - decrease bumex to 0.5 mg daily given AKI and wrote orders for nursing to make sure pt is drinking a cup of fluid/water every two hours throughout the day  - goal to achieve renal function sufficient enough to start SGLT2-I and stop bumex    GDMT Current Dose Changes made at visit   BB Toprol XL 12.5 mg daily     ACEI/ARB/ARNI No ACE/ARB/ARNI due to renal function     Aldosterone antagonist Spironolactone 25 mg daily      SGLT2i No SGLT2 inhibitor at this time due to renal function     Hydralazine/Nitrate NA     Ivabradine NA    HRMT  CRT-D    Diuretics Bumex 1 mg daily Bumex 0.5 mg daily   Cardiac rehab NA as resides in a SNF    Candidacy for Adv HF therapies       Coronary artery disease  Hx of PCI in 2002 [facility]   Details unknown  Denies anginal Symptoms  ?  Paroxysmal Atrial Fibrillation  CHA2DS2VASC is elevated.   Not on OAC due to hx of hematuria.   Managed by Dr. Barry Dienes.   ?  Chronic Kidney Disease Stage IIIB  BL Cr. ~ 1.6-2  Last creat 1.82 from 06/23/21  POC creat today 2.3  - decreasing diuretics and increasing fluid intake  - f/u BMP in a week  ?  VT s/p ICD 2010 upgraded to CRT-D 2020  S/p VT ablation in 2015  Treated with Amidarone    Hypertension  BP today 120/78  Currently on GDMT as per above       DM Type II   >  Lab Results   Component Value Date/Time    HGBA1C 6.6 (H) 03/30/2020 02:00 AM    HGBA1C 6.6 (H) 10/28/2019 04:36 AM    HGBA1C 6.6 (H) 03/31/2013 06:35 AM        Dyslipidemia  Not on STATINS due to patient refusal per chart review.        Lab Results   Component Value Date/Time    CHOL 81 03/30/2020 04:12 AM    TRIG 63 03/30/2020 04:12 AM    HDL 39 (L) 03/30/2020 04:12 AM    LDL 41 03/30/2020 04:12 AM    VLDL 13 03/30/2020 04:12 AM    NONHDLCHOL 42 03/30/2020 04:12 AM    CHOLHDLC 4 04/17/2012 12:00 AM               Current Medications (including today's revisions)  ? acetaminophen (TYLENOL EXTRA STRENGTH) 500 mg tablet Take one tablet by mouth every 6 hours as needed. Max of 4,000 mg of acetaminophen in 24 hours.   ? allopurinoL (ZYLOPRIM) 300 mg tablet Take one tablet by mouth at bedtime daily.   ? amiodarone (CORDARONE) 200 mg tablet Take one tablet by mouth daily. Take with food.   ? aspirin 81 mg chewable tablet Chew one tablet by mouth daily. Take with food.   ? bumetanide (BUMEX) 1 mg tablet Take one-half tablet by mouth daily.   ? calcium carbonate (TUMS E-X) 300 mg (750 mg) chewable tablet Chew two tablets by mouth at bedtime daily.   ? CHOLEcalciferoL (vitamin D3) (VITAMIN D-3) 5000 unit tablet Take one tablet by mouth daily.   ? docusate (COLACE) 100 mg capsule Take one capsule by mouth twice daily.   ? finasteride (PROSCAR) 5 mg tablet Take one tablet by mouth daily.   ?  hydrocortisone (HYTONE) 2.5 % topical cream Apply  topically to affected area twice daily. Apply to areas of rash on face/groin/underarms   ? ketoconazole (NIZORAL) 2 % topical cream Apply  topically to affected area twice daily.   ? levothyroxine (SYNTHROID) 50 mcg tablet TAKE 1 TABLET BY MOUTH EVERY DAY 30 MINUTES BEFORE BREAKFAST   ? metoprolol XL (TOPROL XL) 25 mg extended release tablet Take one-half tablet by mouth daily. HOLD IF SBP < 100   ? nystatin (NYSTOP) 100,000 unit/g topical powder Apply  topically to affected area twice daily.   ? ondansetron HCL (ZOFRAN) 4 mg tablet Take one tablet by mouth every 6 hours as needed for Nausea or Vomiting.   ? polyethylene glycol 3350 (MIRALAX) 17 g packet Take one packet by mouth daily as needed (Constipation - Second Line).   ? senna/docusate (SENOKOT-S) 8.6/50 mg tablet Take one tablet by mouth daily as needed (Constipation - First Line).   ? spironolactone (ALDACTONE) 25 mg tablet Take one tablet by mouth daily. Take with food.   ? tamsulosin (FLOMAX) 0.4 mg capsule Take one capsule by mouth daily after breakfast. Do not crush, chew or open capsules. Take 30 minutes following the same meal each day.   ? thiamine (VITAMIN B-1) 100 mg tablet Take one tablet by mouth daily.   ? triamcinolone acetonide (KENALOG) 0.1 % topical ointment Apply  topically to affected area twice daily. Apply to areas of rash on body, do NOT use on face/groin/underarms            Total Time Today was 40 minutes in the following activities: Preparing to see the patient, Obtaining and/or reviewing separately obtained history, Performing a medically appropriate examination and/or evaluation, Counseling and educating the patient/family/caregiver, Ordering medications, tests, or procedures, Referring and communication with other health care professionals (when not separately reported) and Documenting clinical information in the electronic or other health record     Sue Lush,  CHFN, APRN, NP-C  Advanced Heart Failure APP  [facility]      Collaborating physician Kathleen Argue, MD

## 2021-07-12 ENCOUNTER — Encounter: Admit: 2021-07-12 | Discharge: 2021-07-12 | Payer: MEDICARE

## 2021-07-12 ENCOUNTER — Inpatient Hospital Stay: Admit: 2021-07-12 | Payer: MEDICARE

## 2021-07-12 ENCOUNTER — Emergency Department: Admit: 2021-07-12 | Discharge: 2021-07-12 | Payer: MEDICARE

## 2021-07-12 DIAGNOSIS — I5023 Acute on chronic systolic (congestive) heart failure: Secondary | ICD-10-CM

## 2021-07-12 DIAGNOSIS — I5043 Acute on chronic combined systolic (congestive) and diastolic (congestive) heart failure: Secondary | ICD-10-CM

## 2021-07-12 DIAGNOSIS — N179 Acute kidney failure, unspecified: Secondary | ICD-10-CM

## 2021-07-12 DIAGNOSIS — E8779 Other fluid overload: Secondary | ICD-10-CM

## 2021-07-12 LAB — HIGH SENSITIVITY TROPONIN I 0 HOUR: HIGH SENSITIVITY TROPONIN I 0 HOUR: 29 ng/L — ABNORMAL HIGH (ref <20)

## 2021-07-12 LAB — COMPREHENSIVE METABOLIC PANEL
ALBUMIN: 3.8 g/dL (ref 3.5–5.0)
ALK PHOSPHATASE: 116 U/L — ABNORMAL HIGH (ref 25–110)
ALT: 12 U/L (ref 7–56)
ANION GAP: 12 K/UL (ref 3–12)
AST: 18 U/L (ref 7–40)
CALCIUM: 8 mg/dL — ABNORMAL LOW (ref 8.5–10.6)
CO2: 23 MMOL/L — ABNORMAL HIGH (ref 21–30)
CREATININE: 2.2 mg/dL — ABNORMAL HIGH (ref 0.4–1.24)
EGFR: 29 mL/min — ABNORMAL LOW (ref >60)
GLUCOSE,PANEL: 118 mg/dL — ABNORMAL HIGH (ref 70–100)
SODIUM: 138 MMOL/L (ref 137–147)
TOTAL BILIRUBIN: 0.6 mg/dL (ref 0.3–1.2)
TOTAL PROTEIN: 6.9 g/dL (ref 6.0–8.0)

## 2021-07-12 LAB — CBC AND DIFF
ABSOLUTE BASO COUNT: 0 K/UL (ref 0–0.20)
ABSOLUTE EOS COUNT: 1.1 K/UL — ABNORMAL HIGH (ref 0–0.45)
ABSOLUTE MONO COUNT: 0.6 K/UL (ref 0–0.80)
WBC COUNT: 9.2 K/UL (ref 4.5–11.0)

## 2021-07-12 LAB — BNP POC ER: BNP POC: 405 pg/mL — ABNORMAL HIGH (ref 0–100)

## 2021-07-12 LAB — MAGNESIUM: MAGNESIUM: 2.3 mg/dL — ABNORMAL HIGH (ref 1.6–2.6)

## 2021-07-12 LAB — HIGH SENSITIVITY TROPONIN I 2 HOUR: HIGH SENSITIVITY TROPONIN I 2 HOUR: 24 ng/L — ABNORMAL HIGH (ref <20)

## 2021-07-12 MED ORDER — ALLOPURINOL 300 MG PO TAB
300 mg | Freq: Every evening | ORAL | 0 refills | Status: AC
Start: 2021-07-12 — End: ?
  Administered 2021-07-13 – 2021-07-18 (×6): 300 mg via ORAL

## 2021-07-12 MED ORDER — SENNOSIDES-DOCUSATE SODIUM 8.6-50 MG PO TAB
1 | Freq: Every day | ORAL | 0 refills | Status: AC | PRN
Start: 2021-07-12 — End: ?

## 2021-07-12 MED ORDER — METOPROLOL SUCCINATE 25 MG PO TB24
12.5 mg | Freq: Every day | ORAL | 0 refills | Status: AC
Start: 2021-07-12 — End: ?
  Administered 2021-07-13 – 2021-07-18 (×6): 12.5 mg via ORAL

## 2021-07-12 MED ORDER — ASPIRIN 81 MG PO CHEW
81 mg | Freq: Every day | ORAL | 0 refills | Status: AC
Start: 2021-07-12 — End: ?
  Administered 2021-07-13 – 2021-07-18 (×6): 81 mg via ORAL

## 2021-07-12 MED ORDER — ONDANSETRON 4 MG PO TBDI
4 mg | ORAL | 0 refills | Status: AC | PRN
Start: 2021-07-12 — End: ?

## 2021-07-12 MED ORDER — LEVOTHYROXINE 25 MCG PO TAB
50 ug | Freq: Every day | ORAL | 0 refills | Status: AC
Start: 2021-07-12 — End: ?
  Administered 2021-07-13 – 2021-07-18 (×6): 50 ug via ORAL

## 2021-07-12 MED ORDER — FINASTERIDE 5 MG PO TAB
5 mg | Freq: Every day | ORAL | 0 refills | Status: AC
Start: 2021-07-12 — End: ?
  Administered 2021-07-13 – 2021-07-18 (×6): 5 mg via ORAL

## 2021-07-12 MED ORDER — SPIRONOLACTONE 25 MG PO TAB
25 mg | Freq: Every day | ORAL | 0 refills | Status: AC
Start: 2021-07-12 — End: ?

## 2021-07-12 MED ORDER — BUMETANIDE 0.25 MG/ML IJ SOLN
2 mg | Freq: Two times a day (BID) | INTRAVENOUS | 0 refills | Status: AC
Start: 2021-07-12 — End: ?
  Administered 2021-07-13: 14:00:00 2 mg via INTRAVENOUS

## 2021-07-12 MED ORDER — HYDROCORTISONE 2.5 % TP CREA
Freq: Two times a day (BID) | TOPICAL | 0 refills | Status: AC
Start: 2021-07-12 — End: ?
  Administered 2021-07-13: 14:00:00 via TOPICAL

## 2021-07-12 MED ORDER — MELATONIN 5 MG PO TAB
5 mg | Freq: Every evening | ORAL | 0 refills | Status: AC | PRN
Start: 2021-07-12 — End: ?

## 2021-07-12 MED ORDER — HEPARIN, PORCINE (PF) 5,000 UNIT/0.5 ML IJ SYRG
5000 [IU] | SUBCUTANEOUS | 0 refills | Status: AC
Start: 2021-07-12 — End: ?
  Administered 2021-07-12 – 2021-07-18 (×17): 5000 [IU] via SUBCUTANEOUS

## 2021-07-12 MED ORDER — AMIODARONE 200 MG PO TAB
200 mg | Freq: Every day | ORAL | 0 refills | Status: AC
Start: 2021-07-12 — End: ?
  Administered 2021-07-13 – 2021-07-18 (×6): 200 mg via ORAL

## 2021-07-12 MED ORDER — CEPHALEXIN 500 MG PO CAP
500 mg | Freq: Four times a day (QID) | ORAL | 0 refills | Status: DC
Start: 2021-07-12 — End: 2021-07-12

## 2021-07-12 MED ORDER — CEPHALEXIN 500 MG PO CAP
500 mg | Freq: Three times a day (TID) | ORAL | 0 refills | Status: AC
Start: 2021-07-12 — End: ?
  Administered 2021-07-12 – 2021-07-18 (×19): 500 mg via ORAL

## 2021-07-12 MED ORDER — TAMSULOSIN 0.4 MG PO CAP
.4 mg | Freq: Every day | ORAL | 0 refills | Status: AC
Start: 2021-07-12 — End: ?
  Administered 2021-07-13 – 2021-07-18 (×6): 0.4 mg via ORAL

## 2021-07-12 MED ORDER — ACETAMINOPHEN 325 MG PO TAB
650 mg | ORAL | 0 refills | Status: AC | PRN
Start: 2021-07-12 — End: ?
  Administered 2021-07-12: 23:00:00 650 mg via ORAL

## 2021-07-12 MED ORDER — POLYETHYLENE GLYCOL 3350 17 GRAM PO PWPK
1 | Freq: Every day | ORAL | 0 refills | Status: AC | PRN
Start: 2021-07-12 — End: ?
  Administered 2021-07-17: 14:00:00 17 g via ORAL

## 2021-07-12 MED ORDER — BUMETANIDE 0.25 MG/ML IJ SOLN
1 mg | Freq: Once | INTRAVENOUS | 0 refills | Status: DC
Start: 2021-07-12 — End: 2021-07-12

## 2021-07-12 MED ORDER — ONDANSETRON HCL (PF) 4 MG/2 ML IJ SOLN
4 mg | INTRAVENOUS | 0 refills | Status: AC | PRN
Start: 2021-07-12 — End: ?

## 2021-07-12 MED ORDER — TRIAMCINOLONE ACETONIDE 0.1 % TP OINT
Freq: Two times a day (BID) | TOPICAL | 0 refills | Status: AC
Start: 2021-07-12 — End: ?
  Administered 2021-07-13 – 2021-07-17 (×3): via TOPICAL

## 2021-07-12 MED ORDER — BUMETANIDE 0.25 MG/ML IJ SOLN
1.5 mg | Freq: Once | INTRAVENOUS | 0 refills | Status: CP
Start: 2021-07-12 — End: ?
  Administered 2021-07-12: 17:00:00 1.5 mg via INTRAVENOUS

## 2021-07-12 NOTE — Research Notes
Research Informed Consent Note    NAME: Logan Bright "[name]             MRN: [id number]             DOB:28-Feb-1935          AGE: 86 y.o.    Study: Clinical Evaluation of the i-STAT hs-TnI Cartridge for Non-Anticoagulated Whole Blood Specimens and Lithium Heparin Plasma Separator Tube Whole Blood and Plasma Specimens on the i-STAT 1 Analyzer and i-STAT Alinity Instrument  IRB Number: [id number]  Consent Approval Date: Jun 05, 2021  Subject #: [id number]    The purpose of this study is to demonstrate that the i-STAT hs-TnI test results are equivalent when collected in lithium heparin tubes, lithium heparin PST gel tubes, and non-anticoagulated syringes. A one-time blood sample of 11.96mL (31mL non-anticoagulated syringe, 72mL LiHep, and 4.67mL PST LiHep) will be collected for research-specific testing. This study is considered minimal risk.    Clinical research participation and research nature of the trial were discussed with subject. The subject was alert and oriented during consent discussion. Subject was informed that study is voluntary and that they may withdraw consent at any time for any reason by notifying study team.  Study purpose, procedures, benefits, risks and duration of the study, confidentiality information and compensation were discussed.  Alternatives to participation were discussed per consent form.  Subject verbalized understanding.    Subject was provided time to review the consent form.  All questions asked were answered.  Subject voiced desire to participate in the study and signed the informed consent form without coercion and undue influence at 1440 on 07/12/2021.  A copy of the signed consent was given to the subject as well as contact information for the study team.  A copy of the signed consent was placed in the Emergency Department medical records box to be scanned into the subjects EMR.    No research procedures took place prior to consenting.

## 2021-07-12 NOTE — Progress Notes
Heart Failure Nursing Progress Note    Admission Date: 07/12/2021  LOS: 0 days    Admission Weight: 98.9 kg (218 lb)        Most recent weights (inpatient):   Vitals:    07/12/21 1023 07/12/21 1556   Weight: 98.9 kg (218 lb) 95.9 kg (211 lb 6.4 oz)     Weight change from previous day:n/a    Fluid restriction ordered: n/a    Intake/Output Summary: (Last 24 hours)    Intake/Output Summary (Last 24 hours) at 07/12/2021 1811  Last data filed at 07/12/2021 1748  Gross per 24 hour   Intake 240 ml   Output 500 ml   Net -260 ml       Is patient incontinent No    Anticipated discharge date: tbd  Discharge goals: diurese      Daily Assessment of Patient Stated Goals:    Short Term Goal Identified by patient (Short Term=during hospitalization):  Fluid balance, pain management

## 2021-07-12 NOTE — ED Notes
Medtronic report showing fluid overload based on measurements  No arrhythmias, device otherwise ok.

## 2021-07-12 NOTE — ED Provider Notes
Logan Bright is a 86 y.o. male.    Chief Complaint:  Chief Complaint   Patient presents with   ? Edema     Edema to legs and back x 2 weeks that got significantly worse yesterday. Daughter report he was receently discharged from [place] [facility] for am issue and has hx of chf. Pt endorses pain to his lower back that is relieved with Tylenol. Daughter also rports they have been lowering his Bumex dose and having a full body rash that the dr believed was from him being to dried out. Daughter reports cortizone cream has been ineffective in tx of rash. Pt comes from LTC facility. Pitting edema and redness noted to BLE       History of Present Illness:  86yo M with PMH of HTN, HLD, HFrEF, pAfib, CKD3 who presents to the ER from LTC for worsening bilateral LE edema. Pt is accompanied by his daughter who him to the ER today from LTC.  Patient was recently admitted to the hospital for heart failure exacerbation in May.  Since then, he saw his cardiologist on 06/27/2021 and Bumex was decreased from 1 mg daily to 0.5 mg daily given creatinine increase and exam.  Since then, he has had progressively worsening lower extremity edema, worsening shortness of breath and cough, worsening abdominal distention and orthopnea.  His daughter reports weeping fluid from legs started within the last week.  She visited him in the LTC and decided to bring him in.  He denies any chest pain, abdominal pain, fevers, chills, nausea or vomiting.  His last bowel movement was yesterday.  He has not had no trouble urinating. He reports not feeling defibrillator shock.     He also has a full body rash has been ongoing for the last 3 months.  He saw dermatology during his last hospitalization and impression was eczematous dermatitis.  He was discharged on topical steroids.  His daughter reports this has not been helping with the rash.  He has pruritus.       Review of Systems:  Review of Systems   Constitutional: Negative for chills and fever.   Respiratory: Positive for cough and shortness of breath.    Cardiovascular: Negative for chest pain.   Gastrointestinal: Positive for abdominal distention. Negative for abdominal pain, constipation, diarrhea, nausea and vomiting.   Genitourinary: Negative for difficulty urinating and dysuria.   Skin: Positive for rash.       Allergies:  Chlorhexidine gluconate and Colchicine    Past Medical History:  Medical History:   Diagnosis Date   ? Arthritis    ? CAD (coronary artery disease)    ? Dyslipidemia    ? Fracture     RT ANKLE   ? History of ventricular tachycardia    ? HTN (hypertension)    ? ICD (implantable cardiac defibrillator) in place    ? Ischemic cardiomyopathy    ? Neurologic cardiac syncope    ? Renal insufficiency     mild   ? S/P coronary artery stent placement        Past Surgical History:  Surgical History:   Procedure Laterality Date   ? EXTERNAL FIXATION APPLICATION ANKLE Right 11/13/2015    Performed by Azzie Glatter, MD at [facility] OR   ? EXTERNAL FIXATION REMOVAL RIGHT ANKLE Right 12/08/2015    Performed by Azzie Glatter, MD at [facility] OR   ? OPEN REDUCTION INTERNAL FIXATION RIGHT ANKLE Right 12/08/2015    Performed by Desiree Hane,  Margit Banda, MD at [facility] OR   ? INSERTION LEAD LEFT VENTRICLE AT TIME OF INSERTION OF PACEMAKER/ IMPLANTABLE DEFIBRILLATOR Left 09/09/2018    Performed by Annamarie Dawley, MD at [facility] EP LAB   ? REMOVAL AND REPLACEMENT PERMANENT PACEMAKER GENERATOR  - MULTIPLE LEAD SYSTEM Left 09/09/2018    Performed by Annamarie Dawley, MD at [facility] EP LAB   ? CARDIAC DEFIBRILLATOR PLACEMENT     ? HX HEART CATHETERIZATION     ? HX ICD PLACEMENT     ? HX PACEMAKER PLACEMENT         Pertinent medical/surgical history reviewed    Social History:  Social History     Tobacco Use   ? Smoking status: Never   ? Smokeless tobacco: Never   Vaping Use   ? Vaping Use: Never used   Substance Use Topics   ? Alcohol use: Never   ? Drug use: Never     Social History     Substance and Sexual Activity   Drug Use Never             Family History:  Family History   Problem Relation Age of Onset   ? Cancer Mother    ? Gout Mother    ? Cancer Sister    ? Diabetes Sister    ? Heart Attack Sister    ? Hypertension Sister    ? Gout Sister    ? Arthritis-osteo Sister    ? Stroke Sister    ? Thyroid Disease Sister    ? Migraines Sister        Vitals:  ED Vitals    Date and Time T BP P RR SPO2P SPO2 User   07/12/21 1400 -- 118/63 68 21 PER MINUTE 76 95 % SH   07/12/21 1330 -- 132/71 63 19 PER MINUTE 62 96 % SH   07/12/21 1300 -- 116/65 60 26 PER MINUTE 58 94 % SH   07/12/21 1230 -- 122/64 60 17 PER MINUTE 60 94 % SH   07/12/21 1200 -- 113/63 60 19 PER MINUTE 60 93 % [facility]   07/12/21 1130 -- 114/62 60 17 PER MINUTE 60 96 % SH   07/12/21 1100 -- 111/71 60 14 PER MINUTE 60 98 % SH   07/12/21 1023 -- 130/70 70 18 PER MINUTE -- 97 % KH   07/12/21 1022 36.3 ?C (97.4 ?F) -- -- -- -- -- ES          Physical Exam:  Physical Exam  Vitals and nursing note reviewed.   Constitutional:       General: He is not in acute distress.     Appearance: Normal appearance.   HENT:      Head: Normocephalic and atraumatic.   Eyes:      Extraocular Movements: Extraocular movements intact.      Conjunctiva/sclera: Conjunctivae normal.   Neck:      Comments: JVD  Cardiovascular:      Rate and Rhythm: Normal rate and regular rhythm.   Pulmonary:      Effort: Pulmonary effort is normal. No respiratory distress.      Breath sounds: Normal breath sounds. No wheezing or rales.   Abdominal:      General: Bowel sounds are normal. There is distension.      Palpations: Abdomen is soft.      Tenderness: There is no abdominal tenderness. There is no guarding or rebound.   Musculoskeletal:  General: Normal range of motion.      Cervical back: Neck supple.      Right lower leg: Edema (2+ to knee) present.      Left lower leg: Edema (2+ to knee) present.   Skin:     General: Skin is warm and dry.      Comments: Diffuse maculopapular erythematous rash with overlying excoriation    Neurological: Mental Status: He is oriented to person, place, and time. Mental status is at baseline.   Psychiatric:         Mood and Affect: Mood normal.         Behavior: Behavior normal.         Laboratory Results:  Labs Reviewed   CBC AND DIFF - Abnormal       Result Value Ref Range Status    White Blood Cells 9.2  4.5 - 11.0 K/UL Final    RBC 4.97  4.4 - 5.5 M/UL Final    Hemoglobin 15.9  13.5 - 16.5 GM/DL Final    Hematocrit 40.9  40 - 50 % Final    MCV 98.5  80 - 100 FL Final    MCH 32.0  26 - 34 PG Final    MCHC 32.5  32.0 - 36.0 G/DL Final    RDW 81.1 (*) 11 - 15 % Final    Platelet Count 175  150 - 400 K/UL Final    MPV 9.0  7 - 11 FL Final    Neutrophils 75  41 - 77 % Final    Lymphocytes 6 (*) 24 - 44 % Final    Monocytes 7  4 - 12 % Final    Eosinophils 12 (*) 0 - 5 % Final    Basophils 0  0 - 2 % Final    Absolute Neutrophil Count 6.80  1.8 - 7.0 K/UL Final    Absolute Lymph Count 0.57 (*) 1.0 - 4.8 K/UL Final    Absolute Monocyte Count 0.67  0 - 0.80 K/UL Final    Absolute Eosinophil Count 1.12 (*) 0 - 0.45 K/UL Final    Absolute Basophil Count 0.03  0 - 0.20 K/UL Final   COMPREHENSIVE METABOLIC PANEL - Abnormal    Sodium 138  137 - 147 MMOL/L Final    Potassium 4.3  3.5 - 5.1 MMOL/L Final    Chloride 103  98 - 110 MMOL/L Final    Glucose 118 (*) 70 - 100 MG/DL Final    Blood Urea Nitrogen 49 (*) 7 - 25 MG/DL Final    Creatinine 9.14 (*) 0.4 - 1.24 MG/DL Final    Calcium 8.0 (*) 8.5 - 10.6 MG/DL Final    Total Protein 6.9  6.0 - 8.0 G/DL Final    Total Bilirubin 0.6  0.3 - 1.2 MG/DL Final    Albumin 3.8  3.5 - 5.0 G/DL Final    Alk Phosphatase 116 (*) 25 - 110 U/L Final    AST (SGOT) 18  7 - 40 U/L Final    CO2 23  21 - 30 MMOL/L Final    ALT (SGPT) 12  7 - 56 U/L Final    Anion Gap 12  3 - 12 Final    eGFR 29 (*) >60 mL/min Final   HIGH SENSITIVITY TROPONIN I 0 HOUR - Abnormal    hs Troponin I 0 Hour 29 (*) <20 ng/L Final   HIGH SENSITIVITY TROPONIN I 2 HOUR - Abnormal  hs Troponin I 2 Hour 24 (*) <20 ng/L Final   BNP POC ER - Abnormal    BNP POC 405.0 (*) 0 - 100 PG/ML Final   NT-PRO-BNP - Abnormal    NT-Pro-BNP 1,336.0 (*) <450 pg/mL Final   MAGNESIUM    Magnesium 2.3  1.6 - 2.6 mg/dL Final   POC BNP          Radiology Interpretation:    CHEST SINGLE VIEW   Final Result      Stable mild cardiac silhouette enlargement with similar prominence of the central vasculature which could reflect early edema. No new superimposed consolidation.          Finalized by Micki Riley, MD on 07/12/2021 12:08 PM. Dictated by Micki Riley, MD on 07/12/2021 12:06 PM.         POC ED [name] CARDIAC LIMITED    (Results Pending)         EKG:  ECG Results          [facility] ED MAIN ECG TRIAGE ONLY (Final result)      Collection Time Result Time VT RATE P-R Interval QRS DURATION Q-T Interval QTC Calc Bazett    07/12/21 11:08:58 07/12/21 15:52:14 60 178 190 554 554         Collection Time Result Time P Axis R Axis T Axis    07/12/21 11:08:58 07/12/21 15:52:14  -75 71               Final result                 Impression:    AV dual-paced rhythm rate 60  Abnormal ECG  Confirmed by Linden Dolin ([id number]) on 07/12/2021 3:52:06 PM                              Medical Decision Making:  Logan Bright is a 86 y.o. male who presents with chief complaint as listed above. Based on the history and presentation, the list of differential diagnoses considered included, but was not limited to, heart failure exacerbation, ACS, AKI, other    ED Course  - Patient seen and examined by resident and attending for LE edema   - Vitals signs were noted to be stable  - Physical exam as above   - Differential diagnosis initially includes, but not limited to HFrEF Exacerbation, ACS, AKI, or other etiologies. Upon further workup, suspect HFrEF exacerbation complicated by cardiorenal syndrome  - Workup including imaging and labs  - Labs were notable for unremarkable CBC, CMP with Cr 2.20 which is 0.4 bump from recent labs, proBNP 1336, hs trop 29 then 2 hours later 24  - Imaging as above  - Patient was given 1.5mg  IV bumex in the ED and had output  - His pacemaker and defibrillator was interrogated and showed no events   - Patient was admitted to medicine for further management of HFrEF exacerbation and cardiorenal syndrome    ED Course as of 07/12/21 1649   Thu Jul 12, 2021   1216 CHEST SINGLE VIEW [AK]   1217 hs Troponin I 0 Hour(!): 29 [AK]   1217 High Sensitivity Troponin I 0 Hour(!) [AK]   1300 NT-Pro-BNP(!): 1,336.0 [AK]      ED Course User Index  [AK] Frederico Hamman, MD       Complexity of Problems Addressed  Patient's active diagnoses as well as contributing pre-existing  medical problems include:  Clinical Impression   Acute on chronic combined systolic and diastolic congestive heart failure (HCC)   Cardiac volume overload   AKI (acute kidney injury) (HCC)     Evaluation performed for severe exacerbation, progression, or side effect of treatment during this visit given the initial differential diagnosis as discussed previously in MDM/ED course.     Additional data reviewed:    ? History was obtained from an independent historian: Family  ? Prior non-ED notes reviewed: Recent discharge summary and/or H&P and Clinic note  ? Independent interpretation of diagnostic tests was performed by me: Not in addition to what is mentioned above  ? Patient presentation/management was discussed with the following qualified health care professionals and/or other relevant professionals: Admitting Physician    Risk evaluation:    ? Diagnosis or treatment of patient condition impacted by social determinant of health: None  ? Tests Considered but not performed due to clinical scoring (if not mentioned in ED course, aside from what is implied by clinical scores listed): n/a  ? Rationale regarding whether admission or escalation of care considered if not performed (if not mentioned in ED course, aside from what is implied by clinical scores listed): n/a    ED Scoring:       Facility Administered Meds:  Medications   bumetanide (BUMEX) injection 1.5 mg (1.5 mg Intravenous Given 07/12/21 1216)       Clinical Impression:  Clinical Impression   Acute on chronic combined systolic and diastolic congestive heart failure (HCC)   Cardiac volume overload   AKI (acute kidney injury) (HCC)       Disposition/Follow up  ED Disposition     ED Disposition   Admit           No follow-up provider specified.    Medications:  Current Discharge Medication List          Procedure Notes:  Procedures       Attestation / Supervision:  Pt staffed with Dr. Neva Seat, MD

## 2021-07-12 NOTE — Progress Notes
RT Adult Assessment Note    NAME:Logan Bright             MRN: [id number]             DOB:1935-12-09          AGE: 86 y.o.  ADMISSION DATE: 07/12/2021             DAYS ADMITTED: LOS: 0 days    RT Treatment Plan:       Protocol Plan: Procedures  PAP: Place a nursing order for "IS Q1h While Awake" for any of Lung Expansion indicators    Additional Comments:  Impressions of the patient: PT resting on RA.  Intervention(s)/outcome(s): eval  Patient education that was completed: none  Recommendations to the care team: IS    Vital Signs:  Pulse: 61  RR: 18 PER MINUTE  SpO2: 95 %  O2 Device: None (Room air)  Liter Flow:    O2%:    Breath Sounds: Clear (Implies normal);Decreased  Respiratory Effort: Unlabored

## 2021-07-12 NOTE — ED Notes
Interrogated Med[employer] ICD at this time, awaiting faxed report.

## 2021-07-13 ENCOUNTER — Inpatient Hospital Stay: Admit: 2021-07-13 | Discharge: 2021-07-13 | Payer: MEDICARE

## 2021-07-13 MED ADMIN — BUMETANIDE 0.25 MG/ML IJ SOLN [9308]: 0.5 mg/h | INTRAVENOUS | @ 20:00:00 | NDC 72205010201

## 2021-07-13 MED ADMIN — BUMETANIDE 0.25 MG/ML IJ SOLN [9308]: 1 mg | INTRAVENOUS | @ 20:00:00 | Stop: 2021-07-13 | NDC 72205010101

## 2021-07-14 MED ADMIN — BUMETANIDE 0.25 MG/ML IJ SOLN [9308]: 4 mg | INTRAVENOUS | @ 18:00:00 | Stop: 2021-07-14 | NDC 72205010101

## 2021-07-14 MED ADMIN — BUMETANIDE 0.25 MG/ML IJ SOLN [9308]: 0.5 mg/h | INTRAVENOUS | @ 08:00:00 | NDC 72205010201

## 2021-07-15 MED ADMIN — CALCIUM GLUC IN NACL, ISO-OSM 1 GRAM/100 ML IV SOLN [457594]: 1 g | INTRAVENOUS | @ 02:00:00 | Stop: 2021-07-15 | NDC 44567062201

## 2021-07-15 MED ADMIN — BUMETANIDE 0.25 MG/ML IJ SOLN [9308]: 1 mg/h | INTRAVENOUS | @ 02:00:00 | NDC 72205010201

## 2021-07-15 MED ADMIN — BUMETANIDE 0.25 MG/ML IJ SOLN [9308]: 1 mg/h | INTRAVENOUS | @ 12:00:00 | Stop: 2021-07-16 | NDC 72205010201

## 2021-07-15 MED ADMIN — BUMETANIDE 0.25 MG/ML IJ SOLN [9308]: 4 mg | INTRAVENOUS | @ 21:00:00 | Stop: 2021-07-15 | NDC 72205010101

## 2021-07-16 MED ADMIN — BUMETANIDE 2 MG PO TAB [9311]: 2 mg | ORAL | @ 18:00:00 | NDC 50268013211

## 2021-07-16 MED ADMIN — CALCIUM GLUC IN NACL, ISO-OSM 1 GRAM/100 ML IV SOLN [457594]: 1 g | INTRAVENOUS | @ 14:00:00 | Stop: 2021-07-16 | NDC 44567062201

## 2021-07-16 MED ADMIN — BUMETANIDE 0.25 MG/ML IJ SOLN [9308]: 1 mg/h | INTRAVENOUS | @ 01:00:00 | Stop: 2021-07-16 | NDC 72205010201

## 2021-07-16 MED ADMIN — POTASSIUM CHLORIDE 20 MEQ PO TBTQ [35943]: 20 meq | ORAL | @ 14:00:00 | Stop: 2021-07-16 | NDC 00832532511

## 2021-07-17 MED ADMIN — BUMETANIDE 2 MG PO TAB [9311]: 2 mg | ORAL | @ 13:00:00 | NDC 50268013211

## 2021-07-18 MED ADMIN — BUMETANIDE 2 MG PO TAB [9311]: 2 mg | ORAL | @ 13:00:00 | Stop: 2021-07-18 | NDC 50268013211

## 2021-07-19 ENCOUNTER — Encounter: Admit: 2021-07-19 | Discharge: 2021-07-19 | Payer: MEDICARE

## 2021-07-19 NOTE — Telephone Encounter
Hospital Discharge Follow Up      Reached Patient:No, transferred to Skilled Nursing Facility, [facility]  Patient Date of Birth: [birth year]     Admission Information:     Hospital Name: [facility]  Admission Date: 07/12/2021  Discharge Date: 07/18/2021    Admission Diagnosis: Acute on chronic HFrEF  Discharge Diagnosis: (Primary)Acute on chronic HFrEF, Dyslipidemia, Automatic implantable cardioverter-defibrillator in situ, Recurrent ventricular tachycardia w/2 VT ablations 04/2013, Paroxysmal atrial fibrillation, Cognitive impairment, Stage 3b chronic kidney disease, Coronary artery disease involving native coronary artery of native heart without angina pectoris  Has there been a discharge within the last 30 days? Yes  If yes, reason: 06/18/2021 Fluid overload  Hospital Services: Unplanned  Today's call is 1(business) days post discharge    Meds reviewed and reconciled?Yes  ? acetaminophen (TYLENOL EXTRA STRENGTH) 500 mg tablet Take one tablet by mouth every 6 hours as needed. Max of 4,000 mg of acetaminophen in 24 hours.   ? allopurinoL (ZYLOPRIM) 300 mg tablet Take one tablet by mouth at bedtime daily.   ? amiodarone (CORDARONE) 200 mg tablet Take one tablet by mouth daily. Take with food.   ? aspirin 81 mg chewable tablet Chew one tablet by mouth daily. Take with food.   ? bumetanide (BUMEX) 1 mg tablet Take two tablets by mouth daily.   ? calcium carbonate (TUMS E-X) 300 mg (750 mg) chewable tablet Chew two tablets by mouth at bedtime daily.   ? cephalexin (KEFLEX) 500 mg capsule Take one capsule by mouth three times daily for 3 doses. Indications: skin and skin structure infection   ? CHOLEcalciferoL (vitamin D3) (VITAMIN D-3) 5000 unit tablet Take one tablet by mouth daily.   ? docusate (COLACE) 100 mg capsule Take one capsule by mouth twice daily.   ? finasteride (PROSCAR) 5 mg tablet Take one tablet by mouth daily.   ? hydrocortisone (HYTONE) 2.5 % topical cream Apply  topically to affected area twice daily. Apply to areas of rash on face/groin/underarms (Patient taking differently: Apply  topically to affected area twice daily as needed. Apply to areas of rash on face/groin/underarms)   ? ketoconazole (NIZORAL) 2 % topical cream Apply  topically to affected area twice daily.   ? levothyroxine (SYNTHROID) 50 mcg tablet TAKE 1 TABLET BY MOUTH EVERY DAY 30 MINUTES BEFORE BREAKFAST   ? menthol/camphor (BIOFREEZE TP) Apply  topically to affected area three times daily as needed (low back pain).   ? metoprolol XL (TOPROL XL) 25 mg extended release tablet Take one-half tablet by mouth daily. HOLD IF SBP < 100   ? nystatin (NYSTOP) 100,000 unit/g topical powder Apply  topically to affected area twice daily.   ? ondansetron HCL (ZOFRAN) 4 mg tablet Take one tablet by mouth every 6 hours as needed for Nausea or Vomiting.   ? polyethylene glycol 3350 (MIRALAX) 17 g packet Take one packet by mouth daily as needed (Constipation - Second Line).   ? senna/docusate (SENOKOT-S) 8.6/50 mg tablet Take one tablet by mouth daily as needed (Constipation - First Line).   ? tamsulosin (FLOMAX) 0.4 mg capsule Take one capsule by mouth daily after breakfast. Do not crush, chew or open capsules. Take 30 minutes following the same meal each day.   ? thiamine (VITAMIN B-1) 100 mg tablet Take one tablet by mouth daily.     Scheduling Follow-up Appointment   Upcoming appointment date and time and with whom scheduled:   Future Appointments   Date Time  Provider Department Center   07/24/2021  1:00 PM Sue Lush, APRN-NP [facility] Exam   07/30/2021  3:00 PM Sue Lush, APRN-NP [facility] Exam   10/11/2021 10:00 AM Vanice Sarah, MD [facility] Exam     PCP appointment scheduled?No, no appointments made  PCP primary location: [facility]  Specialist appointment scheduled? Yes, with Cardiology 07/24/2021, 07/30/2021, and 10/11/2021  Both PCP and Specialist appointment scheduled: No  Is assistance with transportation needed?No   MyChart message sent? Inactive in MyChart.    Yancey Flemings, RN

## 2021-07-24 ENCOUNTER — Ambulatory Visit: Admit: 2021-07-24 | Discharge: 2021-07-24 | Payer: MEDICARE

## 2021-07-24 ENCOUNTER — Encounter: Admit: 2021-07-24 | Discharge: 2021-07-24 | Payer: MEDICARE

## 2021-07-24 DIAGNOSIS — I5022 Chronic systolic (congestive) heart failure: Secondary | ICD-10-CM

## 2021-07-24 DIAGNOSIS — I10 Essential (primary) hypertension: Secondary | ICD-10-CM

## 2021-07-24 DIAGNOSIS — I255 Ischemic cardiomyopathy: Secondary | ICD-10-CM

## 2021-07-24 DIAGNOSIS — M1990 Unspecified osteoarthritis, unspecified site: Secondary | ICD-10-CM

## 2021-07-24 DIAGNOSIS — Z95810 Presence of automatic (implantable) cardiac defibrillator: Secondary | ICD-10-CM

## 2021-07-24 DIAGNOSIS — I480 Paroxysmal atrial fibrillation: Secondary | ICD-10-CM

## 2021-07-24 DIAGNOSIS — N289 Disorder of kidney and ureter, unspecified: Secondary | ICD-10-CM

## 2021-07-24 DIAGNOSIS — T148XXA Other injury of unspecified body region, initial encounter: Secondary | ICD-10-CM

## 2021-07-24 DIAGNOSIS — I2510 Atherosclerotic heart disease of native coronary artery without angina pectoris: Secondary | ICD-10-CM

## 2021-07-24 DIAGNOSIS — R55 Syncope and collapse: Secondary | ICD-10-CM

## 2021-07-24 DIAGNOSIS — Z955 Presence of coronary angioplasty implant and graft: Secondary | ICD-10-CM

## 2021-07-24 DIAGNOSIS — Z8679 Personal history of other diseases of the circulatory system: Secondary | ICD-10-CM

## 2021-07-24 DIAGNOSIS — E785 Hyperlipidemia, unspecified: Secondary | ICD-10-CM

## 2021-07-24 MED ORDER — DAPAGLIFLOZIN PROPANEDIOL 10 MG PO TAB
10 mg | ORAL_TABLET | Freq: Every day | ORAL | 3 refills | Status: AC
Start: 2021-07-24 — End: ?

## 2021-07-24 MED ORDER — BUMETANIDE 1 MG PO TAB
2 mg | ORAL_TABLET | Freq: Two times a day (BID) | ORAL | 3 refills | Status: AC
Start: 2021-07-24 — End: ?

## 2021-07-24 MED ORDER — POTASSIUM CHLORIDE 20 MEQ PO TBTQ
20 meq | ORAL_TABLET | Freq: Every day | ORAL | 3 refills | 30.00000 days | Status: AC
Start: 2021-07-24 — End: ?

## 2021-07-25 ENCOUNTER — Encounter: Admit: 2021-07-25 | Discharge: 2021-07-25 | Payer: MEDICARE

## 2021-07-25 NOTE — Progress Notes
Attempted to contact Glennie Hawk to discuss insurance on medication: Farxiga 10 mg. Left voicemail asking patient to contact us at [phone].         [place] [facility] Advocate  [phone]

## 2021-07-26 ENCOUNTER — Encounter: Admit: 2021-07-26 | Discharge: 2021-07-26 | Payer: MEDICARE

## 2021-07-26 ENCOUNTER — Emergency Department: Admit: 2021-07-26 | Discharge: 2021-07-26 | Payer: MEDICARE

## 2021-07-26 ENCOUNTER — Inpatient Hospital Stay: Admit: 2021-07-26 | Payer: MEDICARE

## 2021-07-26 DIAGNOSIS — Z95810 Presence of automatic (implantable) cardiac defibrillator: Secondary | ICD-10-CM

## 2021-07-26 DIAGNOSIS — I5043 Acute on chronic combined systolic (congestive) and diastolic (congestive) heart failure: Secondary | ICD-10-CM

## 2021-07-26 DIAGNOSIS — Z955 Presence of coronary angioplasty implant and graft: Secondary | ICD-10-CM

## 2021-07-26 DIAGNOSIS — E785 Hyperlipidemia, unspecified: Secondary | ICD-10-CM

## 2021-07-26 DIAGNOSIS — T148XXA Other injury of unspecified body region, initial encounter: Secondary | ICD-10-CM

## 2021-07-26 DIAGNOSIS — I10 Essential (primary) hypertension: Secondary | ICD-10-CM

## 2021-07-26 DIAGNOSIS — R55 Syncope and collapse: Secondary | ICD-10-CM

## 2021-07-26 DIAGNOSIS — I5023 Acute on chronic systolic (congestive) heart failure: Secondary | ICD-10-CM

## 2021-07-26 DIAGNOSIS — N289 Disorder of kidney and ureter, unspecified: Secondary | ICD-10-CM

## 2021-07-26 DIAGNOSIS — I255 Ischemic cardiomyopathy: Secondary | ICD-10-CM

## 2021-07-26 DIAGNOSIS — I2510 Atherosclerotic heart disease of native coronary artery without angina pectoris: Secondary | ICD-10-CM

## 2021-07-26 DIAGNOSIS — Z8679 Personal history of other diseases of the circulatory system: Secondary | ICD-10-CM

## 2021-07-26 DIAGNOSIS — M1990 Unspecified osteoarthritis, unspecified site: Secondary | ICD-10-CM

## 2021-07-26 LAB — COMPREHENSIVE METABOLIC PANEL
ALBUMIN: 3.4 g/dL — ABNORMAL LOW (ref 3.5–5.0)
ALK PHOSPHATASE: 108 U/L — ABNORMAL LOW (ref 25–110)
ALT: 10 U/L (ref 7–56)
ANION GAP: 14 K/UL — ABNORMAL HIGH (ref 3–12)
AST: 16 U/L (ref 7–40)
BLD UREA NITROGEN: 44 mg/dL — ABNORMAL HIGH (ref 7–25)
CHLORIDE: 99 MMOL/L (ref 98–110)
CO2: 22 MMOL/L — ABNORMAL HIGH (ref 21–30)
EGFR: 28 mL/min — ABNORMAL LOW (ref >60)
POTASSIUM: 4.1 MMOL/L (ref 3.5–5.1)
SODIUM: 135 MMOL/L — ABNORMAL LOW (ref 137–147)
TOTAL BILIRUBIN: 0.4 mg/dL (ref 0.3–1.2)
TOTAL PROTEIN: 6.5 g/dL (ref 6.0–8.0)

## 2021-07-26 LAB — HIGH SENSITIVITY TROPONIN I 0 HOUR: HIGH SENSITIVITY TROPONIN I 0 HOUR: 35 ng/L — ABNORMAL HIGH (ref <20)

## 2021-07-26 LAB — CBC AND DIFF
ABSOLUTE BASO COUNT: 0 K/UL (ref 0–0.20)
ABSOLUTE EOS COUNT: 1.1 K/UL — ABNORMAL HIGH (ref 0–0.45)
ABSOLUTE MONO COUNT: 0.6 K/UL (ref 0–0.80)
MDW (MONOCYTE DISTRIBUTION WIDTH): 19 (ref <20.7)
WBC COUNT: 7.3 K/UL (ref 4.5–11.0)

## 2021-07-26 LAB — MAGNESIUM: MAGNESIUM: 2.1 mg/dL — ABNORMAL HIGH (ref 1.6–2.6)

## 2021-07-26 LAB — POC LACTATE: LACTIC ACID POC: 1.7 MMOL/L (ref 0.5–2.0)

## 2021-07-26 LAB — HIGH SENSITIVITY TROPONIN I 2 HOUR: HIGH SENSITIVITY TROPONIN I 2 HOUR: 33 ng/L — ABNORMAL HIGH (ref <20)

## 2021-07-26 MED ORDER — TAMSULOSIN 0.4 MG PO CAP
0.4 mg | Freq: Every day | ORAL | 0 refills
Start: 2021-07-26 — End: ?
  Administered 2021-07-27 – 2021-07-30 (×4): 0.4 mg via ORAL

## 2021-07-26 MED ORDER — AMIODARONE 200 MG PO TAB
200 mg | Freq: Every day | ORAL | 0 refills
Start: 2021-07-26 — End: ?
  Administered 2021-07-27 – 2021-07-30 (×4): 200 mg via ORAL

## 2021-07-26 MED ORDER — BUMETANIDE 0.25 MG/ML IJ SOLN
2 mg | Freq: Two times a day (BID) | INTRAVENOUS | 0 refills
Start: 2021-07-26 — End: ?
  Administered 2021-07-27: 14:00:00 2 mg via INTRAVENOUS

## 2021-07-26 MED ORDER — ONDANSETRON HCL (PF) 4 MG/2 ML IJ SOLN
4 mg | INTRAVENOUS | 0 refills | PRN
Start: 2021-07-26 — End: ?

## 2021-07-26 MED ORDER — METOPROLOL SUCCINATE 25 MG PO TB24
12.5 mg | Freq: Every day | ORAL | 0 refills
Start: 2021-07-26 — End: ?
  Administered 2021-07-27 – 2021-07-30 (×4): 12.5 mg via ORAL

## 2021-07-26 MED ORDER — BUMETANIDE 0.25 MG/ML IJ SOLN
2 mg | Freq: Once | INTRAVENOUS | 0 refills | Status: AC
Start: 2021-07-26 — End: ?
  Administered 2021-07-27: 06:00:00 2 mg via INTRAVENOUS

## 2021-07-26 MED ORDER — LEVOTHYROXINE 50 MCG PO TAB
50 ug | Freq: Every day | ORAL | 0 refills
Start: 2021-07-26 — End: ?
  Administered 2021-07-27 – 2021-07-30 (×4): 50 ug via ORAL

## 2021-07-26 MED ORDER — POLYETHYLENE GLYCOL 3350 17 GRAM PO PWPK
1 | Freq: Every day | ORAL | 0 refills | PRN
Start: 2021-07-26 — End: ?

## 2021-07-26 MED ORDER — SENNOSIDES-DOCUSATE SODIUM 8.6-50 MG PO TAB
1 | Freq: Every day | ORAL | 0 refills | PRN
Start: 2021-07-26 — End: ?

## 2021-07-26 MED ORDER — ALLOPURINOL 300 MG PO TAB
300 mg | Freq: Every evening | ORAL | 0 refills
Start: 2021-07-26 — End: ?
  Administered 2021-07-28: 02:00:00 300 mg via ORAL

## 2021-07-26 MED ORDER — HEPARIN, PORCINE (PF) 5,000 UNIT/0.5 ML IJ SYRG
5000 [IU] | SUBCUTANEOUS | 0 refills
Start: 2021-07-26 — End: ?
  Administered 2021-07-27 – 2021-07-30 (×9): 5000 [IU] via SUBCUTANEOUS

## 2021-07-26 MED ORDER — ONDANSETRON 4 MG PO TBDI
4 mg | ORAL | 0 refills | PRN
Start: 2021-07-26 — End: ?

## 2021-07-26 MED ORDER — MELATONIN 5 MG PO TAB
5 mg | Freq: Every evening | ORAL | 0 refills | PRN
Start: 2021-07-26 — End: ?
  Administered 2021-07-29 – 2021-07-30 (×2): 5 mg via ORAL

## 2021-07-26 MED ORDER — FINASTERIDE 5 MG PO TAB
5 mg | Freq: Every day | ORAL | 0 refills
Start: 2021-07-26 — End: ?
  Administered 2021-07-27 – 2021-07-30 (×4): 5 mg via ORAL

## 2021-07-26 NOTE — ED Notes
Pt is a 86 y.o. male who presents to the ED with cc of SOA and cellulitis. States multiple recent hospital visits for CHF exacerbation and was recently put on Bumex and sent home to his facility. Daughter reports no improvement with new prescription of Bumex. Pt continues to be SOA and increased edema and "weeping of legs." Denies chest pain, N/V/D. Denies use of 02 at home. RA at this time. NAD. Ax0x4. BLE edema saturated pants noted. Both legs are red and non-tender to palpation. States they hurt when he walks. Hooked up to all monitors with VSS. Dry, flaky and raised circular rash noted over trunk and limbs noted by this RN. Daughter reported this rash happened several weeks prior to CHF complications. Denies pain at this time. Chair in lowest locked position.  Medical History:   Diagnosis Date    Arthritis     CAD (coronary artery disease)     Dyslipidemia     Fracture     RT ANKLE    History of ventricular tachycardia     HTN (hypertension)     ICD (implantable cardiac defibrillator) in place     Ischemic cardiomyopathy     Neurologic cardiac syncope     Renal insufficiency     mild    S/P coronary artery stent placement

## 2021-07-27 ENCOUNTER — Inpatient Hospital Stay: Admit: 2021-07-27 | Discharge: 2021-07-27 | Payer: MEDICARE

## 2021-07-27 ENCOUNTER — Encounter: Admit: 2021-07-27 | Discharge: 2021-07-27 | Payer: MEDICARE

## 2021-07-27 ENCOUNTER — Inpatient Hospital Stay: Admit: 2021-07-27 | Discharge: 2021-07-26 | Payer: MEDICARE

## 2021-07-27 MED ADMIN — BUMETANIDE 0.25 MG/ML IJ SOLN [9308]: 0.5 mg/h | INTRAVENOUS | @ 18:00:00 | NDC 72205010201

## 2021-07-27 MED ADMIN — BUMETANIDE 0.25 MG/ML IJ SOLN [9308]: 2 mg | INTRAVENOUS | @ 17:00:00 | Stop: 2021-07-27 | NDC 72205010101

## 2021-07-27 MED ADMIN — HYDROCORTISONE 1 % TP CREA [3726]: TOPICAL | @ 11:00:00 | NDC 45802043803

## 2021-07-27 MED ADMIN — DAPAGLIFLOZIN PROPANEDIOL 10 MG PO TAB [320172]: 10 mg | ORAL | @ 21:00:00 | NDC 00310621030

## 2021-07-27 MED ADMIN — HYDROXYZINE HCL 25 MG PO TAB [3774]: 25 mg | ORAL | @ 11:00:00 | NDC 00904661761

## 2021-07-27 NOTE — ED Notes
Report to Luanna, RN

## 2021-07-27 NOTE — ED Notes
Report to [place], Therapist, sports.

## 2021-07-27 NOTE — H&P (View-Only)
Admission History and Physical Examination      Name: Logan Bright   MRN: [id number]     DOB: Jun 13, 1935      Age: 86 y.o.  Admission Date: 07/26/2021     LOS: 0 days     Date of Service: 07/26/2021                        Assessment/Plan:    Active Problems:    * No active hospital problems. 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, NYHA functional class III/ACC stage C  # Severe ischemic cardiomyopathy, extensive LAD pattern on echo and stress test  # Moderate mitral valve regurgitation  # Mild tricuspid valve vegetation  # Mild to moderate aortic insufficiency  # Essential hypertension  # Coronary artery disease (status post PCI in 2002, research)  # Permanent atrial fibrillation (not on anticoagulation due to symptomatic history)  # History of ventricular tachycardia (status post ICD in 2010 with an upgrade to CRT-D in 2020)  # Elevated troponins likely due to demand  #Bilateral lower extremity edema with weeping  -Patient previously discharged to SNF, has had difficulty with compliance with diet per daughter's report.  There is potential concern with medication compliance at facility but patient says he is compliant with medication.  Presented with hypervolemia and significant lower extremity swelling with weeping  -Weeping lower extremities bilateral up to the knee, skin is tense and tender to palpation, no obvious wounds, complaints of pain especially behind right knee, pain with ambulation  -BMP > 4300, elevated Trope  -Echo 06/19/2021 showed EF 20%, Left ventricle is severely dilated.  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  Unable to assess diastolic function at this time.  RV is probably dilated with preserved function.  Right-sided pacer leads are noted.  There is nonspecific mitral thickening with is probably MRI.  Mild TR. Mild to moderate AI.  Unable to assess IVC related pressures.  -Dry weight of 200-205, admission weight of 217  -PTA GDMT: Metoprolol, no ACE/ARB, spironolactone or SGLT2 due to kidney function  -Had appointment with PCP 6/27 who considered starting Farxiga  -Has not been anticoagulated due to history of severe/symptomatic hematuria  Plan:  > Consult heart failure team  > U/S bilateral lower extremities to rule out DVT  > IV Bumex 2 mg twice daily, strict I/o  > 2 L fluid restriction  > Consider interrogation of device, most recently interrogated 06/20/2021 with BiV paced at 99.5%.  > Continue PTA amiodarone and metoprolol  > Consider continuing PTA Farxiga pending her kidney function fluids last  > Trend troponin to peak  > Consult wound team for lower extremity weeping    #Dyspnea on exertion  #Orthopnea  -Chest x-ray shows mild cardiomegaly and mild pulmonary vascular congestion, mild left basilar scarring/atelectasis  -No supplemental O2 required, no trouble with breathing at rest, shortness of breath with exertion or lying flat  Plan:  > Continue to monitor fluid status and diurese as above    # Chronic kidney disease stage IIIb   # Elevated anion gap  -Creatinine 2.26 baseline creatinine about 1.6-2.0, however after recent admission to baseline suspected is likely 2-2 0.3  > Continue to monitor    # Hypothyroidism  > PTA Synthroid    # Gout  > PTA allopurinol 300 mg  nightly    FEN: No IV fluids due to acute failure exacerbation, cardiac diet, 2 L fluid restriction  DVT: Heparin, hold off on SCD due to weeping in legs  CODE STATUS: DNR FI  Dispo: Admit to medicine, evaluation of acute heart failure    Patient discussed with Dr. Fonnie Mu, DO  PGY1 PMR Prelim  On Voalte    Voice recognition software was used for this dictation.  Every attempt was made to edit but small errors may persist  _____________________________________________________________________________    Primary Care Physician: Rock Nephew  Verified    Chief Complaint: Lower extremity swelling and dyspnea on exertion  History of Present Illness:   Logan Bright is a 86 y.o. male, with a history of stage 3 CKD, CHF, CAD, HLD, HTN, who presents to the emergency department for SOB and lower extremity swelling and weeping with pain. Pt reports persistent SOB and bilateral LE edema. Pt has recently been admitted multiple times for CHF exacerbations and was recently started on Bumex which has not helped improve his symptoms. SOB worsens with exertion and laying supine. Pt denies O2 requirement at baseline.  No JVD and negative hepatojugular reflex.   ?  Pt additionally reports bilateral LE rash with associated weeping and erythema.  Pt has been having difficulty with ambulation secondary to pain and tenderness. Pt denies fever, chills, nausea, vomiting, headache, chest pain, abdominal pain, urinary symptoms, and diarrhea. Pt states he does not have any other medical concerns at this time.  Patient was most recently discharged to skilled nursing facility and states that he has been taking his medications as prescribed.  He does not remember what medications he is taking but says that he sees all doctors at [facility] and they should be up-to-date from appointment 2 days ago.  He was placed on fluid restriction diet states that he has had difficulty following.    Medical History:   Diagnosis Date   ? Arthritis    ? CAD (coronary artery disease)    ? Dyslipidemia    ? Fracture     RT ANKLE   ? History of ventricular tachycardia    ? HTN (hypertension)    ? ICD (implantable cardiac defibrillator) in place    ? Ischemic cardiomyopathy    ? Neurologic cardiac syncope    ? Renal insufficiency     mild   ? S/P coronary artery stent placement      Surgical History:   Procedure Laterality Date   ? EXTERNAL FIXATION APPLICATION ANKLE Right 11/13/2015    Performed by Azzie Glatter, MD at [facility] OR   ? EXTERNAL FIXATION REMOVAL RIGHT ANKLE Right 12/08/2015 Performed by Azzie Glatter, MD at [facility] OR   ? OPEN REDUCTION INTERNAL FIXATION RIGHT ANKLE Right 12/08/2015    Performed by Azzie Glatter, MD at [facility] OR   ? INSERTION LEAD LEFT VENTRICLE AT TIME OF INSERTION OF PACEMAKER/ IMPLANTABLE DEFIBRILLATOR Left 09/09/2018    Performed by Annamarie Dawley, MD at [facility] LAB   ? REMOVAL AND REPLACEMENT PERMANENT PACEMAKER GENERATOR  - MULTIPLE LEAD SYSTEM Left 09/09/2018    Performed by Annamarie Dawley, MD at [facility]   ? CARDIAC DEFIBRILLATOR PLACEMENT     ? HX HEART CATHETERIZATION     ? HX ICD PLACEMENT     ? HX PACEMAKER PLACEMENT       Family history reviewed; non-contributory  Social History  Tobacco Use   ? Smoking status: Never   ? Smokeless tobacco: Never   Vaping Use   ? Vaping Use: Never used   Substance and Sexual Activity   ? Alcohol use: Never   ? Drug use: Never             Immunizations (includes history and patient reported):   Immunization History   Administered Date(s) Administered   ? COVID-19 (PFIZER tris-sucrose) mRNA vacc, 30 mcg/0.3 mL (PF) 07/03/2020   ? COVID-19 (PFIZER), mRNA vacc, 30 mcg/0.3 mL (PF) 08/12/2019, 09/02/2019   ? Tdap Vaccine 11/11/2015           Allergies:  Chlorhexidine gluconate and Colchicine    Medications:  (Not in a hospital admission)    Review of Systems:  A 14 point review of systems was negative except for those things noted in HPI    Physical Exam:  Vital Signs: Last Filed In 24 Hours Vital Signs: 24 Hour Range   BP: 112/55 (06/29 2201)  Temp: 36.7 ?C (98.1 ?F) (06/29 1735)  Pulse: 60 (06/29 2201)  Respirations: 19 PER MINUTE (06/29 2201)  SpO2: 94 % (06/29 2130)  O2 Device: None (Room air) (06/29 1826)  SpO2 Pulse: 57 (06/29 2201)  Height: 182.9 cm (6') (06/29 1733) BP: (109-130)/(55-72)   Temp:  [36.7 ?C (98.1 ?F)]   Pulse:  [60-77]   Respirations:  [13 PER MINUTE-21 PER MINUTE]   SpO2:  [93 %-100 %]   O2 Device: None (Room air)          General:  Alert, cooperative, no distress, appears stated age  Head:  Normocephalic, without obvious abnormality, atraumatic  Lungs:  Clear to auscultation bilaterally  Heart:    Regular rate and rhythm, S1, S2 normal, no murmur, click rub or gallop  Abdomen:  Soft, non-tender.  Bowel sounds normal.  No masses.  No organomegaly.  Extremities: Significant lower extremity edema with weeping  Skin:   Skin dry, weeping lower extremity edema is with erythema, no asymmetrical warmth or concern for cellulitis  Neurologic: Neuro exam intact  Musculoskeletal: Muscle strength intact throughout  Psych: Abnormal memory, appropriate mood and mentation    Lab/Radiology/Other Diagnostic Tests:  24-hour labs:    Results for orders placed or performed during the hospital encounter of 07/26/21 (from the past 24 hour(s))   CBC AND DIFF    Collection Time: 07/26/21  7:20 PM   Result Value Ref Range    White Blood Cells 7.3 4.5 - 11.0 K/UL    RBC 4.48 4.4 - 5.5 Euan Wandler/UL    Hemoglobin 14.7 13.5 - 16.5 GM/DL    Hematocrit 16.1 40 - 50 %    MCV 97.1 80 - 100 FL    MCH 32.9 26 - 34 PG    MCHC 33.9 32.0 - 36.0 G/DL    RDW 09.6 (H) 11 - 15 %    Platelet Count 168 150 - 400 K/UL    MPV 8.1 7 - 11 FL    Neutrophils 70 41 - 77 %    Lymphocytes 6 (L) 24 - 44 %    Monocytes 8 4 - 12 %    Eosinophils 16 (H) 0 - 5 %    Basophils 0 0 - 2 %    Absolute Neutrophil Count 5.08 1.8 - 7.0 K/UL    Absolute Lymph Count 0.45 (L) 1.0 - 4.8 K/UL    Absolute Monocyte Count 0.61 0 - 0.80 K/UL    Absolute  Eosinophil Count 1.13 (H) 0 - 0.45 K/UL    Absolute Basophil Count 0.02 0 - 0.20 K/UL    MDW (Monocyte Distribution Width) 19.3 <20.7   COMPREHENSIVE METABOLIC PANEL    Collection Time: 07/26/21  7:20 PM   Result Value Ref Range    Sodium 135 (L) 137 - 147 MMOL/L    Potassium 4.1 3.5 - 5.1 MMOL/L    Chloride 99 98 - 110 MMOL/L    Glucose 106 (H) 70 - 100 MG/DL    Blood Urea Nitrogen 44 (H) 7 - 25 MG/DL    Creatinine 1.61 (H) 0.4 - 1.24 MG/DL    Calcium 7.9 (L) 8.5 - 10.6 MG/DL    Total Protein 6.5 6.0 - 8.0 G/DL    Total Bilirubin 0.4 0.3 - 1.2 MG/DL Albumin 3.4 (L) 3.5 - 5.0 G/DL    Alk Phosphatase 096 25 - 110 U/L    AST (SGOT) 16 7 - 40 U/L    CO2 22 21 - 30 MMOL/L    ALT (SGPT) 10 7 - 56 U/L    Anion Gap 14 (H) 3 - 12    eGFR 28 (L) >60 mL/min   MAGNESIUM    Collection Time: 07/26/21  7:20 PM   Result Value Ref Range    Magnesium 2.1 1.6 - 2.6 mg/dL   NT-PRO-BNP    Collection Time: 07/26/21  7:20 PM   Result Value Ref Range    NT-Pro-BNP 4,348.0 (H) <450 pg/mL   HIGH SENSITIVITY TROPONIN I 0 HOUR    Collection Time: 07/26/21  7:20 PM   Result Value Ref Range    hs Troponin I 0 Hour 35 (H) <20 ng/L   POC LACTATE    Collection Time: 07/26/21  7:21 PM   Result Value Ref Range    LACTIC ACID POC 1.7 0.5 - 2.0 MMOL/L   HIGH SENSITIVITY TROPONIN I 2 HOUR    Collection Time: 07/26/21  9:08 PM   Result Value Ref Range    hs Troponin I 2 Hour 33 (H) <20 ng/L     Glucose: (!) 106 (07/26/21 1920)  Pertinent radiology reviewed.    Sharlyne Pacas, DO  Pager

## 2021-07-28 MED ADMIN — CLOTRIMAZOLE 1 % TP CREA [1767]: TOPICAL | @ 18:00:00 | NDC 00904782236

## 2021-07-28 MED ADMIN — METOLAZONE 5 MG PO TAB [10588]: 2.5 mg | ORAL | @ 18:00:00 | Stop: 2021-07-28 | NDC 51079002401

## 2021-07-28 MED ADMIN — POTASSIUM CHLORIDE 10 MEQ PO TBTQ [35942]: 30 meq | ORAL | @ 18:00:00 | Stop: 2021-07-28 | NDC 00245531789

## 2021-07-28 MED ADMIN — POTASSIUM CHLORIDE 20 MEQ PO TBTQ [35943]: 30 meq | ORAL | @ 18:00:00 | Stop: 2021-07-28 | NDC 00832532511

## 2021-07-28 MED ADMIN — HYDROCORTISONE 1 % TP CREA [3726]: TOPICAL | @ 18:00:00 | NDC 45802043803

## 2021-07-28 MED ADMIN — TRIAMCINOLONE ACETONIDE 0.1 % TP OINT [8118]: TOPICAL | @ 18:00:00 | NDC 00168000615

## 2021-07-28 MED ADMIN — POTASSIUM CHLORIDE 20 MEQ PO TBTQ [35943]: 40 meq | ORAL | @ 02:00:00 | Stop: 2021-07-28 | NDC 00832532511

## 2021-07-28 MED ADMIN — HYDROXYZINE HCL 25 MG PO TAB [3774]: 25 mg | ORAL | @ 18:00:00 | NDC 00904661761

## 2021-07-28 MED ADMIN — BUMETANIDE 0.25 MG/ML IJ SOLN [9308]: 0.5 mg/h | INTRAVENOUS | @ 07:00:00 | NDC 72205010201

## 2021-07-28 MED ADMIN — DAPAGLIFLOZIN PROPANEDIOL 10 MG PO TAB [320172]: 10 mg | ORAL | @ 14:00:00 | NDC 00310621030

## 2021-07-29 MED ADMIN — BUMETANIDE 0.25 MG/ML IJ SOLN [9308]: 0.5 mg/h | INTRAVENOUS | @ 07:00:00 | NDC 72205010201

## 2021-07-29 MED ADMIN — DAPAGLIFLOZIN PROPANEDIOL 10 MG PO TAB [320172]: 10 mg | ORAL | @ 17:00:00 | NDC 00310621030

## 2021-07-29 MED ADMIN — POTASSIUM CHLORIDE 20 MEQ PO TBTQ [35943]: 40 meq | ORAL | @ 15:00:00 | Stop: 2021-07-29 | NDC 00832532511

## 2021-07-30 MED ADMIN — DAPAGLIFLOZIN PROPANEDIOL 10 MG PO TAB [320172]: 10 mg | ORAL | @ 16:00:00 | Stop: 2021-07-30 | NDC 00310621030

## 2021-07-30 MED ADMIN — BUMETANIDE 0.25 MG/ML IJ SOLN [9308]: 0.5 mg/h | INTRAVENOUS | @ 07:00:00 | Stop: 2021-07-30 | NDC 65219057201

## 2021-07-30 MED ADMIN — ALLOPURINOL 300 MG PO TAB [311]: 150 mg | ORAL | @ 01:00:00 | NDC 00904657261

## 2021-07-30 MED ADMIN — POTASSIUM CHLORIDE 20 MEQ PO TBTQ [35943]: 60 meq | ORAL | @ 14:00:00 | Stop: 2021-07-30 | NDC 00832532511

## 2021-07-30 MED ADMIN — BUMETANIDE 2 MG PO TAB [9311]: 3 mg | ORAL | @ 16:00:00 | Stop: 2021-07-30 | NDC 50268013211

## 2021-07-30 MED ADMIN — CLOTRIMAZOLE 1 % TP CREA [1767]: TOPICAL | @ 16:00:00 | Stop: 2021-07-30 | NDC 51672127501

## 2021-07-30 MED ADMIN — POTASSIUM CHLORIDE 20 MEQ PO TBTQ [35943]: 40 meq | ORAL | @ 01:00:00 | Stop: 2021-07-30 | NDC 00832532511

## 2021-08-01 ENCOUNTER — Encounter: Admit: 2021-08-01 | Discharge: 2021-08-01 | Payer: MEDICARE

## 2021-08-01 NOTE — Telephone Encounter
Hospital Discharge Follow Up      Reached Patient: No, transferred to [facility]  Patient Date of Birth: 1935-07-22     Admission Information:     Hospital Name: [facility] (includes main campus, [place], [place], [place], [place])  Admission Date: 07/26/2021    Discharge Date: 07/30/2021   Admission Diagnosis: Heart failure systolic and diastolic acute on chronic  Discharge Diagnosis: Chronic HFrEF, Automatic implantable cardioverter defibrillator in situ, cognitive dysfunction, Acute on chronic combined systolic and diastolic ACC/AHA stage C congestive heart failure, Coronary artery disease involving native coronary artery of native heart without angina pectoris, Acute kidney injury superimposed on CKD  Has there been a discharge within the last 30 days? Yes  If yes, reason: 07/12/2021 Acute on chronic HFrEF  Hospital Services: Unplanned  Today's call is 1 (business) days post discharge    Meds reviewed and reconciled? Yes    Current Outpatient Medications   Medication Instructions   ? acetaminophen (TYLENOL EXTRA STRENGTH) 500 mg, Oral, EVERY  6 HOURS PRN, Max of 4,000 mg of acetaminophen in 24 hours.   ? allopurinoL (ZYLOPRIM) 150 mg, Oral, AT BEDTIME DAILY, Take with food.   ? amiodarone (CORDARONE) 200 mg, Oral, DAILY, Take with food.   ? bumetanide (BUMEX) 3 mg, Oral, TWICE DAILY   ? calcium carbonate (TUMS E-X) 300 mg (750 mg) chewable tablet 2 tablets, Oral, AT BEDTIME DAILY   ? CHOLEcalciferoL (vitamin D3) (VITAMIN D3) 5,000 Units, Oral, DAILY   ? clotrimazole (LOTRIMIN) 1 % topical cream Topical, TWICE DAILY, Apply to affect areas of feet for tinea pedis   ? dapagliflozin propanediol (FARXIGA) 10 mg, Oral, DAILY   ? docusate (COLACE) 100 mg, Oral, TWICE DAILY   ? finasteride (PROSCAR) 5 mg, Oral, DAILY   ? hydrocortisone (CORTIZONE-10) 1 % topical cream Topical, TWICE DAILY, Apply to face/groin/underarms   ? levothyroxine (SYNTHROID) 50 mcg tablet TAKE 1 TABLET BY MOUTH EVERY DAY 30 MINUTES BEFORE BREAKFAST   ? menthol/camphor (BIOFREEZE TP) Topical, THREE TIMES DAILY PRN   ? metoprolol succinate XL (TOPROL XL) 12.5 mg, Oral, DAILY, HOLD IF SBP < 100   ? ondansetron HCL (ZOFRAN) 4 mg, Oral, EVERY  6 HOURS PRN   ? polyethylene glycol 3350 (MIRALAX) 17 g, Oral, DAILY  PRN   ? potassium chloride SR (KLOR-CON M20) 20 mEq tablet 20 mEq, Oral, DAILY, Take with a meal and a full glass of water.   ? senna/docusate (SENOKOT-S) 8.6/50 mg tablet 1 tablet, Oral, DAILY  PRN   ? tamsulosin (FLOMAX) 0.4 mg, Oral, DAILY AFTER BREAKFAST, Do not crush, chew or open capsules. Take 30 minutes following the same meal each day.   ? thiamine mononitrate (vit B1) 100 mg, Oral, DAILY   ? triamcinolone acetonide (KENALOG) 0.1 % topical ointment Topical, TWICE DAILY, Use for the rest of your body besides face/groin/underarms      Scheduling Follow-up Appointment   Upcoming appointments:   Future Appointments   Date Time Provider Department Center   08/03/2021  8:00 AM Doran Durand, MD [facility]   01/15/2022  1:45 PM Sula Soda, MD [facility]     Does the patient require 7 day follow up appointment? Yes  but patient is in a long term care facility  Hospital Follow-Up scheduled with PCP? No, no appointments made  When was patient?s last PCP visit: Visit date not found   PCP primary location: [facility]  Specialist  appointment scheduled? Yes, with Nephrology 08/03/2021; Vaughan Sine 01/15/2022    MyChart message sent? Active in MyChart. No message sent.   Artera text sent? No    Yancey Flemings, RN

## 2021-08-03 ENCOUNTER — Encounter: Admit: 2021-08-03 | Discharge: 2021-08-03 | Payer: MEDICARE

## 2021-08-03 ENCOUNTER — Ambulatory Visit: Admit: 2021-08-03 | Discharge: 2021-08-04 | Payer: MEDICARE

## 2021-08-03 VITALS — BP 115/60 | HR 76 | Wt 206.0 lb

## 2021-08-03 VITALS — BP 98/48

## 2021-08-03 DIAGNOSIS — I10 Essential (primary) hypertension: Secondary | ICD-10-CM

## 2021-08-03 DIAGNOSIS — Z95810 Presence of automatic (implantable) cardiac defibrillator: Secondary | ICD-10-CM

## 2021-08-03 DIAGNOSIS — T148XXA Other injury of unspecified body region, initial encounter: Secondary | ICD-10-CM

## 2021-08-03 DIAGNOSIS — E785 Hyperlipidemia, unspecified: Secondary | ICD-10-CM

## 2021-08-03 DIAGNOSIS — Z955 Presence of coronary angioplasty implant and graft: Secondary | ICD-10-CM

## 2021-08-03 DIAGNOSIS — I2510 Atherosclerotic heart disease of native coronary artery without angina pectoris: Secondary | ICD-10-CM

## 2021-08-03 DIAGNOSIS — I255 Ischemic cardiomyopathy: Secondary | ICD-10-CM

## 2021-08-03 DIAGNOSIS — M1990 Unspecified osteoarthritis, unspecified site: Secondary | ICD-10-CM

## 2021-08-03 DIAGNOSIS — N289 Disorder of kidney and ureter, unspecified: Secondary | ICD-10-CM

## 2021-08-03 DIAGNOSIS — Z8679 Personal history of other diseases of the circulatory system: Secondary | ICD-10-CM

## 2021-08-03 DIAGNOSIS — R55 Syncope and collapse: Secondary | ICD-10-CM

## 2021-08-03 DIAGNOSIS — N184 Chronic kidney disease, stage 4 (severe): Principal | ICD-10-CM

## 2021-08-03 MED ORDER — BUMETANIDE 1 MG PO TAB
ORAL_TABLET | 3 refills | Status: AC
Start: 2021-08-03 — End: ?

## 2021-08-03 NOTE — Progress Notes
History from review of  prior records  and from  speaking to the patient .  History        CC: Evaluation of chronic kidney disease        HPI: Logan Bright is a 86 y.o. male with past medical history of hypertension, CKD and heart failure.  He was first seen by nephrology in April 2022.  At the time he was admitted on account of acute kidney injury from obstruction.    The last couple of months has been challenging for the patient with multiple hospital admissions.  In May 2023 he was admitted on account of volume overload due to heart failure with recurrent admissions for the same complaint twice in June 2023.  He was last discharged on 08/26/2021.  He lives at a SNF , [facility].  He experiences SOB with minimal exertion and sometimes even at rest. When he walks he has to slow down/stop to catch his breath when walking.  He reports lower extremity swelling.  He pays close attention to his salt intake.  He is on a 2L fluid restriction .   He is thirsty all the time.        Past Medical History   Medical History:   Diagnosis Date   ? Arthritis    ? CAD (coronary artery disease)    ? Dyslipidemia    ? Fracture     RT ANKLE   ? History of ventricular tachycardia    ? HTN (hypertension)    ? ICD (implantable cardiac defibrillator) in place    ? Ischemic cardiomyopathy    ? Neurologic cardiac syncope    ? Renal insufficiency     mild   ? S/P coronary artery stent placement         Family History  Family History   Problem Relation Age of Onset   ? Cancer Mother    ? Gout Mother    ? Cancer Sister    ? Diabetes Sister    ? Heart Attack Sister    ? Hypertension Sister    ? Gout Sister    ? Arthritis-osteo Sister    ? Stroke Sister    ? Thyroid Disease Sister    ? Migraines Sister         Social History  Social History     Socioeconomic History   ? Marital status: Widowed   ? Number of children: 2   Tobacco Use   ? Smoking status: Never   ? Smokeless tobacco: Never   Vaping Use   ? Vaping Use: Never used Substance and Sexual Activity   ? Alcohol use: Never   ? Drug use: Never        Medication    HOME MEDS  ? acetaminophen (TYLENOL EXTRA STRENGTH) 500 mg tablet Take one tablet by mouth every 6 hours as needed. Max of 4,000 mg of acetaminophen in 24 hours.   ? allopurinoL (ZYLOPRIM) 300 mg tablet Take one-half tablet by mouth at bedtime daily. Take with food. (Patient taking differently: Take one tablet by mouth at bedtime daily. Take with food.)   ? amiodarone (CORDARONE) 200 mg tablet Take one tablet by mouth daily. Take with food.   ? bumetanide (BUMEX) 1 mg tablet 4mg   In the morning 3mg  in the evening   ? calcium carbonate (TUMS E-X) 300 mg (750 mg) chewable tablet Chew two tablets by mouth at bedtime daily.   ? CHOLEcalciferoL (vitamin D3) (  VITAMIN D-3) 5000 unit tablet Take one tablet by mouth daily.   ? clotrimazole (LOTRIMIN) 1 % topical cream Apply  topically to affected area twice daily. Apply to affect areas of feet for tinea pedis   ? dapagliflozin propanediol (FARXIGA) 10 mg tablet Take one tablet by mouth daily.   ? docusate (COLACE) 100 mg capsule Take one capsule by mouth twice daily.   ? finasteride (PROSCAR) 5 mg tablet Take one tablet by mouth daily.   ? hydrocortisone (CORTIZONE-10) 1 % topical cream Apply  topically to affected area twice daily. Apply to face/groin/underarms   ? levothyroxine (SYNTHROID) 50 mcg tablet TAKE 1 TABLET BY MOUTH EVERY DAY 30 MINUTES BEFORE BREAKFAST   ? menthol/camphor (BIOFREEZE TP) Apply  topically to affected area three times daily as needed (low back pain).   ? metoprolol XL (TOPROL XL) 25 mg extended release tablet Take one-half tablet by mouth daily. HOLD IF SBP < 100   ? ondansetron HCL (ZOFRAN) 4 mg tablet Take one tablet by mouth every 6 hours as needed for Nausea or Vomiting.   ? polyethylene glycol 3350 (MIRALAX) 17 g packet Take one packet by mouth daily as needed (Constipation - Second Line).   ? potassium chloride SR (KLOR-CON M20) 20 mEq tablet Take one tablet by mouth daily. Take with a meal and a full glass of water.   ? senna/docusate (SENOKOT-S) 8.6/50 mg tablet Take one tablet by mouth daily as needed (Constipation - First Line).   ? tamsulosin (FLOMAX) 0.4 mg capsule Take one capsule by mouth daily after breakfast. Do not crush, chew or open capsules. Take 30 minutes following the same meal each day.   ? thiamine (VITAMIN B-1) 100 mg tablet Take one tablet by mouth daily.   ? triamcinolone acetonide (KENALOG) 0.1 % topical ointment Apply  topically to affected area twice daily. Use for the rest of your body besides face/groin/underarms          Review of Systems  Constitutional: negative  Eyes: negative  Ears, nose, mouth, throat, and face: negative  Respiratory: negative  Cardiovascular: negative  Gastrointestinal: negative  Genitourinary:negative  Integument/breast: negative  Hematologic/lymphatic: negative  Musculoskeletal:negative  Neurological: negative  Endocrine: negative      Physical Exam        Vitals:    08/03/21 0749 08/03/21 0752   BP: 115/60 (P) 98/48   BP Source: Arm, Left Upper (P) Arm, Left Upper   Pulse: 76    PainSc: Zero    Weight: 93.4 kg (206 lb)      Body mass index is 27.94 kg/m?[name]     Wt Readings from Last 10 Encounters:   08/03/21 93.4 kg (206 lb)   07/30/21 91 kg (200 lb 9.9 oz)   07/24/21 98.1 kg (216 lb 3.2 oz)   07/18/21 91.5 kg (201 lb 12.8 oz)   06/27/21 94.3 kg (208 lb)   06/23/21 93.5 kg (206 lb 2.1 oz)   02/06/21 92.6 kg (204 lb 3.2 oz)   07/03/20 83.4 kg (183 lb 14.4 oz)   06/28/20 86.2 kg (190 lb)   06/02/20 84 kg (185 lb 3.2 oz)     BP Readings from Last 5 Encounters:   08/03/21 (P) 98/48   07/30/21 106/57   07/24/21 104/65   07/18/21 117/58   06/27/21 120/78     Gen: Alert and Oriented, No Acute Distress   HEENT: Sclera normal; MMM  CV:  S1 and S2 normal, no rubs, murmurs or  gallops   Pulm: Clear to Auscultation bilateral   GI: BS+ x4, non-tender to palpation  Neuro: Grossly normal, moving all extremities, speech intact  Ext: no edema, clubbing or cyanosis   Skin: no rash     Assessment and Plan        Logan Bright is a 86 y.o. male     -Chronic kidney disease stage IV: At his age it is expected that he will have some age-related decline in his kidney function.  In addition he has had multiple episodes of AKI including obstructive AKI in April 2022.  More recently he has had multiple admissions due to heart failure.  He has severely reduced EF of 20% and thus progressive CKD would be exacerbated by chronic heart failure as well.  No proteinuria on recent assessment and the suggest a largely tubulointerstitial or vascular process rather than a primarily glomerular process accounting for CKD. We discussed the progression of CKD and dialysis. Based on the trajectory of his sCr , we will consider referral for CKD education at the next visit.      -Volume overload: It is hard to gauge his estimated dry weight since his measured weight fluctuates. He has gained 2.3Kg since he was discharged on 07-30-2021.  I have increased bumetanide to 4mg  am and 3mg  pm. Repeat BMP in 2 weeks.      - Hypertension:I discussed blood pressure parameters including holding parameters for hypotension.  Medication changes as outlined below.    Doran Durand, MD      Patient Instructions   Change bumetanide dose to   4mg   In the morning; 3mg  in the evening.    Change tamsulosin dosing to be given at bedtime.    Monitor weights and let us know if you gain more than 2Kg       Hold your blood pressure/ water pill medications  when the top number is below .  Let us know if this happens frequently.  Keep to a low-salt diet.    The following are some measures which may help to slow down the progression of kidney disease:    Avoid medications known as  NSAIDs which  include Advil, Motrin,Aleve, ibuprofen, naproxen.    Maintain adequate fluid intake.    Acute illnesses like the flu and COVID which may end up in hospitalization may impact on  kidney function and may cause the kidneys to fail.    It is important to keep a up with your regular vaccinations for these diseases and for the pneumonia vaccine as well.    If you develop diarrhea or vomiting and unable to keep up with your fluid intake, please report to the nearest ED since it may affect blood pressure and affect the blood flow to the kidney    Some medications used in patients with kidney disease including diuretics [water pill], some blood pressure medications [ACE inhibitors and ARB] like lisinopril, losartan may need to be held if you develop severe diarrhea or vomiting.  Under these conditions these medications may worsen kidney function.    Visit the following web site for information on diet in patients with kidney disease:  https://www.kidney.org/nutrition                  RTC 1 month

## 2021-08-07 ENCOUNTER — Encounter: Admit: 2021-08-07 | Discharge: 2021-08-07 | Payer: MEDICARE

## 2021-08-08 ENCOUNTER — Encounter: Admit: 2021-08-08 | Discharge: 2021-08-08 | Payer: MEDICARE

## 2021-08-08 NOTE — Telephone Encounter
Per Dr. Kirby Funk VO, increase bumex to 5mg  BID, repeat BMP next week.  Spoke with Caryl Pina at [facility] and provided order for increase in bumex with repeat BMP next Wednesday, 7/19. Provided with my direct call back number so they can notify if no change or increase in weight or any additional concerns.

## 2021-08-08 NOTE — Telephone Encounter
Rec'd VM from [place] at [place] returning my call. Returned call at [phone]. Spoke with Vaughan Basta, she reports pt weight this AM is 207.5lb. Was 206lb on 7/7 at [facility] with Dr. Kirby Funk and 200lbs on d/c from hospital on 7/3.   Confirmed with Vaughan Basta that pt is receiving Bumex 4mg  in the AM and 3mg  in the PM. Advised will discuss with Dr. Kirby Funk and get back to them with any additional orders. Per message from daughter yesterday pt is adament he does not want to be in the hospital again.

## 2021-08-08 NOTE — Telephone Encounter
Attempted to contact pt facility to f/u on pt weights as had rec'd message from pt daughter re: weight gain, was 207lbs (prev 200lbs when discharged from hospital), was on hold for 5 minutes. Receptionist took my number to pass on to the nurses to give me a call back.

## 2021-08-09 ENCOUNTER — Encounter: Admit: 2021-08-09 | Discharge: 2021-08-09 | Payer: MEDICARE

## 2021-08-09 NOTE — Telephone Encounter
Voalte message to provider and provider response:    Trevin Gartrell MRN [id number] - his nurse at his facility left me a mesage that his weight today is 211.5lb (yest was 207.5). He is not having any SOA, no weeping, 1+ pitting edema. We did just give them orders yesterday to increase the bumex to 5mg  BID but they wanted to make you aware about his weight today.Sent 13:26  Read 13:26    Sure. Thank you   Kathrine Haddock - Nephrologist  13:27

## 2021-08-10 ENCOUNTER — Encounter: Admit: 2021-08-10 | Discharge: 2021-08-10 | Payer: MEDICARE

## 2021-08-10 NOTE — Telephone Encounter
Received call from [facility]) [phone] reporting pts weight yesterday was 211.5 lbs and was 215 lbs today.     Patient Status  patient is an established patient with [facility].        Signs and Symptoms  2+ edema in legs   No worsening SOA        Medication Review  bumex 4 mg AM and 3 mg PM  farxiga 10 mg daily   Metoprolol 12.5 mg daily         Fluid/sodium Intake  Patient is drinking under  64 oz of fluid daily

## 2021-08-11 ENCOUNTER — Encounter: Admit: 2021-08-11 | Discharge: 2021-08-11 | Payer: MEDICARE

## 2021-08-16 ENCOUNTER — Encounter: Admit: 2021-08-16 | Discharge: 2021-08-16 | Payer: MEDICARE

## 2021-08-20 ENCOUNTER — Encounter: Admit: 2021-08-20 | Discharge: 2021-08-20 | Payer: MEDICARE

## 2021-08-21 ENCOUNTER — Ambulatory Visit: Admit: 2021-08-21 | Discharge: 2021-08-21 | Payer: MEDICARE

## 2021-08-21 ENCOUNTER — Encounter: Admit: 2021-08-21 | Discharge: 2021-08-21 | Payer: MEDICARE

## 2021-08-21 DIAGNOSIS — Z8679 Personal history of other diseases of the circulatory system: Secondary | ICD-10-CM

## 2021-08-21 DIAGNOSIS — Z955 Presence of coronary angioplasty implant and graft: Secondary | ICD-10-CM

## 2021-08-21 DIAGNOSIS — I480 Paroxysmal atrial fibrillation: Secondary | ICD-10-CM

## 2021-08-21 DIAGNOSIS — T148XXA Other injury of unspecified body region, initial encounter: Secondary | ICD-10-CM

## 2021-08-21 DIAGNOSIS — I2510 Atherosclerotic heart disease of native coronary artery without angina pectoris: Secondary | ICD-10-CM

## 2021-08-21 DIAGNOSIS — Z95810 Presence of automatic (implantable) cardiac defibrillator: Secondary | ICD-10-CM

## 2021-08-21 DIAGNOSIS — N1832 Stage 3b chronic kidney disease (CKD) (HCC): Secondary | ICD-10-CM

## 2021-08-21 DIAGNOSIS — I5022 Chronic systolic (congestive) heart failure: Secondary | ICD-10-CM

## 2021-08-21 DIAGNOSIS — R55 Syncope and collapse: Secondary | ICD-10-CM

## 2021-08-21 DIAGNOSIS — N289 Disorder of kidney and ureter, unspecified: Secondary | ICD-10-CM

## 2021-08-21 DIAGNOSIS — E785 Hyperlipidemia, unspecified: Secondary | ICD-10-CM

## 2021-08-21 DIAGNOSIS — I255 Ischemic cardiomyopathy: Secondary | ICD-10-CM

## 2021-08-21 DIAGNOSIS — M1990 Unspecified osteoarthritis, unspecified site: Secondary | ICD-10-CM

## 2021-08-21 DIAGNOSIS — I10 Essential (primary) hypertension: Secondary | ICD-10-CM

## 2021-08-21 LAB — POC BASIC METABOLIC PANEL (BMP)
ANION GAP, POC: 14 — ABNORMAL HIGH (ref 3–12)
BUN, POC: 39 mg/dL — ABNORMAL HIGH (ref 7–25)
CHLORIDE, POC: 99 MMOL/L (ref 98–110)
CO2, POC: 28 MMOL/L (ref 21–30)
CREATININE, POC: 2 mg/dL — ABNORMAL HIGH (ref 0.4–1.24)
GLUCOSE, POC: 99 mg/dL (ref 70–100)
IONIZED CALCIUM,POC: 0.9 MMOL/L — ABNORMAL LOW (ref 1.0–1.3)
POTASSIUM, POC: 3.4 MMOL/L — ABNORMAL LOW (ref 3.5–5.1)
SODIUM, POC: 137 MMOL/L (ref 137–147)

## 2021-08-21 MED ORDER — BUMETANIDE 2 MG PO TAB
4 mg | ORAL_TABLET | Freq: Two times a day (BID) | ORAL | 3 refills | Status: DC
Start: 2021-08-21 — End: 2021-08-21

## 2021-08-21 MED ORDER — POTASSIUM CHLORIDE 20 MEQ PO TBTQ
40 meq | ORAL_TABLET | Freq: Two times a day (BID) | ORAL | 3 refills | 30.00000 days | Status: DC
Start: 2021-08-21 — End: 2021-08-21

## 2021-08-21 MED ORDER — POTASSIUM CHLORIDE 20 MEQ PO TBTQ
40 meq | ORAL_TABLET | Freq: Two times a day (BID) | ORAL | 3 refills | 30.00000 days | Status: AC
Start: 2021-08-21 — End: ?

## 2021-08-21 MED ORDER — BUMETANIDE 2 MG PO TAB
4 mg | ORAL_TABLET | Freq: Two times a day (BID) | ORAL | 3 refills | Status: AC
Start: 2021-08-21 — End: ?

## 2021-08-21 NOTE — Progress Notes
Date of Service: 08/21/2021    Logan Bright is a 86 y.o. male.       HPI       Hospital discharge follow-up  Logan Bright  is a 52M combined HFrEF 20% due to ICM s/p MDT CRT-D 2020, CAD s/p 2 stents at [facility] in 2002 (details unknown), VT  s/p ICD 2010 (upgraded to CRT-D 2020) s/p Ablation X 2 in 2015 treated with amiodaorne, CKD IIIB with BL cr. ~ 1.6-2, PAFib not on AC due to hx of hematuria, hx of cryptogenic stroke in 2017,  gout, mild cognitive impairment, Hypothyroidism and elevated BMI. He follows with Dr. Barry Dienes, and has been seen previously by Shon Baton, NP.     He was admitted from SNF with hypervolemia/weight gain  In May and twice in June 2023. Daughter reports significant non-compliance with diet, stating he doesn't understand what he should and should not be eating although he lives in a facility. He has been IV diuresed with bumex. Evaluation for furoscix was initiated prior to most recent d/c on 07/30/21, pending patient assistance application.    Pt presents today reporting that he has been feeling quite well and denies shortness of breath while walking down the [place].  His daughter states that he has made significant dietary changes including restricting his snacks and sodas.  He has better energy level and is mentally more alert.  His weight at the facility is recorded at 201 pounds following discharge which increased to 213 pounds and is currently down to 206 pounds.  He increased his Bumex dose up to 5 mg twice daily and is currently taking 4 mg twice daily.  He also increase his potassium dose to 40 mill equivalents once daily at the facility.  I am not sure who is making his diuretic changes at the facility but they need to communicate with the heart failure clinic when these changes are made.  He denies any dizziness, chest pain or falls.  He states that his swelling is improved.  He was previously treated for cellulitis in his legs and his daughter states that the redness in his legs has improved but has not gone away.  The patient denies any tenderness in his legs currently.       Vitals:    08/21/21 1507   BP: 110/60   BP Source: Arm, Right Upper   Pulse: 87   SpO2: 97%   O2 Device: None (Room air)   PainSc: Zero   Weight: 93.6 kg (206 lb 6.4 oz)   Height: 182.9 cm (6')     Body mass index is 27.99 kg/m?[name]   Wt Readings from Last 3 Encounters:   08/21/21 93.6 kg (206 lb 6.4 oz)   08/03/21 93.4 kg (206 lb)   07/30/21 91 kg (200 lb 9.9 oz)        Past Medical History  Patient Active Problem List    Diagnosis Date Noted   ? Fluid overload 06/18/2021   ? Hypovitaminosis D 07/03/2020   ? Colitis 05/26/2020   ? Hematuria 05/26/2020   ? Acute kidney injury superimposed on CKD (HCC) 05/26/2020   ? Coronary artery disease involving native coronary artery of native heart without angina pectoris 05/23/2020   ? Diverticulitis 05/22/2020   ? Acute on chronic combined systolic and diastolic ACC/AHA stage C congestive heart failure (HCC) 03/30/2020   ? Cognitive impairment 03/30/2020   ? Cellulitis of scrotum 03/30/2020   ? Stage 3b chronic kidney  disease (CKD) (HCC) 03/30/2020   ? Cognitive dysfunction 03/09/2020   ? Shortness of breath 10/29/2019   ? Heart failure exacerbated by sotalol (HCC) 10/27/2019   ? Heart failure (HCC) 07/01/2019   ? History of polycythemia 12/14/2018   ? Hyponatremia 09/04/2018   ? Hypothyroidism 09/04/2018   ? Paroxysmal atrial fibrillation (HCC) 04/07/2018   ? Cryptogenic stroke [facility]) 02/06/2016     2017 - Eye doctor told him he'd had an old stroke after doing an eye exam    08/06/16 Carotid Ultrasound [facility]): Bilateral antegrade vertebral artery flow. No hemodynamically significant common or internal carotid artery stenosis.     ? Closed trimalleolar fracture of right ankle 11/12/2015   ? Debility 06/10/2013   ? Recurrent ventricular tachycardia w/ 2 VT ablations 04/2013 05/11/2013     Sustained VT w/ multiple ICD tachypacing and Shocks, failed amiodaron   05/05/13 & 05/12/13  VT ablations [facility] w/ impella support 4/15, Home off Coreg and losartan d/t post procedure hematoma, hypotension     ? AKI (acute kidney injury) (HCC) 04/16/2013   ? 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 Ermelinda Das.        ? Ischemic cardiomyopathy      6/02 - infarct 2 stents .  [facility]            4/04 - Surveillance stress test at [facility]: reportedly negative.  4/05 - Stress thallium study: EF 30%, LAD and RCA defects, elevated lung uptake.- Echo doppler: EF 25%, amt& inf hypokinesis,  apical akinesis, poss  thrombus, mild valvular Regurg       5/06 - Stress thallium: EF 22%, no signficant interval change.  08/2011 - Dobut echo.  EF 10-15%. Antero-apical akinesis/dyskinesis.  No ischemia.  04/2013 RegadenThall EF 23% Extensive Anterior infarct, no ischemia, unchanged from prior     ? Chronic HFrEF (heart failure with reduced ejection fraction) (HCC)       2003 - Status post ICD implant.    4/05 - EF 25-30% per stress thallium and echo doppler.  08/2011 - Echo EF 10-15%     ? Neurologic cardiac syncope        3/05 - Developed syncope while at restaurant, taken to ED.  Symptoms of diaphoresis                    and lightheadedness.          4/05 - Head up tilt study: positive for inducing hypotension 72/49. Produced symptoms                    c/w previous syncopal episode.     ? HTN (hypertension)                 2002 - Diagnosis established.     ? Dyslipidemia         09/26/2008  03/14/2009  02/26/2011  04/17/2012  03/31/2013   LDL 107 (H) 141 (H) 80 108 (H) 104 (H)   04/2013: Refuses Statins understands that they reduce recurrent CV events, still declines.      ? Gout          ROS   * see HPI      Physical Exam    Alert and appropriate, difficulty answering questions, limited by dementia  JVD 7-8 cm  at 90 degrees with (-) HJR  No abdominal distention  1-2+ bil LE edema, bilateral redness R > L  Breathing comfortably without wheezing, rales or rhonchi  Auscultated heart with normal S1S2, regular rhythm, no obvious murmur   Able to walk into clinic unassisted       Cardiovascular Studies  Echo 06/19/21  1. Severely impaired left ventricular systolic function with an EF of 20%.  The left ventricle is severely dilated, the end-diastolic dimension was 6.5 cm, however the end-diastolic volume was 317 mL with an index of 146 mL.  Squared.  2. 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  3. Unable to assess diastolic function.  4. The RV is probably dilated with preserved function.  5. Right-sided pacer leads were noted.  6. There is nonspecific mitral thickening with probably moderate MR.  7. Mild TR.  8. Aortic valve sclerosis without stenosis.  Mild to moderate AI.[name]  9. Unable to calculate a peak pulmonary pressure.  10. The IVC was consistent with an elevated right atrial pressure.  11. Aortic root was mildly dilated, the ascending aorta was moderately dilated.  12. No pericardial effusion.    ?  Prior study was obtained on 05/24/2020.  It demonstrated an EF of 20%, the end-diastolic dimension was 7.2 cm, the end-diastolic volume was 193 mL with an index of 98 mL/m?, the RV function was preserved, right-sided device leads were noted, there is nonspecific mitral valve thickening, there is aortic valve sclerosis without stenosis, Doppler interrogation was not performed, the aortic root was dilated, the IVC is consistent with a normal central venous pressure, there is no pericardial effusion.  ?  The LV systolic function remains similar in both studies.  The akinetic areas on the prior study have a somewhat more aneurysmal change on the current study.  Otherwise the 2 studies are similar.    PYP 09/07/18:  This study is negative and not suggestive of ATTR cardiac amyloidosis.  A negative exam does not exclude AL cardiac amyloidosis.    MPI 04/14/18:  This study is markedly abnormal.  Extensive, severely reduced mid to apical uptake defects in all cardiac segments, partially fixed.  Suggestive of extensive prior myocardial injury with mild peri-infarct ischemia (in the mid segments).this is unchanged compared to prior ADAC study (04/21/2012).  Current summed stress score is 55 (51 on prior ADAC study). Dilated left ventricle with severely reduced left ventricular ejection fraction, EF of 18% (20% on prior study).  High risk prognostic indicators for annual cardiovascular mortality remain present. Nonischemic response to ECG.          Problems Addressed Today  Encounter Diagnoses   Name Primary?   ? Paroxysmal atrial fibrillation (HCC) Yes   ? Ischemic cardiomyopathy    ? Chronic HFrEF (heart failure with reduced ejection fraction) (HCC)    ? Stage 3b chronic kidney disease (CKD) (HCC)        Assessment and Plan     HFrEF related to ICM  - Last LVEF 20% from echo 06/19/21  - NYHA class III Stage C  - Dry weight: 195 pounds  - amyloid workup negative  - hypervolemic on exam today with 1- 2+ lower extremity edema to his knees  > PLAN:   -Continue increased dose of Bumex 4 mg twice daily  -Recommend weekly BMP to be drawn at his facility  -Recommend Ace wraps bilaterally  to be placed in the morning and taken off in the evening before bed  -Any and all diuretic changes need to be called to heart failure clinic    GDMT Current Dose Changes made at visit   BB Toprol XL 12.5 mg daily     ACEI/ARB/ARNI No ACE/ARB/ARNI due to renal function     Aldosterone antagonist No MRA due to renal function      SGLT2i Farxiga 10 mg daily      Hydralazine/Nitrate NA     Ivabradine NA    HRMT  CRT-D    Diuretics Bumex 4 mg BID  continue without change   Cardiac rehab NA as resides in a SNF    Candidacy for Adv HF therapies       Coronary artery disease  Hx of PCI in 2002 at [facility]   Details unknown  Denies anginal Symptoms  ?  Permanent Atrial Fibrillation  CHA2DS2VASC is elevated.   Not on OAC due to hx of hematuria.   -Currently on amiodarone 200 mg daily and Toprol XL 12.5 mg daily  Managed by Dr. Barry Dienes.   ?  Chronic Kidney Disease Stage IIIB  BL Cr. ~ 1.6-2 Suspect new baseline is closer to 2-2.3.  Last creat 2.27 from 07/30/21  POC creat today 2.0  ?  VT s/p ICD 2010 upgraded to CRT-D 2020  S/p VT ablation in 2015  Treated with Amidarone    Hypertension  BP today 110/60  Currently on GDMT as per above    Hypokalemia  POC potassium 3.4 today  > Increase potassium to 40 mg once twice daily from once daily       DM Type II   >  Lab Results   Component Value Date/Time    HGBA1C 6.6 (H) 03/30/2020 02:00 AM    HGBA1C 6.6 (H) 10/28/2019 04:36 AM    HGBA1C 6.6 (H) 03/31/2013 06:35 AM        Dyslipidemia  Not on STATINS due to patient refusal per chart review.        Lab Results   Component Value Date/Time    CHOL 81 03/30/2020 04:12 AM    TRIG 63 03/30/2020 04:12 AM    HDL 39 (L) 03/30/2020 04:12 AM    LDL 41 03/30/2020 04:12 AM    VLDL 13 03/30/2020 04:12 AM    NONHDLCHOL 42 03/30/2020 04:12 AM    CHOLHDLC 4 04/17/2012 12:00 AM               Current Medications (including today's revisions)  ? acetaminophen (TYLENOL EXTRA STRENGTH) 500 mg tablet Take one tablet by mouth every 6 hours as needed. Max of 4,000 mg of acetaminophen in 24 hours.   ? allopurinoL (ZYLOPRIM) 300 mg tablet Take one-half tablet by mouth at bedtime daily. Take with food. (Patient taking differently: Take one tablet by mouth at bedtime daily. Take with food.)   ? amiodarone (CORDARONE) 200 mg tablet Take one tablet by mouth daily. Take with food.   ? bumetanide (BUMEX) 2 mg tablet Take two tablets by mouth twice daily. Indications: accumulation of fluid resulting from chronic heart failure   ? calcium carbonate (TUMS E-X) 300 mg (750 mg) chewable tablet Chew two tablets by mouth at bedtime daily. ? CHOLEcalciferoL (vitamin D3) (VITAMIN D-3) 5000 unit tablet Take one tablet by mouth daily.   ? clotrimazole (LOTRIMIN) 1 % topical cream Apply  topically to affected area twice daily. Apply to affect areas of feet for  tinea pedis   ? dapagliflozin propanediol (FARXIGA) 10 mg tablet Take one tablet by mouth daily.   ? docusate (COLACE) 100 mg capsule Take one capsule by mouth twice daily.   ? finasteride (PROSCAR) 5 mg tablet Take one tablet by mouth daily.   ? hydrocortisone (CORTIZONE-10) 1 % topical cream Apply  topically to affected area twice daily. Apply to face/groin/underarms   ? levothyroxine (SYNTHROID) 50 mcg tablet TAKE 1 TABLET BY MOUTH EVERY DAY 30 MINUTES BEFORE BREAKFAST   ? menthol/camphor (BIOFREEZE TP) Apply  topically to affected area three times daily as needed (low back pain).   ? metoprolol XL (TOPROL XL) 25 mg extended release tablet Take one-half tablet by mouth daily. HOLD IF SBP < 100   ? ondansetron HCL (ZOFRAN) 4 mg tablet Take one tablet by mouth every 6 hours as needed for Nausea or Vomiting.   ? polyethylene glycol 3350 (MIRALAX) 17 g packet Take one packet by mouth daily as needed (Constipation - Second Line).   ? potassium chloride SR (KLOR-CON M20) 20 mEq tablet Take two tablets by mouth twice daily. Take with a meal and a full glass of water.   ? senna/docusate (SENOKOT-S) 8.6/50 mg tablet Take one tablet by mouth daily as needed (Constipation - First Line).   ? tamsulosin (FLOMAX) 0.4 mg capsule Take one capsule by mouth daily after breakfast. Do not crush, chew or open capsules. Take 30 minutes following the same meal each day.   ? thiamine (VITAMIN B-1) 100 mg tablet Take one tablet by mouth daily.   ? triamcinolone acetonide (KENALOG) 0.1 % topical ointment Apply  topically to affected area twice daily. Use for the rest of your body besides face/groin/underarms            Total Time Today was 40 minutes in the following activities: Preparing to see the patient, Obtaining and/or reviewing separately obtained history, Performing a medically appropriate examination and/or evaluation, Counseling and educating the patient/family/caregiver, Ordering medications, tests, or procedures, Referring and communication with other health care professionals (when not separately reported) and Documenting clinical information in the electronic or other health record     Sue Lush,  CHFN, APRN, NP-C  Advanced Heart Failure APP  [facility]      Collaborating physician Kathleen Argue, MD

## 2021-08-21 NOTE — Telephone Encounter
Called and spoke with Evelena Peat at pt facility, [facility]. Reviewed AVS including med changes, daily dry wt goal, standing lab orders and f/u appt.    Copy sent with pt's daughter to provide and also faxed to facility as requested.

## 2021-08-21 NOTE — Telephone Encounter
Called and LMOM of pt's daughter with update we added device check after today's HF f/u appt with Angela Nevin, NP.  Device nurse will come to [facility] and do device check in in clinic room and review with Carla.      Called and spoke with pt's nurse at facility- [facility]. She confirms lab results and will fax to HF RF.    She will also send copy with pt along with med list, vitals, and daily wts.

## 2021-08-22 ENCOUNTER — Encounter: Admit: 2021-08-22 | Discharge: 2021-08-22 | Payer: MEDICARE

## 2021-08-22 MED ORDER — BUMETANIDE 2 MG PO TAB
5 mg | ORAL_TABLET | Freq: Two times a day (BID) | ORAL | 3 refills | Status: AC
Start: 2021-08-22 — End: ?

## 2021-08-22 NOTE — Telephone Encounter
Received call from [facility]) [phone] Fax) [phone] reporting pt is actually taking bumex 5 mg BID and not 4 mg BID. Facility nurse wanted APRN Sandy Salaam to be aware of this.

## 2021-08-22 NOTE — Telephone Encounter
Rec'd call from [name] with [name]. Wanted to clarify his bumex that he is taking 5mg  BID but was changed to 4mg  BID at HF clinic yesterday. Reports his weight is 206.5lb as of today.  Returned [name] call and provided her with HF clinic nurse triage number, advised they will be managing any further diuretic adjustments since seeing him yesterday. She will give their office a call.

## 2021-08-22 NOTE — Telephone Encounter
Spoke with Jessica Priest, RN at [facility] with Carla's recommendation that pt should be taking 5mg  Bumex BID as he has been. Instructed RN to notify clinic if any medications change from current list by providers that are not with AHF clinic. Med list reviewed and all medications listed are correct. Instructed RN to fax most recent (7/21) BMP results and all BMPs to follow to [phone]. Next BMP to be drawn 7/28.

## 2021-08-22 NOTE — Telephone Encounter
Sandy Salaam D, APRN-NP   (4:52 PM)     Please thank them for clarifying. I want him to stay on the dose he is taking DO NOT REDUCE DOSE. Please make a note to reflect that he is actually on 5 mg BID instead of 4 mg BID. Please also ask that they let us know if they are adjusting his diuretic dose and make sure they send [name] lab results, which they had NOT been doing. This is twice now that they sent [name] a medication list that was incorrect - at least with incorrect dose of bumex.

## 2021-08-30 ENCOUNTER — Encounter: Admit: 2021-08-30 | Discharge: 2021-08-30 | Payer: MEDICARE

## 2021-08-30 ENCOUNTER — Ambulatory Visit: Admit: 2021-08-30 | Discharge: 2021-08-31 | Payer: MEDICARE

## 2021-08-30 DIAGNOSIS — I10 Essential (primary) hypertension: Secondary | ICD-10-CM

## 2021-08-30 DIAGNOSIS — Z8679 Personal history of other diseases of the circulatory system: Secondary | ICD-10-CM

## 2021-08-30 DIAGNOSIS — Z95810 Presence of automatic (implantable) cardiac defibrillator: Secondary | ICD-10-CM

## 2021-08-30 DIAGNOSIS — N289 Disorder of kidney and ureter, unspecified: Secondary | ICD-10-CM

## 2021-08-30 DIAGNOSIS — I2510 Atherosclerotic heart disease of native coronary artery without angina pectoris: Secondary | ICD-10-CM

## 2021-08-30 DIAGNOSIS — I255 Ischemic cardiomyopathy: Secondary | ICD-10-CM

## 2021-08-30 DIAGNOSIS — R55 Syncope and collapse: Secondary | ICD-10-CM

## 2021-08-30 DIAGNOSIS — Z955 Presence of coronary angioplasty implant and graft: Secondary | ICD-10-CM

## 2021-08-30 DIAGNOSIS — I872 Venous insufficiency (chronic) (peripheral): Secondary | ICD-10-CM

## 2021-08-30 DIAGNOSIS — M1990 Unspecified osteoarthritis, unspecified site: Secondary | ICD-10-CM

## 2021-08-30 DIAGNOSIS — I87303 Chronic venous hypertension (idiopathic) without complications of bilateral lower extremity: Secondary | ICD-10-CM

## 2021-08-30 DIAGNOSIS — E785 Hyperlipidemia, unspecified: Secondary | ICD-10-CM

## 2021-08-30 DIAGNOSIS — T148XXA Other injury of unspecified body region, initial encounter: Secondary | ICD-10-CM

## 2021-08-30 DIAGNOSIS — B353 Tinea pedis: Secondary | ICD-10-CM

## 2021-08-30 NOTE — Progress Notes
Date of Service: 08/30/2021    Subjective:             Logan Bright is a 86 y.o. male.    History of Present Illness  New patient who presents for hospital follow up on eczematous dermatitis    Patient is a 86 year old with a history of heart failure, hypothyroidism, and CKD who states that 4 months ago he developed itchiness and a red rash on lower legs. The rash progressed over weeks to involve his trunk and upper extremities. He was admitted for heart failure exacerbation in May and dermatology was consulted. A biopsy was completed at that time demonstrating spongiotic dermatitis. Patient was started on triamcinolone and hydrocortisone. He was admitted again in June for heart failure exacerbation and dermatology was consulted again. Topical steroids were recommended again at that time in addition to vinegar soaks. Patient was discharged from hospital and has been in a care facility since then. He states that he has only been treated with topical triamcinolone since being sent to the facility. Reports twice a day application and gradual improvement in full body rash but without complete resolution and continued breakthrough pruritus. Patient reports no history of eczema, asthma, or allergies as a child or adult. He reports no other residents in the facility with similar rash. He has been using ace wraps but has not been wearing compression socks for his statis dermatitis. He has not been treating his tinea pedis with topical butinafine or with vinegar soaks. Patient and family seeking diagnosis and treatment options today.    PMH: No history of skin cancer  FMH: No history of skin cancer in family  SH: lives in care home       Review of Systems   Constitutional: Negative for appetite change, diaphoresis, fatigue, fever and unexpected weight change.   HENT: Negative for congestion, mouth sores and sore throat.    Eyes: Negative for pain, redness, itching and visual disturbance.   Respiratory: Negative for cough and shortness of breath.    Cardiovascular: Negative for palpitations and leg swelling.   Gastrointestinal: Negative for abdominal pain, blood in stool, diarrhea, nausea and vomiting.   Genitourinary: Negative for difficulty urinating and hematuria.   Musculoskeletal: Negative for arthralgias and myalgias.   Neurological: Negative for dizziness, seizures and facial asymmetry.   Hematological: Does not bruise/bleed easily.   Psychiatric/Behavioral: Negative for confusion and dysphoric mood. The patient is not nervous/anxious.          Objective:         ? acetaminophen (TYLENOL EXTRA STRENGTH) 500 mg tablet Take one tablet by mouth every 6 hours as needed. Max of 4,000 mg of acetaminophen in 24 hours.   ? allopurinoL (ZYLOPRIM) 300 mg tablet Take one-half tablet by mouth at bedtime daily. Take with food. (Patient taking differently: Take one tablet by mouth at bedtime daily. Take with food.)   ? amiodarone (CORDARONE) 200 mg tablet Take one tablet by mouth daily. Take with food.   ? bumetanide (BUMEX) 2 mg tablet Take 2.5 tablets by mouth twice daily. Indications: accumulation of fluid resulting from chronic heart failure   ? calcium carbonate (TUMS E-X) 300 mg (750 mg) chewable tablet Chew two tablets by mouth at bedtime daily.   ? CHOLEcalciferoL (vitamin D3) (VITAMIN D-3) 5000 unit tablet Take one tablet by mouth daily.   ? clotrimazole (LOTRIMIN) 1 % topical cream Apply  topically to affected area twice daily. Apply to affect areas of  feet for tinea pedis   ? dapagliflozin propanediol (FARXIGA) 10 mg tablet Take one tablet by mouth daily.   ? docusate (COLACE) 100 mg capsule Take one capsule by mouth twice daily.   ? finasteride (PROSCAR) 5 mg tablet Take one tablet by mouth daily.   ? hydrocortisone (CORTIZONE-10) 1 % topical cream Apply  topically to affected area twice daily. Apply to face/groin/underarms   ? levothyroxine (SYNTHROID) 50 mcg tablet TAKE 1 TABLET BY MOUTH EVERY DAY 30 MINUTES BEFORE BREAKFAST   ? menthol/camphor (BIOFREEZE TP) Apply  topically to affected area three times daily as needed (low back pain).   ? metoprolol XL (TOPROL XL) 25 mg extended release tablet Take one-half tablet by mouth daily. HOLD IF SBP < 100   ? ondansetron HCL (ZOFRAN) 4 mg tablet Take one tablet by mouth every 6 hours as needed for Nausea or Vomiting.   ? polyethylene glycol 3350 (MIRALAX) 17 g packet Take one packet by mouth daily as needed (Constipation - Second Line).   ? potassium chloride SR (KLOR-CON M20) 20 mEq tablet Take two tablets by mouth twice daily. Take with a meal and a full glass of water.   ? senna/docusate (SENOKOT-S) 8.6/50 mg tablet Take one tablet by mouth daily as needed (Constipation - First Line).   ? tamsulosin (FLOMAX) 0.4 mg capsule Take one capsule by mouth daily after breakfast. Do not crush, chew or open capsules. Take 30 minutes following the same meal each day.   ? thiamine (VITAMIN B-1) 100 mg tablet Take one tablet by mouth daily.   ? triamcinolone acetonide (KENALOG) 0.1 % topical ointment Apply  topically to affected area twice daily. Use for the rest of your body besides face/groin/underarms     Vitals:    08/30/21 0919   PainSc: Zero   Weight: 93 kg (205 lb)   Height: 182.9 cm (6')     Body mass index is 27.8 kg/m?[name]     Physical Exam    General: Alert and oriented, no acute distress  Eyes: conjunctiva clear, normal EOM    Areas Examined (all normal unless noted below):  Head/Face  Neck  Chest/breasts/axillae  Back  Abdomen  Buttocks  R upper ext  L upper ext  R lower ext  L lower ext    Patient declined FBSC    Pertinent findings include:    Multiple brown and tan evenly pigmented macules are distributed over the head, neck, trunk, arms and legs.  All have symmetric similar dermascopic findings with primarily globular and reticular patterns.    Soft, pigmented, stuck-on-appearing papules are distributed over the trunk.  All have symmetric pebbled dermoscopic findings.    Numerous scattered red scaly papules and plaques on head, trunk, and extremities. Many with overlying excoriations    Plaques on back seem to follow skin migration lines but there does not seem to be a prevalence for sun exposed vs non sun exposed sites    Thickened lichenified red excoriated plaques throughout bilateral hands including in interweb spaces     Overlapping dry scale in a dry riverbed appearance on anterior and posterior trunk    Dermatitis A) scaly red indurated plaque on right upper back    Dermatitis B) scaly red thick papule on left forearm       Assessment and Plan:  Eczematous Dermatitis  Atopic Dermatitis vs Cutaneous T cell lymphoma > scabies vs tinea corporis vs other eczematous dermatitis  - patients diffuse scaly papules and plaques  with a predisposition for the flexural surfaces and scattered overlapping scale with a dry riverbed appearance is consistent with atopic dermatitis. The pattern of involvement on the back and the gradual development and worsening over the last 4 months raises suspicion for CTCL. Finger web involvement raises suspicion for scabies.  - discussed potential diagnosis, further diagnostic options, and treatment options  - offered additional biopsies to add additional information in the assessment for CTCL as well as other diagnoses   - biopsies completed as below  - photodocumentation today in clinic with patient consent  - continue triamcinolone 0.1% ointment BID to affected areas of trunk and extremities (avoid flexural areas and face)  - start Hydrocortisone 2.5% topical cream BID to areas of rash on face/groin/underarms   - start daily moisturizing cream such as Cerave cream, cetaphil cream, or eucerin cream to entire body especially after bathing  - will call family and update with biopsy results and corresponding follow up and treatment plan    Stasis Dermatitis  - Discussed diagnosis, etiology, chronicity, and treatment options  - Counseled to avoid application of potential allergens  - Apply (RX) Triamcinolone 0.1% ointment to AA of legs BID PRN flares up to 2 weeks at a time.   - Discussed leg elevation to help with swelling, recommend compression stockings if no history/concern for arterial disease  - When not using topical steroids, recommend Vaseline or other hypoallergenic moisturizer BID    Tinea Pedis  - Discussed diagnosis, course of disease, prognosis, and treatment with patient  - start RX butenafine 1% cream BID until resolved  - start vinegar soaks: one cup of vinegar to each gallon of water and soak feet for 15 minutes per day  to AA daily until resolved  - ensure properly washing socks and shoes    RTC Pending biopsy results    Dermatitis Biopsy note  - A:right upper back B: left forearm DDX: CTCL vs other eczematous dermatitis vs infectious  - shave biopsy today  - photodocumentation in chart  - patient informed of risk of infection, bleeding, and scarring  - patient consented to biopsy  - will call patient with abnormal results  - patient provided with instructions on wound care  Shave biopsy procedure note  Risk and benefits of the above procedure including bleeding, pain, dyspigmentation, scar, infection, recurrence or nerve damage with loss of muscle function and/or skin sensation were discussed with the patient (or legal guardian) in detail, who afterwards decided to proceed with the procedure.  Preparation:  Alcohol   Anesthesia:  1% lidocaine with epinephrine  Instrument:  Dermablade  Hemostasis:  AlCl3  Closure:  None  Wound dressing:  Vaseline  Wound care instructions given:  Verbal  Complications:  None  Tolerated well: Yes  Ambulated from room:  Yes  Pathology sent to:  [facility]  Duration of proedure:  >5 minutes  Procedure Time Out Check List:  Prior to the start of the procedure, I personally confirmed the following:  Site Marking Verified: Yes, as appropriate  Patient Identity (name & date of birth): Yes  Procedure: Yes  Site: Yes  Body Part: see above  The risks of the procedure, including infection, bleeding, pain and skin changes, were discussed with the patient.

## 2021-08-31 DIAGNOSIS — L309 Dermatitis, unspecified: Secondary | ICD-10-CM

## 2021-09-03 ENCOUNTER — Encounter: Admit: 2021-09-03 | Discharge: 2021-09-03 | Payer: MEDICARE

## 2021-09-03 NOTE — Telephone Encounter
Received call from [place] with [facility] asking for call to [phone] to clarify if there is anyone else in pts home.

## 2021-09-04 ENCOUNTER — Encounter: Admit: 2021-09-04 | Discharge: 2021-09-04 | Payer: MEDICARE

## 2021-09-04 ENCOUNTER — Ambulatory Visit: Admit: 2021-09-04 | Discharge: 2021-09-05 | Payer: MEDICARE

## 2021-09-04 DIAGNOSIS — I150 Renovascular hypertension: Secondary | ICD-10-CM

## 2021-09-04 DIAGNOSIS — I10 Essential (primary) hypertension: Secondary | ICD-10-CM

## 2021-09-04 DIAGNOSIS — I480 Paroxysmal atrial fibrillation: Secondary | ICD-10-CM

## 2021-09-04 DIAGNOSIS — T148XXA Other injury of unspecified body region, initial encounter: Secondary | ICD-10-CM

## 2021-09-04 DIAGNOSIS — R55 Syncope and collapse: Secondary | ICD-10-CM

## 2021-09-04 DIAGNOSIS — I255 Ischemic cardiomyopathy: Secondary | ICD-10-CM

## 2021-09-04 DIAGNOSIS — M1990 Unspecified osteoarthritis, unspecified site: Secondary | ICD-10-CM

## 2021-09-04 DIAGNOSIS — I2510 Atherosclerotic heart disease of native coronary artery without angina pectoris: Secondary | ICD-10-CM

## 2021-09-04 DIAGNOSIS — Z95810 Presence of automatic (implantable) cardiac defibrillator: Secondary | ICD-10-CM

## 2021-09-04 DIAGNOSIS — Z8679 Personal history of other diseases of the circulatory system: Secondary | ICD-10-CM

## 2021-09-04 DIAGNOSIS — E785 Hyperlipidemia, unspecified: Secondary | ICD-10-CM

## 2021-09-04 DIAGNOSIS — Z955 Presence of coronary angioplasty implant and graft: Secondary | ICD-10-CM

## 2021-09-04 DIAGNOSIS — N289 Disorder of kidney and ureter, unspecified: Secondary | ICD-10-CM

## 2021-09-04 DIAGNOSIS — I5042 Chronic combined systolic (congestive) and diastolic (congestive) heart failure: Secondary | ICD-10-CM

## 2021-09-04 MED ORDER — BUMETANIDE 2 MG PO TAB
ORAL_TABLET | 3 refills | Status: AC
Start: 2021-09-04 — End: ?

## 2021-09-04 MED ORDER — POTASSIUM CHLORIDE 20 MEQ PO TBTQ
60 meq | ORAL_TABLET | Freq: Two times a day (BID) | ORAL | 3 refills | 30.00000 days | Status: AC
Start: 2021-09-04 — End: ?

## 2021-09-04 NOTE — Telephone Encounter
Spoke to [employer] to prep for clinic OV today with [facility]. Confirmed last lab 7/22 which should be weekly. Will send results with patient. Per [facility] patient has been receiving Bumex 4 mg BID since 7/26.     OV 7/25 there was a clarification made with [facility] on patient's bumex dose as we had 4 mg bid and confirmed with NH he was receiving 5 mg bid.     7/26 Telephone note NH was contacted and told to continue the dose of bumex he was receiving Bumex 5 mg BID.    This RN called today and told that since 7/26 patient has been receiving Bumex 4 mg BID, confirmed this with med list sent with patient for OV today.    [facility] notified. POC BMP to be done today.

## 2021-09-04 NOTE — Patient Instructions
Thank you for coming to [facility]. Your instructions today:    Med changes today:  Increase bumex to 6 mg in AM and 4 mg in the afternoon   Increase Kdur to 60 mEq twice a day  Please log your weight, blood pressure, and heart rate on the following page and bring to all visits.  Continue with orders for BMP once a week  Continue with 2 liter/day or 64 ounces/day fluid restriction and 2 gm (2000 mg) sodium restriction/day  Continue with ACE wraps on in the AM and off in the PM. Knee high as much as possible.   Return to see Fara Boros, NP in 1 month      Please call the office with any questions or concerns [phone] (nurse triage).   To schedule or change an appointment call [phone].  Fax: [phone]    At the [facility], you and your family are our top priority.  You may receive a survey via email or text message that we are asking you to complete.  We value your feedback to ensure you are satisfied with every visit and we are continually providing the highest quality of patient care.  We know your time is valuable and thank you in advance for completing the survey.    Lab and test results:  As a part of the CARES act, starting 04/29/2019, some results will be released to you via mychart immediately and automatically.  You may see results before your provider sees them; however, your provider will review all these results and then they, or one of their team, will notify you of result information and recommendations.   Critical results will be addressed immediately, but otherwise, please allow [name] time to get back with you prior to you reaching out to [name] for questions.  This will usually take about 72 hours for labs and 5-7 days for procedure test results.    It was a pleasure to see you in clinic today. Thank you for allowing [name] to be a part of your health care.     Sue Lush, CHFN, APRN-NP    [facility]  Advanced [facility] - Silver Team  Kathleen Argue, MD   Bing Matter, MD  Nurse Practitioners: Satira Anis, Fara Boros, Eyvonne Left, and Gates Rigg  RNs: Mick Sell, [address] Leafy Ro, Vivien Rota, and Frederich Cha

## 2021-09-04 NOTE — Progress Notes
Date of Service: 09/04/2021    Logan Bright is a 86 y.o. male.       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 ~ 1.6-2, PAFib not on AC due to hx of hematuria, hx of cryptogenic stroke in 2017,  gout, mild cognitive impairment, Hypothyroidism and elevated BMI. He follows with Dr. Barry Dienes, and has been seen previously by Shon Baton, NP.     He was admitted from SNF with hypervolemia/weight gain  In May and twice in June 2023. Daughter reports significant non-compliance with diet, stating he doesn't understand what he should and should not be eating although he lives in a facility. He has been IV diuresed with bumex. Evaluation for furoscix was initiated prior to most recent d/c on 07/30/21, pending patient assistance application.    He was last seen by me on 08/21/21 at which time bumex was continued at 4 mg BID without changes, with increase in potassium to 40 mEq BID. Facility called back the next day to clarify that he was in fact receiving 5 mg BID already and message was relayed that he was to stay on same dose and not reduce the dose, therefore, to remain on 5 mg BID.    RN called facility prior to visit today to clarify medications and request weekly BMP that was to be drawn at facility. She learned that he was taking bumex 4 mg BID since 08/22/21 and has not had any labs drawn since my last visit. Daughter tells me that patient had ACE wraps on for one week straight without being removed while she was out of town and his LE swelling was terrible around the bandages. She has spoken with administration about that.    Pt presents today reporting that he is able to walk the halls &/or ride on an elyptical type machine for 20 minutes without SOB, CP or dizziness. He sleeps with HOB elevated but denies PND. Weights documented from facility from 208 pounds down to 206 pounds and back up to 209 pounds since I saw him last. Daughter reports increased compliance with fluid and low sodium diet, as much as they can control.        Vitals:    09/04/21 1001   BP: 110/62   BP Source: Arm, Right Upper   Pulse: 87   SpO2: 100%   O2 Device: None (Room air)   Weight: 95.9 kg (211 lb 6.4 oz)   Height: 182.9 cm (6')     Body mass index is 28.67 kg/m?[name]   Wt Readings from Last 3 Encounters:   09/04/21 95.9 kg (211 lb 6.4 oz)   08/30/21 93 kg (205 lb)   08/21/21 93.6 kg (206 lb 6.4 oz)        Past Medical History  Patient Active Problem List    Diagnosis Date Noted   ? Fluid overload 06/18/2021   ? Hypovitaminosis D 07/03/2020   ? Colitis 05/26/2020   ? Hematuria 05/26/2020   ? Acute kidney injury superimposed on CKD (HCC) 05/26/2020   ? Coronary artery disease involving native coronary artery of native heart without angina pectoris 05/23/2020   ? Diverticulitis 05/22/2020   ?  Chronic combined systolic and diastolic CHF, NYHA class 3 (HCC) 03/30/2020   ? Cognitive impairment 03/30/2020   ? Cellulitis of scrotum 03/30/2020   ? Stage 3b chronic kidney disease (CKD) (HCC) 03/30/2020   ? Cognitive dysfunction 03/09/2020   ? Shortness of breath 10/29/2019   ? Heart failure exacerbated by sotalol (HCC) 10/27/2019   ? Heart failure (HCC) 07/01/2019   ? History of polycythemia 12/14/2018   ? Hyponatremia 09/04/2018   ? Hypothyroidism 09/04/2018   ? Paroxysmal atrial fibrillation (HCC) 04/07/2018   ? Cryptogenic stroke [facility]) 02/06/2016     2017 - Eye doctor told him he'd had an old stroke after doing an eye exam    08/06/16 Carotid Ultrasound [facility]): Bilateral antegrade vertebral artery flow. No hemodynamically significant common or internal carotid artery stenosis.     ? Closed trimalleolar fracture of right ankle 11/12/2015   ? Debility 06/10/2013   ? Recurrent ventricular tachycardia w/ 2 VT ablations 04/2013 05/11/2013     Sustained VT w/ multiple ICD tachypacing and Shocks, failed amiodaron   05/05/13 & 05/12/13  VT ablations [facility] w/ impella support 4/15, Home off Coreg and losartan d/t post procedure hematoma, hypotension     ? AKI (acute kidney injury) (HCC) 04/16/2013   ? 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 Ermelinda Das.        ? Ischemic cardiomyopathy      6/02 - infarct 2 stents .  [facility]            4/04 - Surveillance stress test at [facility]: reportedly negative.  4/05 - Stress thallium study: EF 30%, LAD and RCA defects, elevated lung uptake.- Echo doppler: EF 25%, amt& inf hypokinesis,  apical akinesis, poss  thrombus, mild valvular Regurg       5/06 - Stress thallium: EF 22%, no signficant interval change.  08/2011 - Dobut echo.  EF 10-15%. Antero-apical akinesis/dyskinesis.  No ischemia.  04/2013 RegadenThall EF 23% Extensive Anterior infarct, no ischemia, unchanged from prior     ? Chronic HFrEF (heart failure with reduced ejection fraction) (HCC)       2003 - Status post ICD implant.    4/05 - EF 25-30% per stress thallium and echo doppler.  08/2011 - Echo EF 10-15%     ? Neurologic cardiac syncope        3/05 - Developed syncope while at restaurant, taken to ED.  Symptoms of diaphoresis                    and lightheadedness.          4/05 - Head up tilt study: positive for inducing hypotension 72/49. Produced symptoms                    c/w previous syncopal episode.     ? HTN (hypertension)                 2002 - Diagnosis established.     ? Dyslipidemia         09/26/2008  03/14/2009  02/26/2011  04/17/2012  03/31/2013   LDL 107 (H) 141 (H) 80 108 (H) 104 (H)   04/2013: Refuses Statins understands that they reduce recurrent CV events, still declines.      ? Gout  ROS   * see HPI      Physical Exam    Alert and appropriate, difficulty answering questions, limited by dementia  JVD 7-8 cm at 90 degrees with (-) HJR  No abdominal distention  1-2+ bil LE edema, ACE wraps in place to mid shin with 2+ edema above   Bil UE with generalized redness, blanching  Breathing comfortably without wheezing, rales or rhonchi  Auscultated heart with normal S1S2, regular rhythm, no obvious murmur   Able to walk into clinic unassisted       Cardiovascular Studies  Echo 06/19/21  1. Severely impaired left ventricular systolic function with an EF of 20%.  The left ventricle is severely dilated, the end-diastolic dimension was 6.5 cm, however the end-diastolic volume was 317 mL with an index of 146 mL.  Squared.  2. 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  3. Unable to assess diastolic function.  4. The RV is probably dilated with preserved function.  5. Right-sided pacer leads were noted.  6. There is nonspecific mitral thickening with probably moderate MR.  7. Mild TR.  8. Aortic valve sclerosis without stenosis.  Mild to moderate AI.[name]  9. Unable to calculate a peak pulmonary pressure.  10. The IVC was consistent with an elevated right atrial pressure.  11. Aortic root was mildly dilated, the ascending aorta was moderately dilated.  12. No pericardial effusion.    ?  Prior study was obtained on 05/24/2020.  It demonstrated an EF of 20%, the end-diastolic dimension was 7.2 cm, the end-diastolic volume was 193 mL with an index of 98 mL/m?, the RV function was preserved, right-sided device leads were noted, there is nonspecific mitral valve thickening, there is aortic valve sclerosis without stenosis, Doppler interrogation was not performed, the aortic root was dilated, the IVC is consistent with a normal central venous pressure, there is no pericardial effusion.  ?  The LV systolic function remains similar in both studies.  The akinetic areas on the prior study have a somewhat more aneurysmal change on the current study.  Otherwise the 2 studies are similar.    PYP 09/07/18:  This study is negative and not suggestive of ATTR cardiac amyloidosis.  A negative exam does not exclude AL cardiac amyloidosis.    MPI 04/14/18:  This study is markedly abnormal.  Extensive, severely reduced mid to apical uptake defects in all cardiac segments, partially fixed.  Suggestive of extensive prior myocardial injury with mild peri-infarct ischemia (in the mid segments).this is unchanged compared to prior ADAC study (04/21/2012).  Current summed stress score is 55 (51 on prior ADAC study). Dilated left ventricle with severely reduced left ventricular ejection fraction, EF of 18% (20% on prior study).  High risk prognostic indicators for annual cardiovascular mortality remain present. Nonischemic response to ECG.          Problems Addressed Today  Encounter Diagnoses   Name Primary?   ? Paroxysmal atrial fibrillation (HCC) Yes   ? Ischemic cardiomyopathy    ? Chronic combined systolic and diastolic CHF, NYHA class 3 (HCC)    ? Renovascular hypertension        Assessment and Plan     HFrEF related to ICM  - Last LVEF 20% from echo 06/19/21  - NYHA class III Stage C  - Dry weight: 195 pounds  - amyloid workup negative  - hypervolemic on exam  today with 1- 2+ lower extremity edema to his knees  > PLAN:   - increase bumex to 6 mg in AM and 4 mg PM  - increase potassium to 60 mEq BID with POC K+ 3.6 today on 40 mEq BID  - daughter turned in patient assistance paperwork for Furoscix 09/03/21      GDMT Current Dose Changes made at visit   BB Toprol XL 12.5 mg daily     ACEI/ARB/ARNI No ACE/ARB/ARNI due to renal function     Aldosterone antagonist No MRA due to renal function      SGLT2i Farxiga 10 mg daily      Hydralazine/Nitrate NA     Ivabradine NA    HRMT  CRT-D    Diuretics Bumex 4 mg BID  Bumex 6 mg AM and 4 mg PM   Cardiac rehab NA as resides in a SNF    Candidacy for Adv HF therapies Not a candidate due to age      Coronary artery disease  Hx of PCI in 2002 at [facility]   Details unknown  Denies anginal Symptoms  ?  Permanent Atrial Fibrillation  CHA2DS2VASC is elevated.   Not on OAC due to hx of hematuria.   -Currently on amiodarone 200 mg daily and Toprol XL 12.5 mg daily  Managed by Dr. Barry Dienes.   ?  Chronic Kidney Disease Stage IIIB  BL Cr. ~ 1.6-2 Suspect new baseline is closer to 2-2.3.  Last creat 2.0 from 08/21/21  POC creat today 1.9 with K+3.6  ?  VT s/p ICD 2010 upgraded to CRT-D 2020  S/p VT ablation in 2015  Treated with Amidarone    Hypertension  BP today 110/62  Currently on GDMT as per above       DM Type II   >  Lab Results   Component Value Date/Time    HGBA1C 6.6 (H) 03/30/2020 02:00 AM    HGBA1C 6.6 (H) 10/28/2019 04:36 AM    HGBA1C 6.6 (H) 03/31/2013 06:35 AM        Dyslipidemia  Not on STATINS due to patient refusal per chart review.        Lab Results   Component Value Date/Time    CHOL 81 03/30/2020 04:12 AM    TRIG 63 03/30/2020 04:12 AM    HDL 39 (L) 03/30/2020 04:12 AM    LDL 41 03/30/2020 04:12 AM    VLDL 13 03/30/2020 04:12 AM    NONHDLCHOL 42 03/30/2020 04:12 AM    CHOLHDLC 4 04/17/2012 12:00 AM               Current Medications (including today's revisions)  ? acetaminophen (TYLENOL EXTRA STRENGTH) 500 mg tablet Take one tablet by mouth every 6 hours as needed. Max of 4,000 mg of acetaminophen in 24 hours.   ? allopurinoL (ZYLOPRIM) 300 mg tablet Take one tablet by mouth daily. Take with food.   ? amiodarone (CORDARONE) 200 mg tablet Take one tablet by mouth daily. Take with food.   ? bumetanide (BUMEX) 2 mg tablet Take 6 mg in the morning and 4 mg in the afternoon  Indications: accumulation of fluid resulting from chronic heart failure   ? calcium carbonate (TUMS E-X) 300 mg (750 mg) chewable tablet Chew two tablets by mouth at bedtime daily.   ? CHOLEcalciferoL (vitamin D3) (VITAMIN D-3) 5000 unit tablet Take one tablet by mouth daily.   ? clotrimazole (LOTRIMIN) 1 % topical cream Apply  topically to  affected area twice daily. Apply to affect areas of feet for tinea pedis   ? dapagliflozin propanediol (FARXIGA) 10 mg tablet Take one tablet by mouth daily.   ? docusate (COLACE) 100 mg capsule Take one capsule by mouth twice daily.   ? finasteride (PROSCAR) 5 mg tablet Take one tablet by mouth daily.   ? hydrocortisone (CORTIZONE-10) 1 % topical cream Apply  topically to affected area twice daily. Apply to face/groin/underarms   ? levothyroxine (SYNTHROID) 50 mcg tablet TAKE 1 TABLET BY MOUTH EVERY DAY 30 MINUTES BEFORE BREAKFAST   ? menthol/camphor (BIOFREEZE TP) Apply  topically to affected area three times daily as needed (low back pain).   ? metoprolol XL (TOPROL XL) 25 mg extended release tablet Take one-half tablet by mouth daily. HOLD IF SBP < 100   ? ondansetron HCL (ZOFRAN) 4 mg tablet Take one tablet by mouth every 6 hours as needed for Nausea or Vomiting.   ? polyethylene glycol 3350 (MIRALAX) 17 g packet Take one packet by mouth daily as needed (Constipation - Second Line).   ? potassium chloride SR (KLOR-CON M20) 20 mEq tablet Take three tablets by mouth twice daily. Take with a meal and a full glass of water.   ? senna/docusate (SENOKOT-S) 8.6/50 mg tablet Take one tablet by mouth daily as needed (Constipation - First Line).   ? tamsulosin (FLOMAX) 0.4 mg capsule Take one capsule by mouth daily after breakfast. Do not crush, chew or open capsules. Take 30 minutes following the same meal each day.   ? thiamine (VITAMIN B-1) 100 mg tablet Take one tablet by mouth daily.   ? triamcinolone acetonide (KENALOG) 0.1 % topical ointment Apply  topically to affected area twice daily. Use for the rest of your body besides face/groin/underarms            Total Time Today was 40 minutes in the following activities: Preparing to see the patient, Obtaining and/or reviewing separately obtained history, Performing a medically appropriate examination and/or evaluation, Counseling and educating the patient/family/caregiver, Ordering medications, tests, or procedures, Referring and communication with other health care professionals (when not separately reported) and Documenting clinical information in the electronic or other health record     Sue Lush,  CHFN, APRN, NP-C  Advanced Heart Failure APP  [facility]      Collaborating physician Kathleen Argue, MD

## 2021-09-05 ENCOUNTER — Encounter: Admit: 2021-09-05 | Discharge: 2021-09-05 | Payer: MEDICARE

## 2021-09-05 ENCOUNTER — Ambulatory Visit: Admit: 2021-09-05 | Discharge: 2021-09-06 | Payer: MEDICARE

## 2021-09-05 VITALS — BP 113/53 | HR 70 | Ht 72.0 in | Wt 210.4 lb

## 2021-09-05 DIAGNOSIS — N184 Chronic kidney disease, stage 4 (severe): Secondary | ICD-10-CM

## 2021-09-05 LAB — URINALYSIS, MICROSCOPIC

## 2021-09-05 LAB — PROTEIN/CR RATIO,UR RAN: UR TOTAL PROTEIN,RAN: 5 mg/dL

## 2021-09-05 NOTE — Telephone Encounter
Spoke with Shelton Silvas RN about medication changes and reminded her about needing weekly BMP. She states understanding and has no further questions at this time.       From 8/8 OV:    1. Med changes today:  Increase bumex to 6 mg in AM and 4 mg in the afternoon   2. Increase Kdur to 60 mEq twice a day

## 2021-09-05 NOTE — Telephone Encounter
Please call NH to update med changes. Lab weekly with BMP, they do have order but have not been getting drawn. There has been issues with them not giving the correct Bumex dose.     Daughter was with patient at [place] and was given an extra copy of AVS for the NH and printed lab order.

## 2021-09-05 NOTE — Progress Notes
History from review of  prior records  and from  speaking to the patient .  History        CC: Evaluation of chronic kidney disease        HPI: Logan Bright is a 86 y.o. male with past medical history of hypertension, CKD and heart failure.  He was first seen by nephrology in April 2022.  At the time he was admitted on account of acute kidney injury from obstruction.  I saw the patient on 08/03/2021 and at time he reported the following:   The last couple of months has been challenging for the patient with multiple hospital admissions.  In May 2023 he was admitted on account of volume overload due to heart failure with recurrent admissions for the same complaint twice in June 2023.  He was last discharged on 08/26/2021.  He lives at a SNF , [facility].He experiences SOB with minimal exertion and sometimes even at rest. When he walks he has to slow down/stop to catch his breath when walking.He reports lower extremity swelling.He pays close attention to his salt intake.  He is on a 2L fluid restriction .   He is thirsty all the time.    Interval history 09/05/2021: He saw his cardiologist at the heart failure clinic on 08/21/2021 and 09/14/2021.  There was some discussion around dietary adherence.  He has been issues around his medication dosing and communication between his skilled nursing facility and the heart failure clinic which has been managing his diuretic doses.  For instance in July he was supposed to be taking bumetanide 5 mg twice daily but in fact ended up taking 4 mg twice daily.  Yesterday when he saw the heart failure NP, bumetanide was increased to 6 mg a.m. and 4 mg p.m. Potassium replacement increased as well.  I saw him in the company of his daughter.   He is been using ACE wraps. Denies SOB. No obstructive urinary symptoms.      Past Medical History   Medical History:   Diagnosis Date   ? Arthritis    ? CAD (coronary artery disease)    ? Dyslipidemia    ? Fracture     RT ANKLE   ? History of ventricular tachycardia    ? HTN (hypertension)    ? ICD (implantable cardiac defibrillator) in place    ? Ischemic cardiomyopathy    ? Neurologic cardiac syncope    ? Renal insufficiency     mild   ? S/P coronary artery stent placement         Family History  Family History   Problem Relation Age of Onset   ? Cancer Mother    ? Gout Mother    ? Cancer Sister    ? Diabetes Sister    ? Heart Attack Sister    ? Hypertension Sister    ? Gout Sister    ? Arthritis-osteo Sister    ? Stroke Sister    ? Thyroid Disease Sister    ? Migraines Sister         Social History  Social History     Socioeconomic History   ? Marital status: Widowed   ? Number of children: 2   Tobacco Use   ? Smoking status: Never   ? Smokeless tobacco: Never   Vaping Use   ? Vaping Use: Never used   Substance and Sexual Activity   ? Alcohol use: Never   ? Drug  use: Never        Medication    HOME MEDS  ? acetaminophen (TYLENOL EXTRA STRENGTH) 500 mg tablet Take one tablet by mouth every 6 hours as needed. Max of 4,000 mg of acetaminophen in 24 hours.   ? allopurinoL (ZYLOPRIM) 300 mg tablet Take one tablet by mouth daily. Take with food.   ? amiodarone (CORDARONE) 200 mg tablet Take one tablet by mouth daily. Take with food.   ? bumetanide (BUMEX) 2 mg tablet Take 6 mg in the morning and 4 mg in the afternoon  Indications: accumulation of fluid resulting from chronic heart failure   ? calcium carbonate (TUMS E-X) 300 mg (750 mg) chewable tablet Chew two tablets by mouth at bedtime daily.   ? CHOLEcalciferoL (vitamin D3) (VITAMIN D-3) 5000 unit tablet Take one tablet by mouth daily.   ? clotrimazole (LOTRIMIN) 1 % topical cream Apply  topically to affected area twice daily. Apply to affect areas of feet for tinea pedis   ? dapagliflozin propanediol (FARXIGA) 10 mg tablet Take one tablet by mouth daily.   ? diphenhydramine HCl (BENADRYL ALLERGY PO) Take 50 mg by mouth daily.   ? docusate (COLACE) 100 mg capsule Take one capsule by mouth twice daily. ? finasteride (PROSCAR) 5 mg tablet Take one tablet by mouth daily.   ? hydrocortisone (CORTIZONE-10) 1 % topical cream Apply  topically to affected area twice daily. Apply to face/groin/underarms   ? levothyroxine (SYNTHROID) 50 mcg tablet TAKE 1 TABLET BY MOUTH EVERY DAY 30 MINUTES BEFORE BREAKFAST   ? menthol/camphor (BIOFREEZE TP) Apply  topically to affected area three times daily as needed (low back pain).   ? methylPREDNISolone (MEDROL) 4 mg tablet Take one tablet by mouth every morning. Take with food.  Take as directed per [facility]   ? metoprolol XL (TOPROL XL) 25 mg extended release tablet Take one-half tablet by mouth daily. HOLD IF SBP < 100   ? polyethylene glycol 3350 (MIRALAX) 17 g packet Take one packet by mouth daily as needed (Constipation - Second Line).   ? potassium chloride SR (KLOR-CON M20) 20 mEq tablet Take three tablets by mouth twice daily. Take with a meal and a full glass of water.   ? senna/docusate (SENOKOT-S) 8.6/50 mg tablet Take one tablet by mouth daily as needed (Constipation - First Line).   ? tamsulosin (FLOMAX) 0.4 mg capsule Take one capsule by mouth daily after breakfast. Do not crush, chew or open capsules. Take 30 minutes following the same meal each day.   ? thiamine (VITAMIN B-1) 100 mg tablet Take one tablet by mouth daily.   ? triamcinolone acetonide (KENALOG) 0.1 % topical ointment Apply  topically to affected area twice daily. Use for the rest of your body besides face/groin/underarms          Review of Systems  Constitutional: negative  Eyes: negative  Ears, nose, mouth, throat, and face: negative  Respiratory: negative  Cardiovascular: negative  Gastrointestinal: negative  Genitourinary:negative  Integument/breast: negative  Hematologic/lymphatic: negative  Musculoskeletal:negative  Neurological: negative  Endocrine: negative      Physical Exam        Vitals:    09/05/21 0805   BP: 113/53   BP Source: Arm, Left Upper   Pulse: 70   Temp: Comment: afebrile per pt.   PainSc: Zero   Weight: (P) 95.4 kg (210 lb 6.4 oz)   Height: 182.9 cm (6')     Body mass  index is 28.54 kg/m? (pended).     Wt Readings from Last 10 Encounters:   09/05/21 (P) 95.4 kg (210 lb 6.4 oz)   09/04/21 95.9 kg (211 lb 6.4 oz)   08/30/21 93 kg (205 lb)   08/21/21 93.6 kg (206 lb 6.4 oz)   08/03/21 93.4 kg (206 lb)   07/30/21 91 kg (200 lb 9.9 oz)   07/24/21 98.1 kg (216 lb 3.2 oz)   07/18/21 91.5 kg (201 lb 12.8 oz)   06/27/21 94.3 kg (208 lb)   06/23/21 93.5 kg (206 lb 2.1 oz)     BP Readings from Last 5 Encounters:   09/05/21 113/53   09/04/21 110/62   08/21/21 110/60   08/03/21 (P) 98/48   07/30/21 106/57     Gen: Alert and Oriented, No Acute Distress   HEENT: Sclera normal; MMM  CV:  S1 and S2 normal, no rubs, murmurs or gallops   Pulm: Clear to Auscultation bilateral   GI: BS+ x4, non-tender to palpation  Neuro: Grossly normal, moving all extremities, speech intact  Ext: no edema, clubbing or cyanosis   Skin: no rash     Assessment and Plan        Deundra Bard is a 86 y.o. male     -Chronic kidney disease stage IIIB:  CKD is likely due to prior episodes of AKI, heart failure and age related decline in his kidney function.   He has severely reduced EF of 20% and thus progressive CKD would be exacerbated by chronic heart failure as well.  No proteinuria on recent assessment and the suggest a largely tubulointerstitial or vascular process rather than a primarily glomerular process accounting for CKD.  His eGFR remains stable. I discussed with him regarding medication safety and sick day rules. We also discussed his preference in respect to renal replacement therapy. He would not want renal replacement therapy.  Follow-up UPCR.    -Volume overload:  He continues to experience fluctuation in his weight and has lower extremity swelling. Bumetanide was increased to 6mg  am and 4mg  pm by his heart failure team yesterday.  He has follow up BMP ordered.    - Hypertension:Blood pressure control is adequate. Continue antihypertensives as above.      Doran Durand, MD      Patient Instructions   The following are some measures which may help to slow down the progression of kidney disease:    Avoid medications known as  NSAIDs which  include Advil, Motrin,Aleve, ibuprofen, naproxen.    Maintain adequate fluid intake.    Acute illnesses like the flu and COVID which may end up in hospitalization may impact on  kidney function and may cause the kidneys to fail.    It is important to keep a up with your regular vaccinations for these diseases and for the pneumonia vaccine as well.    If you develop diarrhea or vomiting and unable to keep up with your fluid intake, please report to the nearest ED since it may affect blood pressure and affect the blood flow to the kidney    Some medications used in patients with kidney disease including diuretics [water pill], some blood pressure medications [ACE inhibitors and ARB] like lisinopril, losartan may need to be held if you develop severe diarrhea or vomiting.  Under these conditions these medications may worsen kidney function.  Hold bumetanide and dapaglifloxin (farxiga) if you develop diarrhea (more than 3 loose bowel movements in a day) or  episodes of vomiting. Whenever you hold bumetanide you need to hold your potassium replacement as well. Resume when the symptoms resolve. Whenever you hold any medication let your heart doctor know about this.    Visit the following web site for information on diet in patients with kidney disease:  https://www.kidney.org/nutrition            RTC 4 months

## 2021-09-06 DIAGNOSIS — N1832 Chronic kidney disease, stage 3b (HCC): Principal | ICD-10-CM

## 2021-09-14 ENCOUNTER — Encounter: Admit: 2021-09-14 | Discharge: 2021-09-14 | Payer: MEDICARE

## 2021-09-14 NOTE — Telephone Encounter
Called pts RN back to ask further questions. Pt is not experiencing any symptoms other than 2+ pitting edema. Inquired about BMP. They will fax results to [name] once completed. Will wait on lab work.

## 2021-09-14 NOTE — Telephone Encounter
Received call from Logan Bright with [facility] [phone] reporting pts weight yesterday was 210 lbs and today is 215 lbs. Pt has 2+ pitting edema, is taking bumex 6 mg AM and 4 mg PM

## 2021-09-24 ENCOUNTER — Encounter: Admit: 2021-09-24 | Discharge: 2021-09-24 | Payer: MEDICARE

## 2021-09-24 NOTE — Telephone Encounter
Rec'd lab results on pt from [facility]. HF clinic ordered weekly BMP. Results forwarded to their office at fax number [phone].

## 2021-10-09 ENCOUNTER — Encounter: Admit: 2021-10-09 | Discharge: 2021-10-09 | Payer: MEDICARE

## 2021-10-09 MED ORDER — BUMETANIDE 2 MG PO TAB
6 mg | ORAL_TABLET | Freq: Two times a day (BID) | ORAL | 3 refills | Status: AC
Start: 2021-10-09 — End: ?

## 2021-10-09 NOTE — Telephone Encounter
Called and spoke to pts RN, Amy for update to increase bumex to 6 mg BID. Amy VU. Requested last BMP on patient to be faxed to [name].

## 2021-10-09 NOTE — Telephone Encounter
Message received from [place] with [facility] 10/07/21 at 1114 reporting patient's weight 10/06/2021 was 212 lbs and 10/07/2021 his weight was 217 lbs. She is requesting call back at  Ph) [phone]. Returned call to nurse. Spoke to nurse, Milinda Hirschfeld. She informed pt's weight today was 217.0. She did see pt today and states pt has 3+ lymphedema and his legs are wrapped with ace wraps. She did not notice any SOA but states pt is very fatigued. B/P this am was 96/46 HR 61. Sp02 94%. Informed her we will forward to his care team for further follow up.

## 2021-10-10 ENCOUNTER — Ambulatory Visit: Admit: 2021-10-10 | Discharge: 2021-10-11 | Payer: MEDICARE

## 2021-10-10 ENCOUNTER — Encounter: Admit: 2021-10-10 | Discharge: 2021-10-10 | Payer: MEDICARE

## 2021-10-10 DIAGNOSIS — E785 Hyperlipidemia, unspecified: Secondary | ICD-10-CM

## 2021-10-10 DIAGNOSIS — I2510 Atherosclerotic heart disease of native coronary artery without angina pectoris: Secondary | ICD-10-CM

## 2021-10-10 DIAGNOSIS — I255 Ischemic cardiomyopathy: Secondary | ICD-10-CM

## 2021-10-10 DIAGNOSIS — I5022 Chronic systolic (congestive) heart failure: Secondary | ICD-10-CM

## 2021-10-10 DIAGNOSIS — I480 Paroxysmal atrial fibrillation: Secondary | ICD-10-CM

## 2021-10-10 DIAGNOSIS — I5042 Chronic combined systolic (congestive) and diastolic (congestive) heart failure: Secondary | ICD-10-CM

## 2021-10-10 DIAGNOSIS — Z8679 Personal history of other diseases of the circulatory system: Secondary | ICD-10-CM

## 2021-10-10 DIAGNOSIS — R55 Syncope and collapse: Secondary | ICD-10-CM

## 2021-10-10 DIAGNOSIS — Z95810 Presence of automatic (implantable) cardiac defibrillator: Secondary | ICD-10-CM

## 2021-10-10 DIAGNOSIS — T148XXA Other injury of unspecified body region, initial encounter: Secondary | ICD-10-CM

## 2021-10-10 DIAGNOSIS — I10 Essential (primary) hypertension: Secondary | ICD-10-CM

## 2021-10-10 DIAGNOSIS — M1990 Unspecified osteoarthritis, unspecified site: Secondary | ICD-10-CM

## 2021-10-10 DIAGNOSIS — N289 Disorder of kidney and ureter, unspecified: Secondary | ICD-10-CM

## 2021-10-10 DIAGNOSIS — Z955 Presence of coronary angioplasty implant and graft: Secondary | ICD-10-CM

## 2021-10-10 NOTE — Progress Notes
Date of Service: 10/10/2021    Logan Bright Devaun Loudy is a 86 y.o. male.       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 ~ 1.6-2, PAFib not on AC due to hx of hematuria, hx of cryptogenic stroke in 2017,  gout, mild cognitive impairment, Hypothyroidism and elevated BMI. He follows with Dr. Barry Dienes.     He was admitted from SNF with hypervolemia/weight gain in May and twice in June 2023 due to non compliance with diet and fluid intake which has since been corrected.      He was last seen by me on 09/04/21 at which time bumex was increased to 6 mg in the morning and 4 mg afternoon and potassium to 60 mEq BID. Orders placed for weekly BMP at his facility and to place ACE wraps on in AM and off PM. Furoscix patient assistance approved and daughter has cartridges at her home and has taken one to the facility and is available when needed.     Facility called on 09/14/21 reporting weight gain. Labs were to be drawn. Not done, called again 9/10 with further weight gain, no labs received. Bumex increased to 6 mg BID and he was scheduled to be seen.     He presented today stating that he was feeling well last week, but poorly over the weekend with increased SOB and fatigue. No energy at all yesterday and did not feel like eating. He feels much better today after increase in bumex yesterday and another provider initiated treatment with antibiotics for cellulitis. He denies CP, dizziness, palpitations. He has ongoing LE swelling and legs are being wrapped with ACE wraps as instructed. He has been walking without difficulty. He is complaining about weekly blood draws of which we are not receiving results. He sleeps poorly. Daughter states weight had been stable ~ 211 pounds until end of last week when he was up to 217 pounds, down 4 pounds since increasing diuretic dose.        Vitals:    10/10/21 0948   BP: 115/57   BP Source: Arm, Right Upper   Pulse: 72   SpO2: 98%   O2 Device: None (Room air)   PainSc: Zero   Weight: 97.1 kg (214 lb)   Height: 182.9 cm (6')     Body mass index is 29.02 kg/m?[name]   Wt Readings from Last 3 Encounters:   10/10/21 97.1 kg (214 lb)   09/05/21 (P) 95.4 kg (210 lb 6.4 oz)   09/04/21 95.9 kg (211 lb 6.4 oz)        Past Medical History  Patient Active Problem List    Diagnosis Date Noted   ? Fluid overload 06/18/2021   ? Hypovitaminosis D 07/03/2020   ? Colitis 05/26/2020   ? Hematuria 05/26/2020   ? Acute kidney injury superimposed on CKD (HCC) 05/26/2020   ? Coronary artery disease involving native coronary artery of native heart without angina pectoris 05/23/2020   ? Diverticulitis 05/22/2020   ? Chronic combined systolic and diastolic CHF, NYHA class 3 (HCC) 03/30/2020   ? Cognitive impairment 03/30/2020   ? Cellulitis of scrotum 03/30/2020   ? Stage 3b chronic  kidney disease (CKD) (HCC) 03/30/2020   ? Cognitive dysfunction 03/09/2020   ? Shortness of breath 10/29/2019   ? Heart failure exacerbated by sotalol (HCC) 10/27/2019   ? Heart failure (HCC) 07/01/2019   ? History of polycythemia 12/14/2018   ? Hyponatremia 09/04/2018   ? Hypothyroidism 09/04/2018   ? Paroxysmal atrial fibrillation (HCC) 04/07/2018   ? Cryptogenic stroke [facility]) 02/06/2016     2017 - Eye doctor told him he'd had an old stroke after doing an eye exam    08/06/16 Carotid Ultrasound [facility]): Bilateral antegrade vertebral artery flow. No hemodynamically significant common or internal carotid artery stenosis.     ? Closed trimalleolar fracture of right ankle 11/12/2015   ? Debility 06/10/2013   ? Recurrent ventricular tachycardia w/ 2 VT ablations 04/2013 05/11/2013     Sustained VT w/ multiple ICD tachypacing and Shocks, failed amiodaron   05/05/13 & 05/12/13  VT ablations [facility] w/ impella support 4/15, Home off Coreg and losartan d/t post procedure hematoma, hypotension     ? AKI (acute kidney injury) (HCC) 04/16/2013   ? 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 Ermelinda Das.        ? Ischemic cardiomyopathy      6/02 - infarct 2 stents .  [facility]            4/04 - Surveillance stress test at [facility]: reportedly negative.  4/05 - Stress thallium study: EF 30%, LAD and RCA defects, elevated lung uptake.- Echo doppler: EF 25%, amt& inf hypokinesis,  apical akinesis, poss  thrombus, mild valvular Regurg       5/06 - Stress thallium: EF 22%, no signficant interval change.  08/2011 - Dobut echo.  EF 10-15%. Antero-apical akinesis/dyskinesis.  No ischemia.  04/2013 RegadenThall EF 23% Extensive Anterior infarct, no ischemia, unchanged from prior     ? Chronic HFrEF (heart failure with reduced ejection fraction) (HCC)       2003 - Status post ICD implant.    4/05 - EF 25-30% per stress thallium and echo doppler.  08/2011 - Echo EF 10-15%     ? Neurologic cardiac syncope        3/05 - Developed syncope while at restaurant, taken to ED.  Symptoms of diaphoresis                    and lightheadedness.          4/05 - Head up tilt study: positive for inducing hypotension 72/49. Produced symptoms                    c/w previous syncopal episode.     ? HTN (hypertension)                 2002 - Diagnosis established.     ? Dyslipidemia         09/26/2008  03/14/2009  02/26/2011  04/17/2012  03/31/2013   LDL 107 (H) 141 (H) 80 108 (H) 104 (H)   04/2013: Refuses Statins understands that they reduce recurrent CV events, still declines.      ? Gout          ROS   * see HPI      Physical Exam    Alert and appropriate, difficulty answering questions, limited by dementia  JVD 7-8  cm at 90 degrees with (-) HJR  No abdominal distention  2-3+ bil LE edema, ACE wraps in place to knees, redness LE bilaterally to knees, denies tenderness  Breathing comfortably without wheezing, rales or rhonchi  Auscultated heart with normal S1S2, regular rhythm, no obvious murmur   Able to walk into clinic unassisted       Cardiovascular Studies  Echo 06/19/21  1. Severely impaired left ventricular systolic function with an EF of 20%.  The left ventricle is severely dilated, the end-diastolic dimension was 6.5 cm, however the end-diastolic volume was 317 mL with an index of 146 mL.  Squared.  2. 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  3. Unable to assess diastolic function.  4. The RV is probably dilated with preserved function.  5. Right-sided pacer leads were noted.  6. There is nonspecific mitral thickening with probably moderate MR.  7. Mild TR.  8. Aortic valve sclerosis without stenosis.  Mild to moderate AI.[name]  9. Unable to calculate a peak pulmonary pressure.  10. The IVC was consistent with an elevated right atrial pressure.  11. Aortic root was mildly dilated, the ascending aorta was moderately dilated.  12. No pericardial effusion.    ?  Prior study was obtained on 05/24/2020.  It demonstrated an EF of 20%, the end-diastolic dimension was 7.2 cm, the end-diastolic volume was 193 mL with an index of 98 mL/m?, the RV function was preserved, right-sided device leads were noted, there is nonspecific mitral valve thickening, there is aortic valve sclerosis without stenosis, Doppler interrogation was not performed, the aortic root was dilated, the IVC is consistent with a normal central venous pressure, there is no pericardial effusion.  ?  The LV systolic function remains similar in both studies.  The akinetic areas on the prior study have a somewhat more aneurysmal change on the current study.  Otherwise the 2 studies are similar.    PYP 09/07/18:  This study is negative and not suggestive of ATTR cardiac amyloidosis.  A negative exam does not exclude AL cardiac amyloidosis.    MPI 04/14/18:  This study is markedly abnormal.  Extensive, severely reduced mid to apical uptake defects in all cardiac segments, partially fixed.  Suggestive of extensive prior myocardial injury with mild peri-infarct ischemia (in the mid segments).this is unchanged compared to prior ADAC study (04/21/2012).  Current summed stress score is 55 (51 on prior ADAC study). Dilated left ventricle with severely reduced left ventricular ejection fraction, EF of 18% (20% on prior study).  High risk prognostic indicators for annual cardiovascular mortality remain present. Nonischemic response to ECG.          Problems Addressed Today  Encounter Diagnoses   Name Primary?   ? Paroxysmal atrial fibrillation (HCC) Yes   ? Chronic systolic heart failure (HCC)    ? Coronary artery disease involving native coronary artery of native heart without angina pectoris    ? Chronic combined systolic and diastolic CHF, NYHA class 3 (HCC)    ? Ischemic cardiomyopathy    ? Primary hypertension        Assessment and Plan     HFrEF related to ICM  - Last LVEF 20% from echo 06/19/21  - NYHA class III Stage C  - Dry weight: 195 pounds, up to 214 in clinic today  - amyloid workup negative  - hypervolemic on exam today with  2-3+ lower extremity edema to his knees  > PLAN:   - patient assistance for Furoscix was approved and daughter has cartridges at home to use when needed, facility has one cartridge  - will continue with bumex 6 mg BID for now as symptoms improved, renal function and K+ stable on increased dose  - initiated screening for CardioMems and sent daughter home with information to review      GDMT Current Dose Changes made at visit   BB Toprol XL 12.5 mg daily     ACEI/ARB/ARNI No ACE/ARB/ARNI due to renal function     Aldosterone antagonist No MRA due to renal function      SGLT2i Farxiga 10 mg daily      Hydralazine/Nitrate NA Ivabradine NA    HRMT  CRT-D    Diuretics Bumex 6 mg BID    Cardiac rehab NA as resides in a SNF    Candidacy for Adv HF therapies Not a candidate due to age      Coronary artery disease  Hx of PCI in 2002 [facility]   Details unknown  Denies anginal Symptoms  ?  Permanent Atrial Fibrillation  CHA2DS2VASC is elevated.   Not on OAC due to hx of hematuria.   - Currently on amiodarone 200 mg daily and Toprol XL 12.5 mg daily  Managed by Dr. Barry Dienes.   ?  Chronic Kidney Disease Stage IIIB  BL Cr. ~ 1.6-2 Suspect new baseline is closer to 2-2.3.  Last creat 1.9 from 09/04/21  POC creat 2.2 today with K+ stable at 4.3  ?  VT s/p ICD 2010 upgraded to CRT-D 2020  S/p VT ablation in 2015  Treated with Amiodarone    Hypertension  BP today 115/57  Currently on GDMT as per above       DM Type II   >  Lab Results   Component Value Date/Time    HGBA1C 6.6 (H) 03/30/2020 02:00 AM    HGBA1C 6.6 (H) 10/28/2019 04:36 AM    HGBA1C 6.6 (H) 03/31/2013 06:35 AM        Dyslipidemia  Not on STATINS due to patient refusal per chart review.        Lab Results   Component Value Date/Time    CHOL 81 03/30/2020 04:12 AM    TRIG 63 03/30/2020 04:12 AM    HDL 39 (L) 03/30/2020 04:12 AM    LDL 41 03/30/2020 04:12 AM    VLDL 13 03/30/2020 04:12 AM    NONHDLCHOL 42 03/30/2020 04:12 AM    CHOLHDLC 4 04/17/2012 12:00 AM               Current Medications (including today's revisions)  ? acetaminophen (TYLENOL EXTRA STRENGTH) 500 mg tablet Take one tablet by mouth every 6 hours as needed. Max of 4,000 mg of acetaminophen in 24 hours.   ? allopurinoL (ZYLOPRIM) 300 mg tablet Take one tablet by mouth daily. Take with food.   ? amiodarone (CORDARONE) 200 mg tablet Take one tablet by mouth daily. Take with food.   ? bumetanide (BUMEX) 2 mg tablet Take three tablets by mouth twice daily. Take 6 mg in the morning and 4 mg in the afternoon  Indications: accumulation of fluid resulting from chronic heart failure   ? calcium carbonate (TUMS E-X) 300 mg (750 mg) chewable tablet Chew two tablets by mouth at bedtime daily.   ? CHOLEcalciferoL (vitamin D3) (VITAMIN D-3) 5000 unit tablet Take one tablet by mouth daily.   ?  clotrimazole (LOTRIMIN) 1 % topical cream Apply  topically to affected area twice daily. Apply to affect areas of feet for tinea pedis   ? dapagliflozin propanediol (FARXIGA) 10 mg tablet Take one tablet by mouth daily.   ? diphenhydramine HCl (BENADRYL ALLERGY PO) Take 50 mg by mouth daily.   ? docusate (COLACE) 100 mg capsule Take one capsule by mouth twice daily.   ? finasteride (PROSCAR) 5 mg tablet Take one tablet by mouth daily.   ? hydrocortisone (CORTIZONE-10) 1 % topical cream Apply  topically to affected area twice daily. Apply to face/groin/underarms   ? levothyroxine (SYNTHROID) 50 mcg tablet TAKE 1 TABLET BY MOUTH EVERY DAY 30 MINUTES BEFORE BREAKFAST   ? menthol/camphor (BIOFREEZE TP) Apply  topically to affected area three times daily as needed (low back pain).   ? methylPREDNISolone (MEDROL) 4 mg tablet Take one tablet by mouth every morning. Take with food.  Take as directed per [facility]   ? metoprolol XL (TOPROL XL) 25 mg extended release tablet Take one-half tablet by mouth daily. HOLD IF SBP < 100   ? polyethylene glycol 3350 (MIRALAX) 17 g packet Take one packet by mouth daily as needed (Constipation - Second Line).   ? potassium chloride SR (KLOR-CON M20) 20 mEq tablet Take three tablets by mouth twice daily. Take with a meal and a full glass of water.   ? senna/docusate (SENOKOT-S) 8.6/50 mg tablet Take one tablet by mouth daily as needed (Constipation - First Line).   ? tamsulosin (FLOMAX) 0.4 mg capsule Take one capsule by mouth daily after breakfast. Do not crush, chew or open capsules. Take 30 minutes following the same meal each day.   ? thiamine (VITAMIN B-1) 100 mg tablet Take one tablet by mouth daily.   ? triamcinolone acetonide (KENALOG) 0.1 % topical ointment Apply  topically to affected area twice daily. Use for the rest of your body besides face/groin/underarms            Total Time Today was 40 minutes in the following activities: Preparing to see the patient, Obtaining and/or reviewing separately obtained history, Performing a medically appropriate examination and/or evaluation, Counseling and educating the patient/family/caregiver, Ordering medications, tests, or procedures, Referring and communication with other health care professionals (when not separately reported) and Documenting clinical information in the electronic or other health record     Sue Lush,  CHFN, APRN, NP-C  Advanced Heart Failure APP  [facility]      Collaborating physician Kathleen Argue, MD

## 2021-10-10 NOTE — Patient Instructions
Med changes today:  Continue bumex 6 mg twice a day  Procedures planned:   Please log your weight, blood pressure, and heart rate and bring to all visits.  STOP weekly lab draws.   Continue with 2 liter/day or 64 ounces/day fluid restriction and 2 gm (2000 mg) sodium restriction/day  Return to see Fara Boros, NP in 1 month and will get POC labs in clinic    Please call the office with any questions or concerns [phone] (nurse triage).  To schedule or change an appointment call [phone].  Fax: [phone]      Sue Lush, CHFN, APRN-NP  Nurse Practitioner    Advanced Heart Failure - Silver Team  Kathleen Argue, MD   Bing Matter, MD  Nurse Practitioners: Satira Anis, Fara Boros, Eyvonne Left and Gates Rigg  RNs: Mick Sell, Parker, Trina Ao, Vivien Rota and [facility] at [facility]          Your Heart Failure Symptom Awareness and Action Plan  Every Day Action Plan  Weigh yourself in the morning before breakfast. Write it down and compare it to yesterday's weight.  Take your medicine, as prescribed. Please call if you have concerns about the side effects, cost or refills.  Check for worsened swelling in your feet, ankles and stomach  Follow a 2 gm (2000 mg) salt diet.  Keep all healthcare appointments    Green Zone   Good! Symptoms are under control No shortness of breath  No increase in ankle swelling  No weight gain  No chest pain  No change in your usual activity  Continue to follow your everyday action plan.    Yellow Zone  If you have any of these symptoms, please call the heart failure nurses: [phone] Increased shortness of breath with activity  Weight gain of 3 pounds in one day or 5 pounds in a week  Increased swelling in your ankles or legs  Increased swelling in your stomach  Increasing fatigue  You may need an adjustment of your medications.    Red Zone  These are urgent symptoms. Please call the heart failure nurses: [phone] Shortness of breath at rest or waking up at night feeling short of breath or coughing  Increased number of pillows used or needing to sit upright to sleep  Chest tightness at rest  Dizziness, lightheadedness or feeling faint You need to schedule an appointment   Emergency Zone - call 911 Worsening chest tightness or pain that is not relieved by medication  Severe shortness of breath and a cough with pink, frothy sputum

## 2021-10-10 NOTE — Telephone Encounter
-----   Message from [place] sent at 10/10/2021 10:09 AM CDT -----  Abbe Amsterdam, we are not the ones that have been working with Mr. Alcorn regarding his Furoscix. Looking at his notes, it looks like Regulatory affairs officer has been working on this and they are the ones who received his MAP information. They can be contacted at [phone]. Thank you  ----- Message -----  From: Lyla Glassing, RN  Sent: 10/10/2021   8:09 AM CDT  To: [place] Green    Good morning Kechi! This pt's daughter submitted MAP paperwork for Furoscix on 8/7 and will be seen in clinic today. Angela Nevin was wondering if we had an update on when/if he is eligible and approved for this medication. Thank you for your help!

## 2021-10-10 NOTE — Progress Notes
Card[facility] Primary Screening                                  Date: 10/10/2021  Patient Referred by: Sandy Salaam APRN   Insurance: medicare   Heart failure admission within last 12 months OR Elevated BNP (Elevated BNP is defined by the provider for commercial implants and does not have to be drawn within a certain timeframe) NT Pro BNP 2462    Hospitalization Dates:   Hospital Name:   IV Diuretics During Admission?:  Yes        NYHA HF Class II or Class III  Yes  NYHA CLASS: III        Able to take dual antiplatelet or anticoagulants for 1 month post implant Yes   Additional Screening if all above are yes        Patient with active infection No        History of recurrent PE or DVT  (If yes, does patient have IVC filter?) No        Unable to tolerate RHC No        GFR < 25 mL/min and non-responsive to diuretics or on chronic renal dialysis No        Congenital heart disease or mechanical right heart valve No        Unrepaired severe valvular disease? No        CRT device implanted within last 3 months?          Date of Implant:  No        BMI > 35, axillary chest circumference > 165 cm                         cm  No        Patient unwilling to transmit device date or concerns with patient compliance No          Please ask patient the following:        Most recent Echo date: 06/19/21     EF:20%       Patient manages own medications?  (If no, who manages pt's medications?) Yes       Patient/caregiver able to make medication interventions via phone?  Yes     CardioMEMS Pre-Implant Patient Education                                   Education Completed by:    Video: How the CardioMEMs HF System Works    [employer]

## 2021-10-10 NOTE — Telephone Encounter
Confirmed with Lattie Haw at [facility] that patient's MAP has been approved.

## 2021-10-15 ENCOUNTER — Encounter: Admit: 2021-10-15 | Discharge: 2021-10-15 | Payer: MEDICARE

## 2021-10-16 ENCOUNTER — Encounter: Admit: 2021-10-16 | Discharge: 2021-10-16 | Payer: MEDICARE

## 2021-11-06 ENCOUNTER — Encounter: Admit: 2021-11-06 | Discharge: 2021-11-06 | Payer: MEDICARE

## 2021-11-06 NOTE — Progress Notes
Date of Service: 11/07/2021    Logan Bright is a 86 y.o. male.       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 ~ 1.6-2, PAFib not on AC due to hx of hematuria, hx of cryptogenic stroke in 2017,  gout, mild cognitive impairment, Hypothyroidism and elevated BMI. He follows with Dr. Barry Dienes.     He was admitted from SNF with hypervolemia/weight gain in May and twice in June 2023 due to non compliance with diet and fluid intake which has since been corrected.      He was last seen by me on 10/10/21 at which time he was continued on previously increased bumex 6 mg BID. Furoscix patient assistance approved and daughter has cartridges at her home and has taken one to the facility and is available when needed.     Daughter called on 10/16/21 reporting that he fell and hit his head and was taken to [facility] ER. He tested + for COVID and was being treated for fluid on the lungs and legs/feet as well.     Logan Bright is a 86 y.o. male with h/o chronic systolic and diastolic heart failure EF 20%, CKD IV Cr 2 who was admitted on 10/15/2021 for fall. Patient with acute pulmonary insufficiency 2L NC with COVID 19 and acute on chronic mixed heart failure. IV lasix on 9/19 and cardiology transitioned back to chronic po bumex 9/20. Patient weaned to room air 9/20 with no further hypoxia and denies any COVID symptoms. Received 3 days of dexamethasone, remdesivir which were discontinued on day of discharge. No evidence of cellulitis/soft tissue infection so discontinued IV ceftriaxone 9/21. Discharge back to NH with HH.       He presented today stating that he thinks his blood pressure was too low and that caused the fall.  Of note he was found to be COVID-positive at the facility prior to his fall.  The fall was not witnessed at the facility, but no evidence of LOC.  Facility records indicate that his weight was 198 pounds after discharge from [facility] and has gradually increased to 202 pounds today (noting that there was an weight of 188 pounds that we believe was entered in error).  Patient denies shortness of breath, dizziness, further falls, chest pain, orthopnea or PND.  He was seen today with a caregiver from the facility as his daughter was not available to come today and she states that he does appear to be short of breath when he is walking all over the building.       Vitals:    11/07/21 1057 11/07/21 1100   BP: 106/58    BP Source: Arm, Right Upper    Pulse: 84    SpO2: 96%    O2 Device:  None (Room air)   PainSc: Zero Zero   Weight: 94.7 kg (208 lb 12.8 oz)    Height: 182.9 cm (6')      Body mass index is 28.32 kg/m?[name]   Wt Readings from Last 3 Encounters:   11/07/21 94.7 kg (208 lb 12.8 oz)   10/10/21 97.1 kg (214 lb)   09/05/21 (P) 95.4 kg (  210 lb 6.4 oz)        Past Medical History  Patient Active Problem List    Diagnosis Date Noted   ? Fluid overload 06/18/2021   ? Hypovitaminosis D 07/03/2020   ? Colitis 05/26/2020   ? Hematuria 05/26/2020   ? Acute kidney injury superimposed on CKD  05/26/2020   ? Coronary artery disease involving native coronary artery of native heart without angina pectoris 05/23/2020   ? Diverticulitis 05/22/2020   ? Chronic combined systolic and diastolic CHF, NYHA class 3 (HCC) 03/30/2020   ? Cognitive impairment 03/30/2020   ? Cellulitis of scrotum 03/30/2020   ? Stage 3b chronic kidney disease (CKD) (HCC) 03/30/2020   ? Cognitive dysfunction 03/09/2020   ? Shortness of breath 10/29/2019   ? Heart failure exacerbated by sotalol (HCC) 10/27/2019   ? Heart failure (HCC) 07/01/2019   ? History of polycythemia 12/14/2018   ? Hyponatremia 09/04/2018   ? Hypothyroidism 09/04/2018   ? Atrial fibrillation, permanent (HCC) 04/07/2018   ? Cryptogenic stroke [facility]) 02/06/2016     2017 - Eye doctor told him he'd had an old stroke after doing an eye exam    08/06/16 Carotid Ultrasound [facility]): Bilateral antegrade vertebral artery flow. No hemodynamically significant common or internal carotid artery stenosis.     ? Closed trimalleolar fracture of right ankle 11/12/2015   ? Debility 06/10/2013   ? Recurrent ventricular tachycardia w/ 2 VT ablations 04/2013 05/11/2013     Sustained VT w/ multiple ICD tachypacing and Shocks, failed amiodaron   05/05/13 & 05/12/13  VT ablations [facility] w/ impella support 4/15, Home off Coreg and losartan d/t post procedure hematoma, hypotension     ? AKI (acute kidney injury) (HCC) 04/16/2013   ? 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 Ermelinda Das.        ? Ischemic cardiomyopathy      6/02 - infarct 2 stents .  [facility]            4/04 - Surveillance stress test at [facility]: reportedly negative.  4/05 - Stress thallium study: EF 30%, LAD and RCA defects, elevated lung uptake.- Echo doppler: EF 25%, amt& inf hypokinesis,  apical akinesis, poss  thrombus, mild valvular Regurg       5/06 - Stress thallium: EF 22%, no signficant interval change.  08/2011 - Dobut echo.  EF 10-15%. Antero-apical akinesis/dyskinesis.  No ischemia.  04/2013 RegadenThall EF 23% Extensive Anterior infarct, no ischemia, unchanged from prior     ? Chronic HFrEF (heart failure with reduced ejection fraction) (HCC)       2003 - Status post ICD implant.    4/05 - EF 25-30% per stress thallium and echo doppler.  08/2011 - Echo EF 10-15%     ? Neurologic cardiac syncope        3/05 - Developed syncope while at restaurant, taken to ED.  Symptoms of diaphoresis                    and lightheadedness.          4/05 - Head up tilt study: positive for inducing hypotension 72/49. Produced symptoms c/w previous syncopal episode.     ? HTN (hypertension)                 2002 - Diagnosis  established.     ? Dyslipidemia         09/26/2008  03/14/2009  02/26/2011  04/17/2012  03/31/2013   LDL 107 (H) 141 (H) 80 108 (H) 104 (H)   04/2013: Refuses Statins understands that they reduce recurrent CV events, still declines.      ? Gout          ROS * see HPI      Physical Exam  Alert and appropriate, difficulty answering questions, limited by dementia  JVD 7-8 cm at 90 degrees with (-) HJR  No abdominal distention  2+ bil LE edema,  redness LE bilaterally to knees, mildly tender to touch  Breathing comfortably without wheezing, rales or rhonchi  Auscultated heart with normal S1S2, regular rhythm, no obvious murmur   Rash noted on bilateral lower extremities and lower abdomen with evidence of scratching (patient states that it has been there for months)  Able to walk into clinic unassisted       Cardiovascular Studies  Echo 06/19/21  1. Severely impaired left ventricular systolic function with an EF of 20%.  The left ventricle is severely dilated, the end-diastolic dimension was 6.5 cm, however the end-diastolic volume was 317 mL with an index of 146 mL.  Squared.  2. 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  3. Unable to assess diastolic function.  4. The RV is probably dilated with preserved function.  5. Right-sided pacer leads were noted.  6. There is nonspecific mitral thickening with probably moderate MR.  7. Mild TR.  8. Aortic valve sclerosis without stenosis.  Mild to moderate AI.[name]  9. Unable to calculate a peak pulmonary pressure.  10. The IVC was consistent with an elevated right atrial pressure.  11. Aortic root was mildly dilated, the ascending aorta was moderately dilated.  12. No pericardial effusion.    ?  Prior study was obtained on 05/24/2020.  It demonstrated an EF of 20%, the end-diastolic dimension was 7.2 cm, the end-diastolic volume was 193 mL with an index of 98 mL/m?, the RV function was preserved, right-sided device leads were noted, there is nonspecific mitral valve thickening, there is aortic valve sclerosis without stenosis, Doppler interrogation was not performed, the aortic root was dilated, the IVC is consistent with a normal central venous pressure, there is no pericardial effusion.  ?  The LV systolic function remains similar in both studies.  The akinetic areas on the prior study have a somewhat more aneurysmal change on the current study.  Otherwise the 2 studies are similar.    PYP 09/07/18:  This study is negative and not suggestive of ATTR cardiac amyloidosis.  A negative exam does not exclude AL cardiac amyloidosis.    MPI 04/14/18:  This study is markedly abnormal.  Extensive, severely reduced mid to apical uptake defects in all cardiac segments, partially fixed.  Suggestive of extensive prior myocardial injury with mild peri-infarct ischemia (in the mid segments).this is unchanged compared to prior ADAC study (04/21/2012).  Current summed stress score is 55 (51 on prior ADAC study). Dilated left ventricle with severely reduced left ventricular ejection fraction, EF of 18% (20% on prior study).  High risk prognostic indicators for annual cardiovascular mortality remain present. Nonischemic response to ECG.          Problems Addressed Today  Encounter Diagnoses   Name Primary?   ? Chronic HFrEF (heart  failure with reduced ejection fraction) (HCC) Yes   ? Coronary artery disease involving native coronary artery of native heart without angina pectoris    ? Ischemic cardiomyopathy    ? Atrial fibrillation, permanent (HCC)    ? Hypertension, unspecified type        Assessment and Plan     HFrEF related to ICM  - Last LVEF 20% from echo 06/19/21  - NYHA class III Stage C  - Dry weight: 195 pounds, up to 214 in clinic today  - amyloid workup negative  - hypervolemic on exam today with 2+ lower extremity edema to his knees  > PLAN:   -We will continue Bumex 6 mg twice daily along with potassium 60 mill equivalents twice daily  -Infuse one dose of Furoscix SQ tomorrow with additional 40 mEq potassium x 1 dose  - Repeat BMP on the day following furoscix infusion      GDMT Current Dose Changes made at visit   BB Toprol XL 12.5 mg daily     ACEI/ARB/ARNI No ACE/ARB/ARNI due to renal function     Aldosterone antagonist No MRA due to renal function      SGLT2i Farxiga 10 mg daily      Hydralazine/Nitrate NA     Ivabradine NA    HRMT  CRT-D    Diuretics Bumex 6 mg BID    Cardiac rehab NA as resides in a SNF    Candidacy for Adv HF therapies Not a candidate due to age      Coronary artery disease  Hx of [facility] in 2002 [facility]   Details unknown  Denies anginal Symptoms  ?  Permanent Atrial Fibrillation  CHA2DS2VASC is elevated.   Not on OAC due to hx of hematuria.   - Currently on amiodarone 200 mg daily and Toprol XL 12.5 mg daily  Managed by Dr. Barry Dienes.   ?  Chronic Kidney Disease Stage IIIB  BL Cr. ~ 1.6-2 Suspect new baseline is closer to 2-2.3.  Last creat 1.87 from 10/18/21  POC creat 1.8 today  ?  VT s/p ICD 2010 upgraded to CRT-D 2020  S/p VT ablation in 2015  Treated with Amiodarone    Hypertension  BP today 106/58  Currently on GDMT as per above       DM Type II   >  Lab Results   Component Value Date/Time    HGBA1C 6.6 (H) 03/30/2020 02:00 AM    HGBA1C 6.6 (H) 10/28/2019 04:36 AM    HGBA1C 6.6 (H) 03/31/2013 06:35 AM        Dyslipidemia  Not on STATINS due to patient refusal per chart review.        Lab Results   Component Value Date/Time    CHOL 81 03/30/2020 04:12 AM    TRIG 63 03/30/2020 04:12 AM    HDL 39 (L) 03/30/2020 04:12 AM    LDL 41 03/30/2020 04:12 AM    VLDL 13 03/30/2020 04:12 AM    NONHDLCHOL 42 03/30/2020 04:12 AM    CHOLHDLC 4 04/17/2012 12:00 AM               Current Medications (including today's revisions)  ? acetaminophen (TYLENOL EXTRA STRENGTH) 500 mg tablet Take one tablet by mouth every 6 hours as needed. Max of 4,000 mg of acetaminophen in 24 hours.   ? allopurinoL (ZYLOPRIM) 300 mg tablet Take one tablet by mouth daily. Take with food.   ? amiodarone (CORDARONE) 200 mg  tablet Take one tablet by mouth daily. Take with food.   ? bumetanide (BUMEX) 2 mg tablet Take three tablets by mouth twice daily. Indications: accumulation of fluid resulting from chronic heart failure   ? calcium carbonate (TUMS E-X) 300 mg (750 mg) chewable tablet Chew two tablets by mouth at bedtime daily.   ? CHOLEcalciferoL (vitamin D3) (VITAMIN D-3) 5000 unit tablet Take one tablet by mouth daily.   ? clotrimazole (LOTRIMIN) 1 % topical cream Apply  topically to affected area twice daily. Apply to affect areas of feet for tinea pedis   ? dapagliflozin propanediol (FARXIGA) 10 mg tablet Take one tablet by mouth daily.   ? diphenhydramine HCl (BENADRYL ALLERGY PO) Take 50 mg by mouth daily.   ? docusate (COLACE) 100 mg capsule Take one capsule by mouth twice daily.   ? finasteride (PROSCAR) 5 mg tablet Take one tablet by mouth daily.   ? hydrocortisone (CORTIZONE-10) 1 % topical cream Apply  topically to affected area twice daily. Apply to face/groin/underarms   ? levothyroxine (SYNTHROID) 50 mcg tablet TAKE 1 TABLET BY MOUTH EVERY DAY 30 MINUTES BEFORE BREAKFAST   ? menthol/camphor (BIOFREEZE TP) Apply  topically to affected area three times daily as needed (low back pain).   ? methylPREDNISolone (MEDROL) 4 mg tablet Take one tablet by mouth every morning. Take with food.  Take as directed per [facility]   ? metoprolol XL (TOPROL XL) 25 mg extended release tablet Take one-half tablet by mouth daily. HOLD IF SBP < 100   ? polyethylene glycol 3350 (MIRALAX) 17 g packet Take one packet by mouth daily as needed (Constipation - Second Line).   ? potassium chloride SR (KLOR-CON M20) 20 mEq tablet Take three tablets by mouth twice daily. Take with a meal and a full glass of water.   ? senna/docusate (SENOKOT-S) 8.6/50 mg tablet Take one tablet by mouth daily as needed (Constipation - First Line).   ? tamsulosin (FLOMAX) 0.4 mg capsule Take one capsule by mouth daily after breakfast. Do not crush, chew or open capsules. Take 30 minutes following the same meal each day.   ? thiamine (VITAMIN B-1) 100 mg tablet Take one tablet by mouth daily.   ? triamcinolone acetonide (KENALOG) 0.1 % topical ointment Apply  topically to affected area twice daily. Use for the rest of your body besides face/groin/underarms            Total Time Today was 40 minutes in the following activities: Preparing to see the patient, Obtaining and/or reviewing separately obtained history, Performing a medically appropriate examination and/or evaluation, Counseling and educating the patient/family/caregiver, Ordering medications, tests, or procedures, Referring and communication with other health care professionals (when not separately reported) and Documenting clinical information in the electronic or other health record     Sue Lush,  CHFN, APRN, NP-C  Advanced Heart Failure APP  [facility]      Collaborating physician Kathleen Argue, MD

## 2021-11-07 ENCOUNTER — Encounter: Admit: 2021-11-07 | Discharge: 2021-11-07 | Payer: MEDICARE

## 2021-11-07 ENCOUNTER — Ambulatory Visit: Admit: 2021-11-07 | Discharge: 2021-11-07 | Payer: MEDICARE

## 2021-11-07 DIAGNOSIS — I5022 Chronic systolic (congestive) heart failure: Secondary | ICD-10-CM

## 2021-11-07 DIAGNOSIS — Z95810 Presence of automatic (implantable) cardiac defibrillator: Secondary | ICD-10-CM

## 2021-11-07 DIAGNOSIS — I255 Ischemic cardiomyopathy: Secondary | ICD-10-CM

## 2021-11-07 DIAGNOSIS — M1990 Unspecified osteoarthritis, unspecified site: Secondary | ICD-10-CM

## 2021-11-07 DIAGNOSIS — I2510 Atherosclerotic heart disease of native coronary artery without angina pectoris: Secondary | ICD-10-CM

## 2021-11-07 DIAGNOSIS — T148XXA Other injury of unspecified body region, initial encounter: Secondary | ICD-10-CM

## 2021-11-07 DIAGNOSIS — N289 Disorder of kidney and ureter, unspecified: Secondary | ICD-10-CM

## 2021-11-07 DIAGNOSIS — E785 Hyperlipidemia, unspecified: Secondary | ICD-10-CM

## 2021-11-07 DIAGNOSIS — R55 Syncope and collapse: Secondary | ICD-10-CM

## 2021-11-07 DIAGNOSIS — I10 Essential (primary) hypertension: Secondary | ICD-10-CM

## 2021-11-07 DIAGNOSIS — Z8679 Personal history of other diseases of the circulatory system: Secondary | ICD-10-CM

## 2021-11-07 DIAGNOSIS — Z955 Presence of coronary angioplasty implant and graft: Secondary | ICD-10-CM

## 2021-11-07 DIAGNOSIS — I4821 Permanent atrial fibrillation: Secondary | ICD-10-CM

## 2021-11-07 MED ORDER — BUMETANIDE 2 MG PO TAB
6 mg | ORAL_TABLET | Freq: Two times a day (BID) | ORAL | 1 refills | Status: AC
Start: 2021-11-07 — End: ?

## 2021-11-07 MED ORDER — POTASSIUM CHLORIDE 20 MEQ PO TBTQ
60 meq | ORAL_TABLET | Freq: Two times a day (BID) | ORAL | 3 refills | 30.00000 days | Status: AC
Start: 2021-11-07 — End: ?

## 2021-11-07 NOTE — Patient Instructions
Thank you for coming to [facility]. Your instructions today:    Med changes today:  Continue bumex 6 mg twice a day and potassium 60 mEq twice a day  Will arrange for Furoscix infusion tomorrow if possible  Will give additional 40 mEq of potassium on day of Furoscix infusion   Please log your weight, blood pressure, and heart rate on the following page and bring to all visits.  Labs in  day after Furoscix : BMP  Continue with 2 liter/day or 64 ounces/day fluid restriction and 2 gm (2000 mg) sodium restriction/day  Return to see Fara Boros, NP in 2 weeks      Please call the office with any questions or concerns [phone] (nurse triage).   To schedule or change an appointment call [phone].  Fax: [phone]    At the [facility], you and your family are our top priority.  You may receive a survey via email or text message that we are asking you to complete.  We value your feedback to ensure you are satisfied with every visit and we are continually providing the highest quality of patient care.  We know your time is valuable and thank you in advance for completing the survey.    Lab and test results:  As a part of the CARES act, starting 04/29/2019, some results will be released to you via mychart immediately and automatically.  You may see results before your provider sees them; however, your provider will review all these results and then they, or one of their team, will notify you of result information and recommendations.   Critical results will be addressed immediately, but otherwise, please allow [name] time to get back with you prior to you reaching out to [name] for questions.  This will usually take about 72 hours for labs and 5-7 days for procedure test results.    It was a pleasure to see you in clinic today. Thank you for allowing [name] to be a part of your health care.     Sue Lush, CHFN, APRN-NP    [facility]  Advanced Heart Failure - Silver Team  Kathleen Argue, MD   Bing Matter, MD  Nurse Practitioners: Satira Anis, Fara Boros, Eyvonne Left, and Gates Rigg  RNs: Mick Sell, [address], Leafy Ro, Vivien Rota, and Frederich Cha

## 2021-11-08 ENCOUNTER — Encounter: Admit: 2021-11-08 | Discharge: 2021-11-08 | Payer: MEDICARE

## 2021-11-08 NOTE — Telephone Encounter
[place] and spoke with Vaughan Basta, RN at SNF this am to give direct number for Freda Munro to reach me this morning. Offered the link to the furoscix video again and Vaughan Basta stated I needed to speak to Freda Munro and give the video link to her. She states again that she will have Freda Munro call me, desk number given to [place].

## 2021-11-08 NOTE — Telephone Encounter
Spoke with Freda Munro, DON at [facility]. She viewed video and denied any questions. She states she is going to have her nursing staff watch the video and begin furoscix infusion promptly. Number given to Freda Munro to call me if she has any questions or concerns. She VU to have labs drawn tomorrow, day after infusion, and to make sure pt is given potassium 40 mEq in addition to normally scheduled dose today. She VU of POC and states she will call if she has any questions.

## 2021-11-13 ENCOUNTER — Encounter: Admit: 2021-11-13 | Discharge: 2021-11-13 | Payer: MEDICARE

## 2021-11-13 DIAGNOSIS — I5022 Chronic systolic (congestive) heart failure: Secondary | ICD-10-CM

## 2021-11-13 LAB — BASIC METABOLIC PANEL
BLD UREA NITROGEN: 29 — ABNORMAL HIGH
CALCIUM: 7.6 — ABNORMAL LOW
CHLORIDE: 101
CO2: 26
CREATININE: 1.8 — ABNORMAL HIGH
GFR ESTIMATED: 35 — ABNORMAL LOW
GLUCOSE,PANEL: 126
POTASSIUM: 4.8
SODIUM: 138

## 2021-11-13 NOTE — Telephone Encounter
Message received from Candelaria Arenas with [facility] [phone] informing pt's weight today is up 3 lbs at 211.0 from 207.0 yesterday.

## 2021-11-22 ENCOUNTER — Encounter: Admit: 2021-11-22 | Discharge: 2021-11-22 | Payer: MEDICARE

## 2021-11-22 ENCOUNTER — Inpatient Hospital Stay: Admit: 2021-11-22 | Discharge: 2021-11-22 | Payer: MEDICARE

## 2021-11-22 ENCOUNTER — Inpatient Hospital Stay: Admit: 2021-11-22 | Payer: MEDICARE

## 2021-11-22 ENCOUNTER — Ambulatory Visit: Admit: 2021-11-22 | Discharge: 2021-11-23 | Payer: MEDICARE

## 2021-11-22 DIAGNOSIS — Z95810 Presence of automatic (implantable) cardiac defibrillator: Secondary | ICD-10-CM

## 2021-11-22 DIAGNOSIS — N289 Disorder of kidney and ureter, unspecified: Secondary | ICD-10-CM

## 2021-11-22 DIAGNOSIS — R55 Syncope and collapse: Secondary | ICD-10-CM

## 2021-11-22 DIAGNOSIS — T148XXA Other injury of unspecified body region, initial encounter: Secondary | ICD-10-CM

## 2021-11-22 DIAGNOSIS — I509 Heart failure, unspecified: Secondary | ICD-10-CM

## 2021-11-22 DIAGNOSIS — I10 Essential (primary) hypertension: Secondary | ICD-10-CM

## 2021-11-22 DIAGNOSIS — I219 Acute myocardial infarction, unspecified: Secondary | ICD-10-CM

## 2021-11-22 DIAGNOSIS — E785 Hyperlipidemia, unspecified: Secondary | ICD-10-CM

## 2021-11-22 DIAGNOSIS — I2510 Atherosclerotic heart disease of native coronary artery without angina pectoris: Secondary | ICD-10-CM

## 2021-11-22 DIAGNOSIS — Z955 Presence of coronary angioplasty implant and graft: Secondary | ICD-10-CM

## 2021-11-22 DIAGNOSIS — I255 Ischemic cardiomyopathy: Secondary | ICD-10-CM

## 2021-11-22 DIAGNOSIS — N179 Acute kidney failure, unspecified: Secondary | ICD-10-CM

## 2021-11-22 DIAGNOSIS — M1990 Unspecified osteoarthritis, unspecified site: Secondary | ICD-10-CM

## 2021-11-22 DIAGNOSIS — N19 Unspecified kidney failure: Secondary | ICD-10-CM

## 2021-11-22 DIAGNOSIS — I472 Recurrent ventricular tachycardia (HCC): Secondary | ICD-10-CM

## 2021-11-22 DIAGNOSIS — Z8679 Personal history of other diseases of the circulatory system: Secondary | ICD-10-CM

## 2021-11-22 DIAGNOSIS — I4821 Permanent atrial fibrillation: Secondary | ICD-10-CM

## 2021-11-22 LAB — CBC AND DIFF
ABSOLUTE BASO COUNT: 0 K/UL (ref 0–0.20)
ABSOLUTE EOS COUNT: 0.5 K/UL — ABNORMAL HIGH (ref 0–0.45)
ABSOLUTE MONO COUNT: 0.4 K/UL (ref 0–0.80)
WBC COUNT: 5.6 K/UL (ref 4.5–11.0)

## 2021-11-22 LAB — FREE T4-FREE THYROXINE: FREE T4: 0.8 ng/dL (ref 0.6–1.6)

## 2021-11-22 LAB — BASIC METABOLIC PANEL
ANION GAP: 10 (ref 3–12)
BLD UREA NITROGEN: 35 mg/dL — ABNORMAL HIGH (ref 7–25)
CALCIUM: 7.3 mg/dL — ABNORMAL LOW (ref 8.5–10.6)
CHLORIDE: 108 MMOL/L (ref 98–110)
CO2: 23 MMOL/L (ref 21–30)
CREATININE: 1.5 mg/dL — ABNORMAL HIGH (ref 0.4–1.24)
EGFR: 44 mL/min — ABNORMAL LOW (ref >60)
GLUCOSE,PANEL: 110 mg/dL — ABNORMAL HIGH (ref 70–100)
SODIUM: 141 MMOL/L (ref 137–147)

## 2021-11-22 LAB — PHOSPHORUS: PHOSPHORUS: 4 mg/dL (ref 2.0–4.5)

## 2021-11-22 LAB — COMPREHENSIVE METABOLIC PANEL
ALBUMIN: 3.4 g/dL — ABNORMAL LOW (ref 3.5–5.0)
ALK PHOSPHATASE: 124 U/L — ABNORMAL HIGH (ref 25–110)
ALT: 7 U/L (ref 7–56)
ANION GAP: 13 K/UL — ABNORMAL HIGH (ref 3–12)
AST: 19 U/L (ref 7–40)
BLD UREA NITROGEN: 35 mg/dL — ABNORMAL HIGH (ref 7–25)
CALCIUM: 7.6 mg/dL — ABNORMAL LOW (ref 8.5–10.6)
CO2: 23 MMOL/L — ABNORMAL HIGH (ref 21–30)
EGFR: 37 mL/min — ABNORMAL LOW (ref >60)
GLUCOSE,PANEL: 140 mg/dL — ABNORMAL HIGH (ref 70–100)
SODIUM: 139 MMOL/L (ref 137–147)
TOTAL BILIRUBIN: 0.3 mg/dL (ref 0.3–1.2)
TOTAL PROTEIN: 6.5 g/dL (ref 6.0–8.0)

## 2021-11-22 LAB — MAGNESIUM
MAGNESIUM: 2 mg/dL — ABNORMAL LOW (ref 1.6–2.6)
MAGNESIUM: 1.7 mg/dL (ref 1.6–2.6)

## 2021-11-22 LAB — TSH WITH FREE T4 REFLEX: TSH: 7.9 uU/mL — ABNORMAL HIGH (ref 0.35–5.00)

## 2021-11-22 MED ORDER — POLYETHYLENE GLYCOL 3350 17 GRAM PO PWPK
17 g | Freq: Every day | ORAL | 0 refills | Status: DC | PRN
Start: 2021-11-22 — End: 2021-11-22

## 2021-11-22 MED ORDER — METOPROLOL SUCCINATE 25 MG PO TB24
12.5 mg | Freq: Every day | ORAL | 0 refills | Status: AC
Start: 2021-11-22 — End: ?
  Administered 2021-11-23 – 2021-11-27 (×5): 12.5 mg via ORAL

## 2021-11-22 MED ORDER — THIAMINE MONONITRATE (VIT B1) 100 MG PO TAB
100 mg | Freq: Every day | ORAL | 0 refills | Status: AC
Start: 2021-11-22 — End: ?
  Administered 2021-11-23 – 2021-11-27 (×5): 100 mg via ORAL

## 2021-11-22 MED ORDER — ONDANSETRON HCL (PF) 4 MG/2 ML IJ SOLN
4 mg | INTRAVENOUS | 0 refills | Status: AC | PRN
Start: 2021-11-22 — End: ?

## 2021-11-22 MED ORDER — MELATONIN 5 MG PO TAB
5 mg | Freq: Every evening | ORAL | 0 refills | Status: AC | PRN
Start: 2021-11-22 — End: ?

## 2021-11-22 MED ORDER — DAPAGLIFLOZIN PROPANEDIOL 10 MG PO TAB
10 mg | Freq: Every day | ORAL | 0 refills | Status: AC
Start: 2021-11-22 — End: ?
  Administered 2021-11-23 – 2021-11-27 (×5): 10 mg via ORAL

## 2021-11-22 MED ORDER — POLYETHYLENE GLYCOL 3350 17 GRAM PO PWPK
1 | Freq: Every day | ORAL | 0 refills | Status: AC | PRN
Start: 2021-11-22 — End: ?

## 2021-11-22 MED ORDER — POTASSIUM CHLORIDE 20 MEQ PO TBTQ
60 meq | Freq: Two times a day (BID) | ORAL | 0 refills | Status: AC
Start: 2021-11-22 — End: ?
  Administered 2021-11-23 – 2021-11-27 (×9): 60 meq via ORAL

## 2021-11-22 MED ORDER — ONDANSETRON 4 MG PO TBDI
4 mg | ORAL | 0 refills | Status: AC | PRN
Start: 2021-11-22 — End: ?

## 2021-11-22 MED ORDER — ENOXAPARIN 40 MG/0.4 ML SC SYRG
40 mg | Freq: Every day | SUBCUTANEOUS | 0 refills | Status: AC
Start: 2021-11-22 — End: ?
  Administered 2021-11-23 – 2021-11-25 (×3): 40 mg via SUBCUTANEOUS

## 2021-11-22 MED ORDER — BUMETANIDE 0.25MG/ML IV DRIP (STD CONC)
.5 mg/h | INTRAVENOUS | 0 refills | Status: AC
Start: 2021-11-22 — End: ?
  Administered 2021-11-23: 01:00:00 0.5 mg/h via INTRAVENOUS
  Administered 2021-11-23: 15:00:00 1 mg/h via INTRAVENOUS

## 2021-11-22 MED ORDER — AMIODARONE 200 MG PO TAB
200 mg | Freq: Every day | ORAL | 0 refills | Status: AC
Start: 2021-11-22 — End: ?
  Administered 2021-11-23 – 2021-11-27 (×5): 200 mg via ORAL

## 2021-11-22 MED ORDER — HYDROCORTISONE 1 % TP OINT
Freq: Two times a day (BID) | TOPICAL | 0 refills | Status: AC
Start: 2021-11-22 — End: ?
  Administered 2021-11-23: 05:00:00 via TOPICAL

## 2021-11-22 MED ORDER — CHOLECALCIFEROL (VITAMIN D3) 125 MCG (5,000 UNIT) PO TAB
5000 [IU] | Freq: Every day | ORAL | 0 refills | Status: AC
Start: 2021-11-22 — End: ?
  Administered 2021-11-23 – 2021-11-27 (×6): 5000 [IU] via ORAL

## 2021-11-22 MED ORDER — LEVOTHYROXINE 50 MCG PO TAB
50 ug | Freq: Every day | ORAL | 0 refills | Status: AC
Start: 2021-11-22 — End: ?
  Administered 2021-11-23: 11:00:00 50 ug via ORAL

## 2021-11-22 MED ORDER — FINASTERIDE 5 MG PO TAB
5 mg | Freq: Every day | ORAL | 0 refills | Status: AC
Start: 2021-11-22 — End: ?
  Administered 2021-11-23 – 2021-11-27 (×5): 5 mg via ORAL

## 2021-11-22 MED ORDER — CALCIUM CARBONATE 200 MG CALCIUM (500 MG) PO CHEW
500 mg | Freq: Three times a day (TID) | ORAL | 0 refills | Status: AC
Start: 2021-11-22 — End: ?
  Administered 2021-11-23 – 2021-11-27 (×13): 500 mg via ORAL

## 2021-11-22 MED ORDER — ACETAMINOPHEN 500 MG PO TAB
500 mg | ORAL | 0 refills | Status: AC | PRN
Start: 2021-11-22 — End: ?

## 2021-11-22 MED ORDER — ALLOPURINOL 100 MG PO TAB
300 mg | Freq: Every day | ORAL | 0 refills | Status: AC
Start: 2021-11-22 — End: ?
  Administered 2021-11-23 – 2021-11-27 (×6): 300 mg via ORAL

## 2021-11-22 MED ORDER — SENNOSIDES-DOCUSATE SODIUM 8.6-50 MG PO TAB
1 | Freq: Every day | ORAL | 0 refills | Status: DC | PRN
Start: 2021-11-22 — End: 2021-11-22

## 2021-11-22 MED ORDER — SENNOSIDES-DOCUSATE SODIUM 8.6-50 MG PO TAB
1 | Freq: Every day | ORAL | 0 refills | Status: AC | PRN
Start: 2021-11-22 — End: ?
  Administered 2021-11-25: 11:00:00 1 via ORAL

## 2021-11-22 MED ORDER — TAMSULOSIN 0.4 MG PO CAP
.4 mg | Freq: Every day | ORAL | 0 refills | Status: AC
Start: 2021-11-22 — End: ?
  Administered 2021-11-23 – 2021-11-27 (×5): 0.4 mg via ORAL

## 2021-11-22 MED ORDER — BUMETANIDE 0.25 MG/ML IJ SOLN
6 mg | Freq: Once | INTRAVENOUS | 0 refills | Status: CP
Start: 2021-11-22 — End: ?
  Administered 2021-11-23: 01:00:00 6 mg via INTRAVENOUS

## 2021-11-22 NOTE — Progress Notes
Date of Service: 11/22/2021    Logan Bright is a 86 y.o. male.       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 ~ 1.6-2, PAFib not on AC due to hx of hematuria, hx of cryptogenic stroke in 2017,  gout, mild cognitive impairment, Hypothyroidism and elevated BMI. He follows with Dr. Barry Dienes.     He was admitted from SNF with hypervolemia/weight gain in May and twice in June 2023 due to non compliance with diet and fluid intake which has since been corrected.      He was last seen by me on 11/07/21 at which time he had been hospitalized with COVID post fall. He received IV diuretics during hospitalization. He was hypervolemic upon follow up and was cont on bumex 6 mg BID and given Furoscix infusion on 11/08/21 with additional potassium 40 mEq one time dose. Weight down to 205 pounds after furoscix and then back up to 211 pounds 5 days after. Planned repeat dose but no product at the facility and daughter out of town. He was approved for Cardiomems, pending implant, approval received from Dr Barry Dienes.     He presented today reporting that his legs are so swollen that they are weeping. He cont to walk around his facility without SOB, CP or dizziness and denies orthopnea or PND. He has had two furoscix infusions, most recently on 10/22 and no significant weight loss (weight records from facility are inaccurate). He cont to eat or drink whatever he wants - apparently no way for facility to strictly regulate this.             Vitals:    11/22/21 0958   BP: 111/61   BP Source: Arm, Left Upper   Pulse: 97   SpO2: 98%   O2 Device: None (Room air)   PainSc: Zero   Weight: 97.8 kg (215 lb 9.6 oz)   Height: 182.9 cm (6')     Body mass index is 29.24 kg/m?[name]   Wt Readings from Last 3 Encounters:   11/22/21 96.3 kg (212 lb 6.4 oz)   11/22/21 97.8 kg (215 lb 9.6 oz)   11/07/21 94.7 kg (208 lb 12.8 oz)        Past Medical History  Patient Active Problem List    Diagnosis Date Noted   ? Acute on chronic HFrEF (heart failure with reduced ejection fraction) (HCC) 07/12/2021   ? Fluid overload 06/18/2021   ? Hypovitaminosis D 07/03/2020   ? Colitis 05/26/2020   ? Hematuria 05/26/2020   ? Acute kidney injury superimposed on CKD  05/26/2020   ? Coronary artery disease involving native coronary artery of native heart without angina pectoris 05/23/2020   ? Diverticulitis 05/22/2020   ? Chronic combined systolic and diastolic CHF, NYHA class 3 (HCC) 03/30/2020   ? Cognitive impairment 03/30/2020   ? Cellulitis of scrotum 03/30/2020   ? Stage 3b chronic kidney disease (CKD) (HCC) 03/30/2020   ? Cognitive dysfunction 03/09/2020   ? Shortness of breath 10/29/2019   ? Heart failure exacerbated by sotalol (HCC) 10/27/2019   ? Heart failure (HCC) 07/01/2019   ? History of polycythemia 12/14/2018   ?  Hyponatremia 09/04/2018   ? Hypothyroidism 09/04/2018   ? Atrial fibrillation, permanent (HCC) 04/07/2018   ? Cryptogenic stroke [facility]) 02/06/2016     2017 - Eye doctor told him he'd had an old stroke after doing an eye exam    08/06/16 Carotid Ultrasound [facility]): Bilateral antegrade vertebral artery flow. No hemodynamically significant common or internal carotid artery stenosis.     ? Closed trimalleolar fracture of right ankle 11/12/2015   ? Debility 06/10/2013   ? Recurrent ventricular tachycardia w/ 2 VT ablations 04/2013 05/11/2013     Sustained VT w/ multiple ICD tachypacing and Shocks, failed amiodaron   05/05/13 & 05/12/13  VT ablations [facility] w/ impella support 4/15, Home off Coreg and losartan d/t post procedure hematoma, hypotension     ? AKI (acute kidney injury) (HCC) 04/16/2013   ? Automatic implantable cardioverter-defibrillator in situ 10/14/2008     Fidelis lead fracture Suspected  09/24/11 Generator replacemetn: Medtronic Evera ICD, Fidelis lead extratrion, implant new RA, RV leads, Dr. Ermelinda Das.        ? Ischemic cardiomyopathy      6/02 - infarct 2 stents .  [facility]            4/04 - Surveillance stress test at [facility]: reportedly negative.  4/05 - Stress thallium study: EF 30%, LAD and RCA defects, elevated lung uptake.- Echo doppler: EF 25%, amt& inf hypokinesis,  apical akinesis, poss  thrombus, mild valvular Regurg       5/06 - Stress thallium: EF 22%, no signficant interval change.  08/2011 - Dobut echo.  EF 10-15%. Antero-apical akinesis/dyskinesis.  No ischemia.  04/2013 RegadenThall EF 23% Extensive Anterior infarct, no ischemia, unchanged from prior     ? Neurologic cardiac syncope        3/05 - Developed syncope while at restaurant, taken to ED.  Symptoms of diaphoresis                    and lightheadedness.          4/05 - Head up tilt study: positive for inducing hypotension 72/49. Produced symptoms                    c/w previous syncopal episode.     ? HTN (hypertension)                 2002 - Diagnosis established.     ? Dyslipidemia         09/26/2008  03/14/2009  02/26/2011  04/17/2012  03/31/2013   LDL 107 (H) 141 (H) 80 108 (H) 104 (H)   04/2013: Refuses Statins understands that they reduce recurrent CV events, still declines.      ? Gout          ROS * see HPI      Physical Exam  Alert and appropriate, difficulty answering questions, limited by dementia  JVD 8 cm at 90 degrees with (-) HJR  + abdominal distention  3-4+ bil LE edema with weeping bilaterally  Breathing comfortably without wheezing, rales or rhonchi  Auscultated heart with normal S1S2, regular rhythm, no obvious murmur   Rash noted on bilateral lower extremities and lower abdomen with evidence of scratching (patient states that it has been there for months)  Able to walk into clinic unassisted       Cardiovascular Studies  Echo 06/19/21  1. Severely impaired left ventricular systolic function with an EF of 20%.  The  left ventricle is severely dilated, the end-diastolic dimension was 6.5 cm, however the end-diastolic volume was 317 mL with an index of 146 mL.  Squared.  2. 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  3. Unable to assess diastolic function.  4. The RV is probably dilated with preserved function.  5. Right-sided pacer leads were noted.  6. There is nonspecific mitral thickening with probably moderate MR.  7. Mild TR.  8. Aortic valve sclerosis without stenosis.  Mild to moderate AI.[name]  9. Unable to calculate a peak pulmonary pressure.  10. The IVC was consistent with an elevated right atrial pressure.  11. Aortic root was mildly dilated, the ascending aorta was moderately dilated.  12. No pericardial effusion.    ?  Prior study was obtained on 05/24/2020.  It demonstrated an EF of 20%, the end-diastolic dimension was 7.2 cm, the end-diastolic volume was 193 mL with an index of 98 mL/m?, the RV function was preserved, right-sided device leads were noted, there is nonspecific mitral valve thickening, there is aortic valve sclerosis without stenosis, Doppler interrogation was not performed, the aortic root was dilated, the IVC is consistent with a normal central venous pressure, there is no pericardial effusion.  ?  The LV systolic function remains similar in both studies.  The akinetic areas on the prior study have a somewhat more aneurysmal change on the current study.  Otherwise the 2 studies are similar.    PYP 09/07/18:  This study is negative and not suggestive of ATTR cardiac amyloidosis.  A negative exam does not exclude AL cardiac amyloidosis.    MPI 04/14/18:  This study is markedly abnormal.  Extensive, severely reduced mid to apical uptake defects in all cardiac segments, partially fixed.  Suggestive of extensive prior myocardial injury with mild peri-infarct ischemia (in the mid segments).this is unchanged compared to prior ADAC study (04/21/2012).  Current summed stress score is 55 (51 on prior ADAC study). Dilated left ventricle with severely reduced left ventricular ejection fraction, EF of 18% (20% on prior study).  High risk prognostic indicators for annual cardiovascular mortality remain present. Nonischemic response to ECG.          Problems Addressed Today  Encounter Diagnoses   Name Primary?   ? Atrial fibrillation, permanent (HCC) Yes   ? Coronary artery disease involving native coronary artery of native heart without angina pectoris    ? Ischemic cardiomyopathy    ? Recurrent ventricular tachycardia w/ 2 VT ablations 04/2013    ? Acute kidney injury superimposed on CKD         Assessment and Plan     HFrEF related to ICM  - Last LVEF 20% from echo 06/19/21  - NYHA class III Stage C  - Dry weight: 195 pounds, weight up to 215 pounds in clinic tdoay  - amyloid workup negative  - hypervolemic on exam today   > PLAN:   - Furoscix infusion x 1 on 11/08/21, repeat on 11/18/21 with minimal diuretic response. Will likely need to establish use of metolazone in the future to keep fluid off with oral diuretics  - questionable compliance with sodium and fluid restrictions  - rec admission for IV diuresis, discussed eligibility for infusion clinic with daughter and she declined option as not feasible for patient living in a facility  - Cardiomems approved and rec by Dr Barry Dienes, has not yet established  with Adv HF physician      GDMT Current Dose Changes made at visit   BB Toprol XL 12.5 mg daily     ACEI/ARB/ARNI No ACE/ARB/ARNI due to renal function     Aldosterone antagonist No MRA due to renal function      SGLT2i Farxiga 10 mg daily      Hydralazine/Nitrate NA     Ivabradine NA    HRMT  CRT-D    Diuretics Bumex 6 mg BID    Cardiac rehab NA as resides in a SNF    Candidacy for Adv HF therapies Not a candidate due to age      Coronary artery disease  Hx of [facility] in [facility]   Details unknown  Denies anginal Symptoms  ?  Permanent Atrial Fibrillation  CHA2DS2VASC is elevated.   Not on OAC due to hx of hematuria.   - Currently on amiodarone 200 mg daily and Toprol XL 12.5 mg daily  Managed by Dr. Barry Dienes.   ?  Chronic Kidney Disease Stage IIIB  BL Cr. ~ 1.6-2 Suspect new baseline is closer to 2-2.3.  Last creat 1.89 from 11/09/21 with K+ 4.8  POC creat 1.9 today  ?  VT s/p ICD 2010 upgraded to CRT-D 2020  S/p VT ablation in 2015  Treated with Amiodarone    Hypertension  BP today 111/61  Currently on GDMT as per above       DM Type II   >  Lab Results   Component Value Date/Time    HGBA1C 6.6 (H) 03/30/2020 02:00 AM    HGBA1C 6.6 (H) 10/28/2019 04:36 AM    HGBA1C 6.6 (H) 03/31/2013 06:35 AM        Dyslipidemia  Not on STATINS due to patient refusal per chart review.        Lab Results   Component Value Date/Time    CHOL 81 03/30/2020 04:12 AM    TRIG 63 03/30/2020 04:12 AM    HDL 39 (L) 03/30/2020 04:12 AM    LDL 41 03/30/2020 04:12 AM    VLDL 13 03/30/2020 04:12 AM    NONHDLCHOL 42 03/30/2020 04:12 AM    CHOLHDLC 4 04/17/2012 12:00 AM      Accepted for admission by Dr Archie Balboa         Current Medications (including today's revisions)  No current outpatient medications on file.            Total Time Today was 40 minutes in the following activities: Preparing to see the patient, Obtaining and/or reviewing separately obtained history, Performing a medically appropriate examination and/or evaluation, Counseling and educating the patient/family/caregiver, Ordering medications, tests, or procedures, Referring and communication with other health care professionals (when not separately reported) and Documenting clinical information in the electronic or other health record     Sue Lush,  CHFN, APRN, NP-C  Advanced Heart Failure APP  [facility]      Collaborating physician Kathleen Argue, MD

## 2021-11-22 NOTE — Patient Instructions
Thank you for coming to [facility]. Your instructions today:    Recommended admission to hospital for IV diuresis.       Please call the office with any questions or concerns [phone] (nurse triage).   To schedule or change an appointment call [phone].  Fax: [phone]    At the [facility], you and your family are our top priority.  You may receive a survey via email or text message that we are asking you to complete.  We value your feedback to ensure you are satisfied with every visit and we are continually providing the highest quality of patient care.  We know your time is valuable and thank you in advance for completing the survey.    Lab and test results:  As a part of the CARES act, starting 04/29/2019, some results will be released to you via mychart immediately and automatically.  You may see results before your provider sees them; however, your provider will review all these results and then they, or one of their team, will notify you of result information and recommendations.   Critical results will be addressed immediately, but otherwise, please allow [name] time to get back with you prior to you reaching out to [name] for questions.  This will usually take about 72 hours for labs and 5-7 days for procedure test results.    It was a pleasure to see you in clinic today. Thank you for allowing [name] to be a part of your health care.     Lidia Collum, [place] [place] [facility] [place] [facility] [place], MD   Gaylene Brooks, MD  Nurse Practitioners: Julien Girt, Sandy Salaam, Felix Pacini, and Ladona Horns  RNs: Fredrich Romans, [address] Zenon Mayo, Everrett Coombe, and Ethlyn Daniels

## 2021-11-22 NOTE — H&P (View-Only)
Internal Medicine History and Physical          Name: Derryn Apa   Medical Record Number: [id number]          Date Of Birth: 05-20-1935            Age: 86 years male    Room and Bed Number - HC720/01  Today's Date: 11/22/2021  11/22/2021  LOS: 0 days                      Principal Problem:    Heart failure (HCC)  Active Problems:    Ischemic cardiomyopathy    HTN (hypertension)    Dyslipidemia    Automatic implantable cardioverter-defibrillator in situ    Stage 3b chronic kidney disease (CKD) (HCC)    Acute on chronic HFrEF (heart failure with reduced ejection fraction) (HCC)    Assessment and Plan      Joon Callison is a 86 y.o. male presenting as a direct admission from heart failure clinic for bilateral lower extremity edema and volume overload secondary to diet noncompliance requiring inpatient diuresis.  Patient has had multiple hospital admissions from nursing facility for volume overload due to in adherence to salt and fluid restriction. Trialed Lasix 80 mg IV once 10/22 at nursing facility due to increased volume overload.  ?  Relevant PMHx includes:combined HFrEF 20% due to ICM s/p MDT CRT-D 2020, CAD s/p 2 stents at [facility] in 2002 (details unknown), VT ?s/p ICD 2010 (upgraded to CRT-D 2020) s/p Ablation X 2 in 2015 treated with amiodaorne, CKD IIIB (BCr~ 1.6-2.0), PAF not on AC due to hx of hematuria, cryptogenic stroke (2017), ?gout, mild cognitive impairment, Hypothyroidism?and elevated BMI. He follows with Dr. Barry Dienes.       #Acute on chronic systolic HFrEF (EF 20%) 2/2 ICM  - Major Complications or Comorbidities [facility]): acute/ acute on chronic systolic and/or diastolic heart failure and cardiorenal syndrome (with documentation of CKD)  - NYHA functional class III/ACC Stage C   - Admission BNP:  pending  - Last TTE (06/19/21): EF 20%, severe LV systolic dysfunction, moderate MR, mild TR, AV sclerosis, Mild AI  - Goal Dry Weight: 195 lbs > current admit weight 212 (10/26)  Diuretic Therapy Bumex 6 mg BID   Prior to admission dose  trialed Lasix 80 mg IV once 10/22   Given on admission ?6 mg Bumex IV bolus, started on Bumex 0.5 mg/hour ggt   Daily Dosing ?   ?  GDMT PTA Changes   BB Carvedilol 12.5 mg daily  ?   ACEI/ARB/ARNI No - 2/2 CKD ?   SGLT-2 Inhibitor Marcelline Deist ?   Aldosterone Antagonist No - 2/2 CKD ?   Hydralazine/Nitrate  No  ?   Ivabradine N/A ?   HRMT  Yes (CRT-D)  ?   Anticoagulation for Afib/flutter  No: Risk or history of bleeding  ?   ?Cardiac Rehab Evaluation? for LVEF <40% NA - SNF resident ?   - Currently approved for CardioMEMS implant, awaiting procedure date  - Follows with Dr. Barry Dienes  Plan:  > Give Bumex 6 mg bolus & start bumex 0.5 mg/hr ggt   > Continue PTA KCl 60 mEq BID  > Continue Farxiga 10 mg daily   > If insufficient output, will add metolazone or Diuril bolus   > Consult HF Team  > CXR  > BMP, CBC, NT-pro-BNP  > Repeat BMP & magnesium @ 2200  > Compression stockings to knee  >  Daily standing weights  > Strict I&O, goal output minimum 2L/day    #Permanent Atrial Fibrillation  #HTN  - no AC 2/2 hematuria   - Follows with Dr Barry Dienes  Plan:  > Monitor on Tele  > Continue PTA Amiodarone 200 mg daily & Toprol XL 12.5 mg daily     #Anemia of CKD  #CKD Stage IIIb (Bcr ~ 2.0)  - Admit Cr/BUN pending  - Hgb admit 13.2  Plan:  > CTM  > Avoid nephrotoxic agents    #Hypothyroidism   - Last TSH 7.89 (07/27/21)  Plan:  > Recheck TSH  > Continue levothyroxine 50 mcg    #BPH > Continue Finasteride 5 mg daily, tamulosin 0.4 mg qAM  #T2DM > repeat A1c  #Gout > Continue allopurinol 300 mg daily   #Ezcema > continue PTA hydrocortisone 1% cream in aquafor    Allergies:  Chlorhexidine gluconate and Colchicine    Nutrition: Cardiac/low Sodium diet, Fluid restriction 1500 mL/day  Fluids and Electrolytes: Fluids PRN, Replace Electrolytes PRN  DVT PPx: lovenox    Code Status: DNAR-Full Intervention    Disposition: Tele - admit for IV diuresis    Patient discussed with Verdene Rio, DO   Donzetta Sprung, DO  Internal Medicine, PGY-1              Available on Voalte or by Pager [phone]    History of Present Illness     Subjective:  History obtained via patient and patient's daughter Britta Mccreedy.  Daughter went out of town the entire last week and reports that patient was around baseline weight prior to her leaving.  When she returned this last week, they attempted Lasix 80 mg IV on 10/22 due to his weight gain and extensive bilateral lower extremity edema.  She also reports that symptomatically he has been having more dyspnea on exertion and has not been able to walk as far nor as quickly, this is new within the last week.  When patient becomes volume overloaded, he has a flare of his rash and unfortunately patient will scratch until he bleeds which is the current situation.  Patient currently denies any chest pain, shortness of breath, abdominal pain.  Does endorse some skin itching, as well as, abdominal bloating.  His last bowel movement was this morning prior to his clinic appointment.  He denies any fever or chills, no nausea or vomiting.     Patient communicated understanding and support of plan.    No additional complaints.     ROS: 10 point review systems obtained and negative except noted in HPI.  _____________________________________________________________________________________    Primary Care Physician: Rock Nephew    Medical History:   Diagnosis Date   ? Arthritis    ? CAD (coronary artery disease)    ? Congestive heart disease (HCC)    ? Dyslipidemia    ? Fracture     RT ANKLE   ? History of ventricular tachycardia    ? HTN (hypertension)    ? ICD (implantable cardiac defibrillator) in place    ? Ischemic cardiomyopathy    ? Myocardial infarction [facility])    ? Neurologic cardiac syncope    ? Renal failure    ? Renal insufficiency     mild   ? S/P coronary artery stent placement        Surgical History:   Procedure Laterality Date   ? EXTERNAL FIXATION APPLICATION ANKLE Right 11/13/2015    Performed by Azzie Glatter,  MD at [facility] [place]   ? EXTERNAL FIXATION REMOVAL RIGHT ANKLE Right 12/08/2015    Performed by Azzie Glatter, MD at [facility] OR   ? OPEN REDUCTION INTERNAL FIXATION RIGHT ANKLE Right 12/08/2015    Performed by Azzie Glatter, MD at [facility] OR   ? INSERTION LEAD LEFT VENTRICLE AT TIME OF INSERTION OF PACEMAKER/ IMPLANTABLE DEFIBRILLATOR Left 09/09/2018    Performed by Annamarie Dawley, MD at [facility] EP LAB   ? REMOVAL AND REPLACEMENT PERMANENT PACEMAKER GENERATOR  - MULTIPLE LEAD SYSTEM Left 09/09/2018    Performed by Annamarie Dawley, MD at [facility] EP LAB   ? CARDIAC DEFIBRILLATOR PLACEMENT     ? HX HEART CATHETERIZATION     ? HX ICD PLACEMENT     ? HX PACEMAKER PLACEMENT         Social History     Socioeconomic History   ? Marital status: Widowed   ? Number of children: 2   Tobacco Use   ? Smoking status: Never   ? Smokeless tobacco: Never   Vaping Use   ? Vaping Use: Never used   Substance and Sexual Activity   ? Alcohol use: Never   ? Drug use: Never       Family History   Problem Relation Age of Onset   ? Cancer Mother    ? Gout Mother    ? Cancer Sister    ? Diabetes Sister    ? Heart Attack Sister    ? Hypertension Sister    ? Gout Sister    ? Arthritis-osteo Sister    ? Stroke Sister    ? Thyroid Disease Sister    ? Migraines Sister        Objective:       Vital Signs:  BP 113/57 (BP Source: Arm, Right Upper)  - Pulse 62  - Temp 36.3 ?C (97.3 ?F)  - Wt 96.3 kg (212 lb 6.4 oz)  - SpO2 97%  - BMI 28.81 kg/m?     Physical Exam  Vitals and nursing note reviewed.   Constitutional:       General: He is not in acute distress.     Appearance: Normal appearance. He is normal weight. He is not ill-appearing, toxic-appearing or diaphoretic.   HENT:      Head: Atraumatic.      Mouth/Throat:      Mouth: Mucous membranes are dry.   Eyes:      General: No scleral icterus.     Extraocular Movements: Extraocular movements intact.      Conjunctiva/sclera: Conjunctivae normal.   Cardiovascular:      Rate and Rhythm: Normal rate and regular rhythm.      Pulses: Normal pulses.      Heart sounds: Murmur heard.      Comments: S3 soft  Abdominal:      General: There is distension.      Tenderness: There is no abdominal tenderness. There is no guarding or rebound.      Hernia: A hernia is present.      Comments: Firm, negative fluid wave   Musculoskeletal:         General: Normal range of motion.      Cervical back: Normal range of motion.   Skin:     General: Skin is warm and dry.   Neurological:      General: No focal deficit present.      Mental Status: He is alert and oriented  to person, place, and time.      Cranial Nerves: No cranial nerve deficit.      Motor: No weakness.      Gait: Gait normal.      Comments: No pronator drift, CN 2-12 exam intact, neg romberg, normal heel to shin.    Psychiatric:         Mood and Affect: Mood normal.         Behavior: Behavior normal.       Intake/Output Summary:  (Last 24 hours)  No intake or output data in the 24 hours ending 11/22/21 1550   Stool Occurrence: 0    Labs:  Recent Labs     11/22/21  1031   CR 1.9*      BNP: No results for input(s): BNP in the last 72 hours.  CARDIAC ENZYMES: No results for input(s): CKMB in the last 72 hours.      Medications:  No current outpatient medications on file.       Recent Imaging  No results found.  _____________________________________________________________________________________    Sharyl Nimrod, DO  11/22/21

## 2021-11-23 ENCOUNTER — Encounter: Admit: 2021-11-23 | Discharge: 2021-11-23 | Payer: MEDICARE

## 2021-11-23 ENCOUNTER — Inpatient Hospital Stay: Admit: 2021-11-23 | Discharge: 2021-11-23 | Payer: MEDICARE

## 2021-11-23 MED ADMIN — MAGNESIUM SULFATE IN D5W 1 GRAM/100 ML IV PGBK [166578]: 1 g | INTRAVENOUS | @ 09:00:00 | Stop: 2021-11-23 | NDC 63323010800

## 2021-11-23 MED ADMIN — POTASSIUM CHLORIDE 20 MEQ PO TBTQ [35943]: 20 meq | ORAL | @ 09:00:00 | Stop: 2021-11-23 | NDC 00832532511

## 2021-11-23 MED ADMIN — METOLAZONE 5 MG PO TAB [10588]: 5 mg | ORAL | @ 14:00:00 | Stop: 2021-11-23 | NDC 51079002401

## 2021-11-23 MED ADMIN — MAGNESIUM SULFATE IN D5W 1 GRAM/100 ML IV PGBK [166578]: 1 g | INTRAVENOUS | @ 14:00:00 | Stop: 2021-11-23 | NDC 63323010800

## 2021-11-23 NOTE — Telephone Encounter
Met Logan Bright in his room today to discuss cardiomems. Provided him with a brochure with our cards attached and showed him the PED he would use to transmit readings. We discussed his facility some and he thought he might be able to use the pillow but he says his facility is short-staffed and he wasn't sure of that.I told him I'd contact his daughter to discuss as well and he was okay with that.    Contacted Logan Bright. I let her know I met Logan Bright today and discussed cardiomems with him. I left the brochure with our cards attached for her to look at and will send her a mychart message so she has our information. She is open to discussing the device for Logan Bright but she wants to make sure it's not just one more thing he has to deal with. He doesn't do well with IV sticks or having labs drawn, she worries about him being okay for the procedure. Provided education about how the cardiomems works and how it could potentially help manage his fluids to prevent more hospitalizations, but we would also need the facilities help with assisting Logan Bright to lay on the pillow and make medication changes when we authorize. She states Logan Bright ordered furoscix and she keeps it at her apartment to make sure it is used correctly, but she has been traveling a lot lately, and when he needed it, she was not available to bring it to him. She will look over the information she was given and discuss it with Logan Bright to see if it is something they can make work for them, she will let us know what they decide.

## 2021-11-24 MED ADMIN — WATER FOR INJECTION, STERILE IJ SOLN [79513]: 20 mL | INTRAVENOUS | @ 01:00:00 | Stop: 2021-11-24 | NDC 00409488723

## 2021-11-24 MED ADMIN — BUMETANIDE 0.25 MG/ML IJ SOLN [9308]: 1 mg/h | INTRAVENOUS | @ 15:00:00 | NDC 72205010201

## 2021-11-24 MED ADMIN — BUMETANIDE 0.25 MG/ML IJ SOLN [9308]: 1 mg/h | INTRAVENOUS | @ 01:00:00 | NDC 72205010201

## 2021-11-24 MED ADMIN — CHLOROTHIAZIDE SODIUM 500 MG IV SOLR [81318]: 500 mg | INTRAVENOUS | @ 01:00:00 | Stop: 2021-11-24 | NDC 63323065820

## 2021-11-25 MED ADMIN — BUMETANIDE 0.25 MG/ML IJ SOLN [9308]: 1 mg/h | INTRAVENOUS | @ 02:00:00 | NDC 72205010201

## 2021-11-25 MED ADMIN — LEVOTHYROXINE 50 MCG PO TAB [4421]: 50 ug | ORAL | @ 11:00:00 | NDC 00904695061

## 2021-11-25 MED ADMIN — BUMETANIDE 0.25 MG/ML IJ SOLN [9308]: 1 mg/h | INTRAVENOUS | @ 14:00:00 | NDC 72205010201

## 2021-11-25 MED ADMIN — CHLOROTHIAZIDE SODIUM 500 MG IV SOLR [81318]: 250 mg | INTRAVENOUS | @ 18:00:00 | Stop: 2021-11-25 | NDC 63323065820

## 2021-11-25 MED ADMIN — WATER FOR INJECTION, STERILE IJ SOLN [79513]: 20 mL | INTRAVENOUS | @ 18:00:00 | Stop: 2021-11-25 | NDC 63323018508

## 2021-11-26 ENCOUNTER — Inpatient Hospital Stay: Admit: 2021-11-26 | Discharge: 2021-11-26 | Payer: MEDICARE

## 2021-11-26 ENCOUNTER — Encounter: Admit: 2021-11-26 | Discharge: 2021-11-26 | Payer: MEDICARE

## 2021-11-26 MED ORDER — PERFLUTREN LIPID MICROSPHERES 1.1 MG/ML IV SUSP
1-10 mL | Freq: Once | INTRAVENOUS | 0 refills | Status: CP | PRN
Start: 2021-11-26 — End: ?
  Administered 2021-11-26: 18:00:00 4 mL via INTRAVENOUS

## 2021-11-26 MED ADMIN — BUMETANIDE 0.25 MG/ML IJ SOLN [9308]: 1 mg/h | INTRAVENOUS | @ 03:00:00 | NDC 72205010201

## 2021-11-26 MED ADMIN — BUMETANIDE 0.25 MG/ML IJ SOLN [9308]: 1 mg/h | INTRAVENOUS | @ 14:00:00 | Stop: 2021-11-26 | NDC 72205010201

## 2021-11-26 MED ADMIN — ENOXAPARIN 30 MG/0.3 ML SC SYRG [85048]: 30 mg | SUBCUTANEOUS | @ 02:00:00 | NDC 63323055921

## 2021-11-26 MED ADMIN — BUMETANIDE 1 MG PO TAB [9310]: 4 mg | ORAL | @ 22:00:00 | NDC 00904701606

## 2021-11-26 MED ADMIN — BUMETANIDE 1 MG PO TAB [9310]: 4 mg | ORAL | @ 20:00:00 | NDC 00904701606

## 2021-11-26 MED ADMIN — LEVOTHYROXINE 50 MCG PO TAB [4421]: 50 ug | ORAL | @ 12:00:00 | NDC 00904695061

## 2021-11-26 NOTE — Progress Notes
HFMS device orders submitted per Anda Kraft and Dr. Megan Mans request.    Jeannine Boga, APRN-NP  P Mac Cardiomems; E[place], Angela Nevin D, APRN-NP  This patient does not wish for Cardiomems d/t not wanting invasive procedures. Tarun talked to Dr. Ricard Dillon also. Pt and Dr. Ricard Dillon okay with HFMS. He is currently euvolemic inpatient and hopefully can discharge in the next day or two.     Copying Angela Nevin as she sees him in clinic.     Thanks,   Anda Kraft

## 2021-11-27 ENCOUNTER — Encounter: Admit: 2021-11-27 | Discharge: 2021-11-27 | Payer: MEDICARE

## 2021-11-27 MED ADMIN — ENOXAPARIN 30 MG/0.3 ML SC SYRG [85048]: 30 mg | SUBCUTANEOUS | @ 02:00:00 | NDC 00781323801

## 2021-11-27 MED ADMIN — LEVOTHYROXINE 50 MCG PO TAB [4421]: 50 ug | ORAL | @ 10:00:00 | Stop: 2021-11-27 | NDC 00904695061

## 2021-11-27 MED ADMIN — BUMETANIDE 1 MG PO TAB [9310]: 4 mg | ORAL | @ 14:00:00 | Stop: 2021-11-27 | NDC 00904701606

## 2021-11-27 NOTE — Progress Notes
Confirmed HFMS was sent

## 2021-11-27 NOTE — Progress Notes
Sandy Salaam D, APRN-NP  Rutkowski, Kathryn L, APRN-NP; P Mac Cardiomems; P Cvm Heart Failure Scheduling  Let's get him on the schedule to establish with Dr Regenia Skeeter in the outpatient setting as well please.

## 2021-11-28 ENCOUNTER — Encounter: Admit: 2021-11-28 | Discharge: 2021-11-28 | Payer: MEDICARE

## 2021-11-28 NOTE — Telephone Encounter
Hospital Discharge Follow Up      Reached Patient: Yes, patient was identified, for their safety, using dual identification of name and date of birth  Patient Date of Birth: 11-27-1935     Admission Information:     Hospital Name: [facility]  Admission Date: 11/22/2021    Discharge Date: 11/27/2021    Admission Diagnosis: heart failure  Discharge Diagnosis: (Primary)Heart failure, Ischemic cardiomyopathy, HTN, dyslipidemia, Automatic implantable cardioverter-defibrillator in situ, Stage 3b chronic kidney disease, Acute on chronic HFrEF  Has there been a discharge within the last 30 days? No N/A  Hospital Services: Unplanned  Today's call is 1 (business) days post discharge      Discharge Instruction Review   Did patient receive and understand discharge instructions? Yes    Home Health ordered? No  Caregiver assistance in the home? patient resides at [facility]   Are there concerns regarding the patient's ADL'S? No  Is patient a fall risk? Yes    Special diet? Yes, Cardiac diet. fluid restriction 1.5 L 2 G sodium restriction      Medication Reconciliation    Changes to pre-hospital medications? Yes   CHANGE how you take:  acetaminophen (TYLENOL EXTRA STRENGTH)  bumetanide (BUMEX)  Were new prescriptions filled? Yes   START taking:  metolazone (ZAROXOLYN)  Meds reviewed and reconciled? Yes    Current Outpatient Medications   Medication Instructions   ? acetaminophen (TYLENOL EXTRA STRENGTH) 500 mg, Oral, EVERY  6 HOURS PRN, Max of 4,000 mg of acetaminophen in 24 hours.   ? allopurinoL (ZYLOPRIM) 300 mg, Oral, DAILY, Take with food.   ? amiodarone (CORDARONE) 200 mg, Oral, DAILY, Take with food.   ? bumetanide (BUMEX) 4 mg, Oral, TWICE DAILY   ? calcium carbonate (TUMS E-X) 300 mg (750 mg) chewable tablet 2 tablets, Oral, AT BEDTIME DAILY   ? CHOLEcalciferoL (vitamin D3) (VITAMIN D3) 5,000 Units, Oral, DAILY   ? clotrimazole (LOTRIMIN) 1 % topical cream Topical, TWICE DAILY, Apply to affect areas of feet for tinea pedis   ? dapagliflozin propanediol (FARXIGA) 10 mg, Oral, DAILY   ? DERMAREST ECZEMA (HYDROCORT) 1 % topical lotion Topical, TWICE DAILY, Apply to face/groin/underarms   ? docusate (COLACE) 100 mg, Oral, TWICE DAILY   ? finasteride (PROSCAR) 5 mg, Oral, DAILY   ? hydrocortisone (CORTIZONE-10) 1 % topical cream Topical, TWICE DAILY, Apply to face/groin/underarms   ? latanoprost (XALATAN) 0.005 % ophthalmic solution 1 drop, Both Eyes, AT BEDTIME DAILY   ? levothyroxine (SYNTHROID) 50 mcg tablet TAKE 1 TABLET BY MOUTH EVERY DAY 30 MINUTES BEFORE BREAKFAST   ? metOLazone (ZAROXOLYN) 2.5 mg, Oral, EVERY  7 DAYS, Take 2.5 mg once each thursday   ? metoprolol succinate XL (TOPROL XL) 12.5 mg, Oral, DAILY, HOLD IF SBP < 100   ? potassium chloride SR (KLOR-CON M20) 20 mEq tablet 60 mEq, Oral, TWICE DAILY, Take with a meal and a full glass of water.   ? tamsulosin (FLOMAX) 0.4 mg, Oral, DAILY AFTER BREAKFAST, Do not crush, chew or open capsules. Take 30 minutes following the same meal each day.   ? thiamine mononitrate (vit B1) 100 mg, Oral, DAILY   ? triamcinolone acetonide (KENALOG) 0.1 % topical cream Topical, TWICE DAILY, Use for the rest of your body besides face/groin/underarms         Understanding Condition   Having any current symptoms? No spoke with patient daughter who states she has just spoken with patient on the  phone and states patient states he feels fine and doesn't appear to be short of breath or cough but is unsure about edema.  Patient daughter is unsure if patient has weighed or had his blood pressure checked.  Patient does not have a phone in his room at the facility.    Do you have a history of Heart Failure? Yes, What time do you weigh yourself daily? unsure  What was your weight today? unsure  Have you experienced weight gain since you were discharged from the hospital? unsure  Have you had an increase in swelling since discharge?unsure    Have you had an increase in shortness of air since discharge? No  Have you had an increase in fatigue since discharge? No  Have you had an increase in abdominal bloating/tightness since discharge? unsure  Do you have a zone sheet? No      Patient understands when to seek additional medical care? Yes   Other items discussed: Activity as tolerated      Scheduling Follow-up Appointment   Upcoming appointments:   Future Appointments   Date Time Provider Department Center   12/04/2021 10:00 AM Sue Lush, APRN-NP [facility] CVM Exam   12/28/2021  1:00 PM Lyndel Safe, MD [facility] CVM Exam   01/09/2022 10:20 AM Doran Durand, MD [facility] IM   01/09/2022  1:00 PM Gardner Candle, MD [facility] IM     Does the patient require 7 day follow up appointment? Yes   Hospital Follow-Up scheduled with PCP? No, patient declined appointment  When was patient?s last PCP visit: Visit date not found   PCP primary location: [facility]  Specialist appointment scheduled? Yes, with Cardiology 12/04/2021, 12/28/2021; Nephro 01/09/2022; Derm 01/09/2022  Is assistance with transportation needed? No   MyChart message sent? Active in MyChart. No message sent.   Artera text sent? No    Yancey Flemings, RN    Note to patient: The 21st Century Cures Act makes medical notes like these available to patients in the interest of transparency. However, be advised this is a medical document. It is intended as peer to peer communication. It is written in medical language and may contain abbreviations or words that are unfamiliar. It may appear direct. Medical documents are intended to carry relevant information, facts as evident, and the clinical opinion of the clinician.

## 2021-12-04 ENCOUNTER — Encounter: Admit: 2021-12-04 | Discharge: 2021-12-04 | Payer: MEDICARE

## 2021-12-04 ENCOUNTER — Ambulatory Visit: Admit: 2021-12-04 | Discharge: 2021-12-05 | Payer: MEDICARE

## 2021-12-04 DIAGNOSIS — Z8679 Personal history of other diseases of the circulatory system: Secondary | ICD-10-CM

## 2021-12-04 DIAGNOSIS — I509 Heart failure, unspecified: Secondary | ICD-10-CM

## 2021-12-04 DIAGNOSIS — T148XXA Other injury of unspecified body region, initial encounter: Secondary | ICD-10-CM

## 2021-12-04 DIAGNOSIS — I255 Ischemic cardiomyopathy: Secondary | ICD-10-CM

## 2021-12-04 DIAGNOSIS — N289 Disorder of kidney and ureter, unspecified: Secondary | ICD-10-CM

## 2021-12-04 DIAGNOSIS — M1990 Unspecified osteoarthritis, unspecified site: Secondary | ICD-10-CM

## 2021-12-04 DIAGNOSIS — I10 Essential (primary) hypertension: Secondary | ICD-10-CM

## 2021-12-04 DIAGNOSIS — Z95810 Presence of automatic (implantable) cardiac defibrillator: Secondary | ICD-10-CM

## 2021-12-04 DIAGNOSIS — I2510 Atherosclerotic heart disease of native coronary artery without angina pectoris: Secondary | ICD-10-CM

## 2021-12-04 DIAGNOSIS — R55 Syncope and collapse: Secondary | ICD-10-CM

## 2021-12-04 DIAGNOSIS — I219 Acute myocardial infarction, unspecified: Secondary | ICD-10-CM

## 2021-12-04 DIAGNOSIS — I4821 Permanent atrial fibrillation: Secondary | ICD-10-CM

## 2021-12-04 DIAGNOSIS — Z955 Presence of coronary angioplasty implant and graft: Secondary | ICD-10-CM

## 2021-12-04 DIAGNOSIS — I5023 Acute on chronic systolic (congestive) heart failure: Secondary | ICD-10-CM

## 2021-12-04 DIAGNOSIS — N19 Unspecified kidney failure: Secondary | ICD-10-CM

## 2021-12-04 DIAGNOSIS — E785 Hyperlipidemia, unspecified: Secondary | ICD-10-CM

## 2021-12-04 DIAGNOSIS — I5042 Chronic combined systolic (congestive) and diastolic (congestive) heart failure: Secondary | ICD-10-CM

## 2021-12-04 MED ORDER — METOLAZONE 2.5 MG PO TAB
5 mg | ORAL_TABLET | ORAL | 1 refills | 84.00000 days | Status: AC
Start: 2021-12-04 — End: ?

## 2021-12-04 MED ORDER — POTASSIUM CHLORIDE 20 MEQ PO TBTQ
60 meq | ORAL_TABLET | Freq: Two times a day (BID) | ORAL | 3 refills | 30.00000 days | Status: AC
Start: 2021-12-04 — End: ?

## 2021-12-04 MED ORDER — BUMETANIDE 2 MG PO TAB
5 mg | ORAL_TABLET | Freq: Two times a day (BID) | ORAL | 3 refills | Status: AC
Start: 2021-12-04 — End: ?

## 2021-12-04 NOTE — Progress Notes
Date of Service: 12/04/2021    Logan Bright is a 86 y.o. male.       HPI       Logan Bright  is a 14M combined HFrEF 20% due to ICM s/p MDT CRT-D 2020, CAD s/p 2 stents at [facility] in 2002 (details unknown), VT  s/p ICD 2010 (upgraded to CRT-D 2020) s/p Ablation X 2 in 2015 treated with amiodaorne, CKD IIIB with BL cr. ~ 1.6-2, PAFib not on AC due to hx of hematuria, hx of cryptogenic stroke in 2017,  gout, mild cognitive impairment, Hypothyroidism and elevated BMI. He follows with Dr. Barry Dienes.     He was admitted from SNF with hypervolemia/weight gain in May and twice in June 2023 due to non compliance with diet and fluid intake which has since been corrected. He was admitted to [facility] in Sept 2023 following a fall and was found to be + COVID, also IV diuresed during stay.     He was last seen by me on 11/22/21 at which time weight was up to 215 from 205 pounds and LE swelling so bad his legs were weeping. He was admitted for IV diuresis. He was IV diuresed with bumex drip along with metolazone and diuril. Weight down 22 pounds.  Rec for HFMS instead of Cardiomems  for fluid monitoring as he preferred non-invasive option. AKI with creat peaking to 2.68 and down trending at time of discharge on 11/27/21. He was discharged on bumex 4 mg BID with metolazone 2.5 mg once a week. He will establish care with Dr Hedda Slade on 12/28/21.     He presented today stating that he feels great. (noting that he always feels great). He did tell me today that he felt better when he was discharged than he did when he went into the hospital. He does not feel any worse today than he did when he was discharged. He denies CP, SOB, dizziness, orthopnea, PND, LE swelling or falls. He has ACE wraps on today and tells me that they are leaving the ACE wraps on all the time, not taking them off at night. Daughter was not here today and the aid who came with him tells me that he carries a large jug of water around with him all day long. She is not sure how much fluid that container holds. Records received from facility today with BP and weights as well as his medication list which appears to be correct. He has not received HFMS device yet.               Vitals:    12/04/21 1004   BP: 106/62   BP Source: Arm, Right Upper   Pulse: 85   SpO2: 96%   O2 Device: None (Room air)   PainSc: Zero   Weight: 93.4 kg (205 lb 12.8 oz)   Height: 182.9 cm (6')     Body mass index is 27.91 kg/m?[name]   Wt Readings from Last 3 Encounters:   12/04/21 93.4 kg (205 lb 12.8 oz)   11/27/21 86.4 kg (190 lb 6.4 oz)   11/26/21 86.6 kg (191 lb)        Past Medical History  Patient Active Problem List    Diagnosis Date Noted   ? Acute on chronic HFrEF (heart failure with reduced ejection fraction) (HCC) 07/12/2021   ? Fluid overload 06/18/2021   ? Hypovitaminosis D 07/03/2020   ? Colitis 05/26/2020   ? Hematuria 05/26/2020   ?  Acute kidney injury superimposed on CKD  05/26/2020   ? Coronary artery disease involving native coronary artery of native heart without angina pectoris 05/23/2020   ? Diverticulitis 05/22/2020   ? Chronic combined systolic and diastolic CHF, NYHA class 3 (HCC) 03/30/2020   ? Cognitive impairment 03/30/2020   ? Cellulitis of scrotum 03/30/2020   ? Stage 3b chronic kidney disease (CKD) (HCC) 03/30/2020   ? Cognitive dysfunction 03/09/2020   ? Shortness of breath 10/29/2019   ? Heart failure exacerbated by sotalol (HCC) 10/27/2019   ? Heart failure (HCC) 07/01/2019   ? History of polycythemia 12/14/2018   ? Hyponatremia 09/04/2018   ? Hypothyroidism 09/04/2018   ? Atrial fibrillation, permanent (HCC) 04/07/2018   ? Cryptogenic stroke [facility]) 02/06/2016     2017 - Eye doctor told him he'd had an old stroke after doing an eye exam    08/06/16 Carotid Ultrasound [facility]): Bilateral antegrade vertebral artery flow. No hemodynamically significant common or internal carotid artery stenosis.     ? Closed trimalleolar fracture of right ankle 11/12/2015   ? Debility 06/10/2013   ? Recurrent ventricular tachycardia w/ 2 VT ablations 04/2013 05/11/2013     Sustained VT w/ multiple ICD tachypacing and Shocks, failed amiodaron   05/05/13 & 05/12/13  VT ablations [facility] w/ impella support 4/15, Home off Coreg and losartan d/t post procedure hematoma, hypotension     ? AKI (acute kidney injury) (HCC) 04/16/2013   ? 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 Ermelinda Das.        ? Ischemic cardiomyopathy      6/02 - infarct 2 stents .  [facility]            4/04 - Surveillance stress test at [facility]: reportedly negative.  4/05 - Stress thallium study: EF 30%, LAD and RCA defects, elevated lung uptake.- Echo doppler: EF 25%, amt& inf hypokinesis,  apical akinesis, poss  thrombus, mild valvular Regurg       5/06 - Stress thallium: EF 22%, no signficant interval change.  08/2011 - Dobut echo.  EF 10-15%. Antero-apical akinesis/dyskinesis.  No ischemia.  04/2013 RegadenThall EF 23% Extensive Anterior infarct, no ischemia, unchanged from prior     ? Neurologic cardiac syncope        3/05 - Developed syncope while at restaurant, taken to ED.  Symptoms of diaphoresis                    and lightheadedness.          4/05 - Head up tilt study: positive for inducing hypotension 72/49. Produced symptoms                    c/w previous syncopal episode.     ? HTN (hypertension)                 2002 - Diagnosis established.     ? Dyslipidemia         09/26/2008  03/14/2009  02/26/2011  04/17/2012  03/31/2013   LDL 107 (H) 141 (H) 80 108 (H) 104 (H)   04/2013: Refuses Statins understands that they reduce recurrent CV events, still declines.      ? Gout          ROS * see HPI      Physical  Exam  Alert and appropriate, difficulty answering questions, limited by dementia  JVD 7-8 cm at 90 degrees with (-) HJR  + abdominal distention  2+ bil LE edema with ACE wraps on bil LE (pitting edema above wraps)  Breathing comfortably without wheezing, rales or rhonchi  Auscultated heart with normal S1S2, regular rhythm, no obvious murmur   Rash noted on bilateral lower extremities and lower abdomen with evidence of scratching (patient states that it has been there for months)  Able to walk into clinic unassisted       Cardiovascular Studies    Echo 11/26/21  The left ventricle is severely dilated. Wall thickness is increased. Mild eccentric hypertrophy. The left ventricular systolic function is severely reduced. The visually estimated ejection fraction is 15-20%. There are segmental wall motion abnormalities, as described below. No thrombus present.  ?  The right ventricular size is normal. The right ventricular systolic function is normal.  ?  Left Atrium: Severely dilated. Right Atrium: Mildly dilated.  ?  Aortic Valve: The valve is sclerotic. Trileaflet. No stenosis. Mild to moderate regurgitation.  ?  Mitral Valve: Non-specific thickening. Mild to moderate regurgitation. The jet is posteriorly directed and impinges on the atrial wall. There is moderate mitral annular calcification.  ?  The pulmonary artery pressure could not be estimated due to inadequate tricuspid regurgitation signal.  ?  No pericardial effusion.  ?  Compared with study dated 06/19/2021, no significant change is noted.    Device eval 11/23/21  DEVICE ASSESSMENT:  LV Capture Management determined that threshold increased by 2.375 V from 09-Nov-2021 to 14-Oct2023  Available daily battery/lead measurements within expected range. Lead trends stable.  ARRHYTHMIA SUMMARY:  No arrhythmias/high rate episodes detected since last interrogation on 07-Nov-2021  OBSERVATIONS:  Possible loss of LV capture (refer to Effective CRT episodes)  Possible OptiVol fluid accumulation: 12-Nov-2021 -- ongoing      Problems Addressed Today  Encounter Diagnoses   Name Primary?   ? Acute on chronic HFrEF (heart failure with reduced ejection fraction) (HCC) Yes   ? Atrial fibrillation, permanent (HCC)    ? Chronic combined systolic and diastolic CHF, NYHA class 3 (HCC)    ? Coronary artery disease involving native coronary artery of native heart without angina pectoris    ? Ischemic cardiomyopathy        Assessment and Plan     HFrEF related to Dilated ICM  - Last LVEF 15-20% from echo 11/26/21  - NYHA class III Stage C  - Dry weight: 195 pounds - 190 pounds upon d/c, first weight at facility 196 pounds up to 204 pounds at facility today, 205 pounds here  - previous amyloid workup negative  - hypervolemic on exam again today   > PLAN:   - poor diuretic response to Furoscix (several doses still available to use)   - planned monitoring with HFMS (not yet delivered)  - Rec increasing bumex to 5 mg BID (from 4 mg BID) and metolazone to 5 mg (from 2.5 mg) once a week. Additional 40 mEq of potassium once a week with metolazone  - Attempting to get more accurate weights from facility asking them to weigh him daily in a hospital gown before breakfast. Call for weight gain 3 pounds overnight or 5 pounds in a week. 6      GDMT Current Dose Changes made at visit   BB Toprol XL 12.5 mg daily     ACEI/ARB/ARNI No ACE/ARB/ARNI due to renal function  Aldosterone antagonist No MRA due to renal function      SGLT2i Farxiga 10 mg daily      Hydralazine/Nitrate NA     Ivabradine NA    HRMT  CRT-D    Diuretics Bumex 4 mg BID + 2.5 mg metolazone once a week Bumex 5 mg BID + metolazone 5 mg once  A week   Cardiac rehab NA as resides in a SNF    Candidacy for Adv HF therapies Not a candidate due to age      Coronary artery disease  Hx of PCI in 2002 [facility]   Details unknown  Denies anginal Symptoms  ?  Permanent Atrial Fibrillation  CHA2DS2VASC is elevated.   Not on OAC due to hx of hematuria.   - Currently on amiodarone 200 mg daily and Toprol XL 12.5 mg daily  Managed by Dr. Barry Dienes.   ?  Chronic Kidney Disease Stage IIIB  BL Cr. ~ 1.6-2 Suspect new baseline is closer to 2-2.3.  Last creat 2.62 from 11/27/21 with K+ 5.0  POC creat 2.0 today  ?  VT s/p ICD 2010 upgraded to CRT-D 2020  S/p VT ablation in 2015  Treated with Amiodarone    Hypertension  BP today 106/62  Currently on GDMT as per above       DM Type II   >  Lab Results   Component Value Date/Time    HGBA1C 6.6 (H) 03/30/2020 02:00 AM    HGBA1C 6.6 (H) 10/28/2019 04:36 AM    HGBA1C 6.6 (H) 03/31/2013 06:35 AM        Dyslipidemia  Not on STATINS due to patient refusal per chart review.        Lab Results   Component Value Date/Time    CHOL 81 03/30/2020 04:12 AM    TRIG 63 03/30/2020 04:12 AM    HDL 39 (L) 03/30/2020 04:12 AM    LDL 41 03/30/2020 04:12 AM    VLDL 13 03/30/2020 04:12 AM    NONHDLCHOL 42 03/30/2020 04:12 AM    CHOLHDLC 4 04/17/2012 12:00 AM             Current Medications (including today's revisions)  ? acetaminophen (TYLENOL EXTRA STRENGTH) 500 mg tablet Take one tablet by mouth every 6 hours as needed. Max of 4,000 mg of acetaminophen in 24 hours.   ? allopurinoL (ZYLOPRIM) 300 mg tablet Take one tablet by mouth daily. Take with food.   ? amiodarone (CORDARONE) 200 mg tablet Take one tablet by mouth daily. Take with food.   ? bumetanide (BUMEX) 2 mg tablet Take 2.5 tablets by mouth twice daily.   ? calcium carbonate (TUMS E-X) 300 mg (750 mg) chewable tablet Chew two tablets by mouth at bedtime daily.   ? CHOLEcalciferoL (vitamin D3) (VITAMIN D-3) 5000 unit tablet Take one tablet by mouth daily.   ? clotrimazole (LOTRIMIN) 1 % topical cream Apply  topically to affected area twice daily. Apply to affect areas of feet for tinea pedis   ? dapagliflozin propanediol (FARXIGA) 10 mg tablet Take one tablet by mouth daily.   ? DERMAREST ECZEMA (HYDROCORT) 1 % topical lotion Apply  topically to affected area twice daily. Apply to face/groin/underarms   ? docusate (COLACE) 100 mg capsule Take one capsule by mouth twice daily.   ? finasteride (PROSCAR) 5 mg tablet Take one tablet by mouth daily.   ? hydrocortisone (CORTIZONE-10) 1 % topical cream Apply  topically to affected area twice daily.  Apply to face/groin/underarms   ? latanoprost (XALATAN) 0.005 % ophthalmic solution Apply one drop to both eyes at bedtime daily.   ? levothyroxine (SYNTHROID) 50 mcg tablet TAKE 1 TABLET BY MOUTH EVERY DAY 30 MINUTES BEFORE BREAKFAST   ? metOLazone (ZAROXOLYN) 2.5 mg tablet Take two tablets by mouth every 7 days. Take 5 mg once each Thursday  Indications: accumulation of fluid resulting from chronic heart failure   ? metoprolol XL (TOPROL XL) 25 mg extended release tablet Take one-half tablet by mouth daily. HOLD IF SBP < 100   ? potassium chloride SR (KLOR-CON M20) 20 mEq tablet Take three tablets by mouth twice daily. Take with a meal and a full glass of water. Take an additional 40 mEq PO once on Thursdays with metolazone  Indications: prevention of low potassium in the blood   ? tamsulosin (FLOMAX) 0.4 mg capsule Take one capsule by mouth daily after breakfast. Do not crush, chew or open capsules. Take 30 minutes following the same meal each day.   ? thiamine (VITAMIN B-1) 100 mg tablet Take one tablet by mouth daily.   ? triamcinolone acetonide (KENALOG) 0.1 % topical cream Apply  topically to affected area twice daily. Use for the rest of your body besides face/groin/underarms            Total Time Today was 45 minutes in the following activities: Preparing to see the patient, Obtaining and/or reviewing separately obtained history, Performing a medically appropriate examination and/or evaluation, Counseling and educating the patient/family/caregiver, Ordering medications, tests, or procedures, Referring and communication with other health care professionals (when not separately reported) and Documenting clinical information in the electronic or other health record     Sue Lush,  CHFN, APRN, NP-C  Advanced Heart Failure APP  [facility]    Collaborating physician Lyndel Safe, MD

## 2021-12-26 ENCOUNTER — Encounter: Admit: 2021-12-26 | Discharge: 2021-12-26 | Payer: MEDICARE

## 2021-12-26 NOTE — Progress Notes
Amiodarone Monitoring 12/26/21:     [facility] Protocol:  ? Due at baseline and every 6 months:  Labs: ALT, AST, TSH.    ? Due at baseline and every 12 months: K, Mg, Office Visit, ECG(can use V rate for HR), Chest Imaging (CXR, PFT or high resolution CT), BP and an eye exam.     Amiodarone status:  Amiodarone monitoring completed.  Next amiodarone review is due in 180 days.    Most recent test results (every 6 months: AST, ALT, TSH w Free T4 within normal limits. Every 12 months: Potassium, Magnesium)  Lab Results   Component Value Date/Time    AST 19 11/22/2021 04:00 PM    ALT 7 11/22/2021 04:00 PM    TSH 7.99 (H) 11/22/2021 04:00 PM    FREET4R 0.8 11/22/2021 04:00 PM    K 5.0 11/27/2021 11:38 AM    MG 2.7 (H) 11/27/2021 07:30 AM       BP:   BP Readings from Last 1 Encounters:   12/04/21 106/62       ECG:   Most recent results for 12-Lead ECG   [facility] MAIN ECG TRIAGE ONLY    Collection Time: 07/26/21  5:35 PM   Result Value Status    VENTRICULAR RATE 76 Final    P-R INTERVAL 168 Final    QRS DURATION 166 Final    Q-T INTERVAL 482 Final    QTC CALCULATION (BAZETT) 542 Final    P AXIS 124 Final    R AXIS 166 Final    T AXIS 83 Final    Impression    AV dual-paced rhythm  Abnormal ECG  When compared with ECG of 13-Jul-2021 12:38,  Vent. rate has increased BY  16 BPM  Confirmed by Holly Bodily [phone]) on 07/26/2021 7:33:04 PM   Most recent results for 12-Lead ECG   ECG 12-LEAD    Collection Time: 07/13/21 12:38 PM   Result Value Status    VENTRICULAR RATE 60 Final    P-R INTERVAL 168 Final    QRS DURATION 168 Final    Q-T INTERVAL 538 Final    QTC CALCULATION (BAZETT) 538 Final    P AXIS  Final    R AXIS 255 Final    T AXIS 69 Final    Impression    AV dual-paced rhythm  Abnormal ECG  When compared with ECG of 12-Jul-2021 11:08,  No significant change was found  Confirmed by Adonis Huguenin ([id number]) on 07/13/2021 3:14:34 PM       CXR, PFT or high resolution CT: [id number]      Provider notified of abnormal results? Yes    These criteria follow national best practices as defined by [facility] Protocol.

## 2021-12-28 ENCOUNTER — Encounter: Admit: 2021-12-28 | Discharge: 2021-12-28 | Payer: MEDICARE

## 2021-12-28 ENCOUNTER — Ambulatory Visit: Admit: 2021-12-28 | Discharge: 2021-12-29 | Payer: MEDICARE

## 2021-12-28 DIAGNOSIS — R5381 Other malaise: Secondary | ICD-10-CM

## 2021-12-28 DIAGNOSIS — I10 Essential (primary) hypertension: Secondary | ICD-10-CM

## 2021-12-28 DIAGNOSIS — I255 Ischemic cardiomyopathy: Secondary | ICD-10-CM

## 2021-12-28 DIAGNOSIS — Z95810 Presence of automatic (implantable) cardiac defibrillator: Secondary | ICD-10-CM

## 2021-12-28 DIAGNOSIS — I5042 Chronic combined systolic (congestive) and diastolic (congestive) heart failure: Secondary | ICD-10-CM

## 2021-12-28 DIAGNOSIS — T148XXA Other injury of unspecified body region, initial encounter: Secondary | ICD-10-CM

## 2021-12-28 DIAGNOSIS — M1990 Unspecified osteoarthritis, unspecified site: Secondary | ICD-10-CM

## 2021-12-28 DIAGNOSIS — Z8679 Personal history of other diseases of the circulatory system: Secondary | ICD-10-CM

## 2021-12-28 DIAGNOSIS — I509 Heart failure, unspecified: Secondary | ICD-10-CM

## 2021-12-28 DIAGNOSIS — E785 Hyperlipidemia, unspecified: Secondary | ICD-10-CM

## 2021-12-28 DIAGNOSIS — Z955 Presence of coronary angioplasty implant and graft: Secondary | ICD-10-CM

## 2021-12-28 DIAGNOSIS — I2510 Atherosclerotic heart disease of native coronary artery without angina pectoris: Secondary | ICD-10-CM

## 2021-12-28 DIAGNOSIS — N289 Disorder of kidney and ureter, unspecified: Secondary | ICD-10-CM

## 2021-12-28 DIAGNOSIS — N19 Unspecified kidney failure: Secondary | ICD-10-CM

## 2021-12-28 DIAGNOSIS — R55 Syncope and collapse: Secondary | ICD-10-CM

## 2021-12-28 DIAGNOSIS — I219 Acute myocardial infarction, unspecified: Secondary | ICD-10-CM

## 2022-01-02 ENCOUNTER — Encounter: Admit: 2022-01-02 | Discharge: 2022-01-02 | Payer: MEDICARE

## 2022-01-02 NOTE — Progress Notes
Received orders to discontinue the HFMS device. End of use report below.

## 2022-01-04 ENCOUNTER — Encounter: Admit: 2022-01-04 | Discharge: 2022-01-04 | Payer: MEDICARE

## 2022-01-04 DIAGNOSIS — N1832 Stage 3b chronic kidney disease (CKD) (HCC): Secondary | ICD-10-CM

## 2022-01-09 ENCOUNTER — Encounter: Admit: 2022-01-09 | Discharge: 2022-01-09 | Payer: MEDICARE

## 2022-01-09 ENCOUNTER — Ambulatory Visit: Admit: 2022-01-09 | Discharge: 2022-01-09 | Payer: MEDICARE

## 2022-01-09 DIAGNOSIS — I219 Acute myocardial infarction, unspecified: Secondary | ICD-10-CM

## 2022-01-09 DIAGNOSIS — T148XXA Other injury of unspecified body region, initial encounter: Secondary | ICD-10-CM

## 2022-01-09 DIAGNOSIS — Z8679 Personal history of other diseases of the circulatory system: Secondary | ICD-10-CM

## 2022-01-09 DIAGNOSIS — L299 Pruritus, unspecified: Secondary | ICD-10-CM

## 2022-01-09 DIAGNOSIS — N19 Unspecified kidney failure: Secondary | ICD-10-CM

## 2022-01-09 DIAGNOSIS — N289 Disorder of kidney and ureter, unspecified: Secondary | ICD-10-CM

## 2022-01-09 DIAGNOSIS — M1990 Unspecified osteoarthritis, unspecified site: Secondary | ICD-10-CM

## 2022-01-09 DIAGNOSIS — E785 Hyperlipidemia, unspecified: Secondary | ICD-10-CM

## 2022-01-09 DIAGNOSIS — R55 Syncope and collapse: Secondary | ICD-10-CM

## 2022-01-09 DIAGNOSIS — I10 Essential (primary) hypertension: Secondary | ICD-10-CM

## 2022-01-09 DIAGNOSIS — I2510 Atherosclerotic heart disease of native coronary artery without angina pectoris: Secondary | ICD-10-CM

## 2022-01-09 DIAGNOSIS — I509 Heart failure, unspecified: Secondary | ICD-10-CM

## 2022-01-09 DIAGNOSIS — Z95810 Presence of automatic (implantable) cardiac defibrillator: Secondary | ICD-10-CM

## 2022-01-09 DIAGNOSIS — I255 Ischemic cardiomyopathy: Secondary | ICD-10-CM

## 2022-01-09 DIAGNOSIS — Z955 Presence of coronary angioplasty implant and graft: Secondary | ICD-10-CM

## 2022-01-09 DIAGNOSIS — L308 Other specified dermatitis: Secondary | ICD-10-CM

## 2022-01-09 MED ORDER — HYDROCORTISONE 2.5 % TP CREA
TOPICAL | 3 refills | 50.00000 days | Status: AC
Start: 2022-01-09 — End: ?

## 2022-01-09 MED ORDER — DUPIXENT SYRINGE 300 MG/2 ML SC SYRG
300 mg | SUBCUTANEOUS | 5 refills | 28.00000 days | Status: AC
Start: 2022-01-09 — End: ?

## 2022-01-09 MED ORDER — TRIAMCINOLONE ACETONIDE 0.1 % TP CREA
3 refills | Status: AC
Start: 2022-01-09 — End: ?

## 2022-01-09 MED ORDER — DUPIXENT SYRINGE 300 MG/2 ML SC SYRG
SUBCUTANEOUS | 0 refills | 28.00000 days | Status: AC
Start: 2022-01-09 — End: ?

## 2022-01-09 MED ORDER — HYDROXYZINE HCL 25 MG PO TAB
ORAL_TABLET | ORAL | 0 refills | 30.00000 days | Status: AC
Start: 2022-01-09 — End: ?

## 2022-01-09 NOTE — Progress Notes
Date of Service: 01/09/2022    Subjective:             Logan Bright is a 86 y.o. male.    History of Present Illness  Return pt. LV 08/30/21.    # Eczematous dermatitis  - pt was initially seen in consultation in the hospital when he was admitted for heart failure exacerbation. 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 Right upper back, biopsy:   -- Psoriasiform spongiosis with mild mixed infiltrate     B. Left forearm, biopsy:   -- Psoriasiform spongiosis with mild mixed infiltrate     Comment: The biopsies are negative for convincing signs of lymphocytic   immunophenotypic aberrancy. Disproportionate epidermotropism is not   identified. Both biopsies will be forwarded for TCR testing to assess   clonality.     Interval Hx:  - overall improved on the trunk  - legs are still significantly flared and itchy  - pt lives in a nursing facility, recently was placed on hospice  - nursing facility is responsible for applying topicals. Pt reports that topicals are not always applied consistently  - no current moisturizer  - significant itching especially on the legs. This interferes with sleep, significant impact on quality of life       PMH: No history of skin cancer  FMH: No history of skin cancer in family  SH: lives in care home     Review of Systems   Constitutional:  Negative for appetite change and unexpected weight change.   Gastrointestinal:  Negative for diarrhea, nausea and vomiting.       Objective:          acetaminophen (TYLENOL EXTRA STRENGTH) 500 mg tablet Take one tablet by mouth every 6 hours as needed. Max of 4,000 mg of acetaminophen in 24 hours.    allopurinoL (ZYLOPRIM) 300 mg tablet Take one tablet by mouth daily. Take with food.    amiodarone (CORDARONE) 200 mg tablet Take one tablet by mouth daily. Take with food.    BIOFREEZE (MENTHOL) 4 % gel     bumetanide (BUMEX) 2 mg tablet Take 2.5 tablets by mouth twice daily.    calcium carbonate (TUMS E-X) 300 mg (750 mg) chewable tablet Chew two tablets by mouth at bedtime daily.    CHOLEcalciferoL (vitamin D3) (VITAMIN D-3) 5000 unit tablet Take one tablet by mouth daily.    clotrimazole (LOTRIMIN) 1 % topical cream Apply  topically to affected area twice daily. Apply to affect areas of feet for tinea pedis    dapagliflozin propanediol (FARXIGA) 10 mg tablet Take one tablet by mouth daily.    DERMAREST ECZEMA (HYDROCORT) 1 % topical lotion Apply  topically to affected area twice daily. Apply to face/groin/underarms    docusate (COLACE) 100 mg capsule Take one capsule by mouth twice daily.    doxycycline hyclate (VIBRACIN) 100 mg tablet Take one tablet by mouth twice daily.    finasteride (PROSCAR) 5 mg tablet Take one tablet by mouth daily.    hydrocortisone (CORTIZONE-10) 1 % topical cream Apply  topically to affected area twice daily. Apply to face/groin/underarms    latanoprost (XALATAN) 0.005 % ophthalmic solution Apply one drop to both eyes at bedtime daily.    levothyroxine (SYNTHROID) 50 mcg tablet TAKE 1 TABLET BY MOUTH EVERY DAY 30 MINUTES BEFORE BREAKFAST    metOLazone (  ZAROXOLYN) 2.5 mg tablet Take two tablets by mouth every 7 days. Take 5 mg once each Thursday  Indications: accumulation of fluid resulting from chronic heart failure    metoprolol succinate XL (TOPROL XL) 25 mg extended release tablet Take one-half tablet by mouth daily.    potassium chloride SR (KLOR-CON M20) 20 mEq tablet Take three tablets by mouth twice daily. Take with a meal and a full glass of water. Take an additional 40 mEq PO once on Thursdays with metolazone  Indications: prevention of low potassium in the blood    tamsulosin (FLOMAX) 0.4 mg capsule Take one capsule by mouth daily after breakfast. Do not crush, chew or open capsules. Take 30 minutes following the same meal each day.    thiamine (VITAMIN B-1) 100 mg tablet Take one tablet by mouth daily. triamcinolone acetonide (KENALOG) 0.1 % topical cream Apply  topically to affected area twice daily. Use for the rest of your body besides face/groin/underarms     Vitals:    01/09/22 1244   PainSc: Zero   Weight: 93.4 kg (206 lb)   Height: 182.9 cm (6')     Body mass index is 27.94 kg/m?[name]     Physical Exam    Areas Examined (all normal unless noted below):  Head/Face  Neck  Chest/breasts/axillae  Back  Abdomen  R upper ext  L upper ext  R lower ext  L lower ext    Pertinent findings include:  Multiple brown and tan evenly pigmented macules are distributed over the head, neck, trunk, arms and legs.  All have symmetric similar dermascopic findings with primarily globular and reticular patterns.    Soft, pigmented, stuck-on-appearing papules are distributed over the trunk.  All have symmetric pebbled dermoscopic findings.    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 Advised that pt may increase to 25 mg nightly. Counseled on risk of sedation. Family reports that pt is watched closely at his nursing facility.  - plan to start dupixent d/t severe impact on patient's quality of life  - Reviewed benefits and risks of dupilimab including injection site reaction, conjunctivitis, eosinophilia, hypersensitivity reaction, keratitis, increased risk of HSV infection, serum sickness, serum sickness-like reaction and urticaria. After discussion, patient indicates complete understanding and wishes to proceed with therapy.  - Plan to start Rx dupilumab 600 mg subcutaneous x 1 followed by 300 mg Q2weeks  - Instructed to avoid live vaccines while on this medication  - Dr. Philip Aspen will begin the prior auth process today    # Melanocytic nevi  - Will cont to monitor  - RTC for new/changing lesions  - counseled on ABCD's of melanoma  - counseled on sunscreen SPF 30 or greater     # Seborrheic keratoses  - discussed benign nature  - reassurance    RTC 4 months

## 2022-01-09 NOTE — Progress Notes
ATTESTATION    I personally performed the key portions of the E/M visit, discussed case with resident and concur with resident documentation of history, physical exam, assessment, and treatment plan unless otherwise noted.    Staff name:  Kyren Vaux M Bernardo Brayman, MD Date:  01/09/2022

## 2022-01-09 NOTE — Patient Instructions
Start plain vaseline at least once to twice daily to the trunk and extremities    Continue triamcinolone 0.1% ointment twice daily to areas of flaring rash on the trunk and extremities    Continue hydrocortisone 2.5% cream twice daily as needed to areas of flaring rash on the face, underarms, groin    Start hydroxyzine 1/2 tablet at night for itching. If patient tolerates well without excessive sedation, you may increase to 25 mg nightly    Will plan to start [place] shots pending insurance authorization

## 2022-01-09 NOTE — Progress Notes
Specialty Medication Education and Injection Education    The patient's caregiver (daughter) participated on the patient's behalf. All references to the patient herein were completed with the caregiver on the patient's behalf.    Warden Fillers was contacted to provide medication education on their new specialty medication.    Warden Fillers was provided with education on their specialty medication(s) DUPIXENT SYRINGE 300 MG/2 ML SC SYRG. Discussion with the patient included: the medication name (brand and generic), medication class, dosing, frequency, duration, route, proper administration, monitoring, common side effects, contraindications, safety precautions, and food/drug interactions to be aware of. The indication, expectations and possible outcomes from treatment were also discussed.     Appropriate storage, safe handling, and disposal directions were reviewed. The patient was educated on timely administration of therapy and management of missed doses. Adherence with therapy and the patient's ability to be adherent with drug therapies were discussed and the patient was provided options for tools/resources that promote adherence to therapy as needed. The patient's ability to self-administer the medication was assessed. Requirements of the REMS program were discussed with the patient as applicable. Recommended vaccinations were reviewed and discussed with the patient as applicable. The patient was instructed to seek medical attention immediately if they experience signs of an allergic reaction, including but not limited to: a rash; hives; itching; red, swollen, blistered, or peeling skin with or without fever.     Medication history and reconciliation were performed (including prescription medications, supplements, over the counter, and herbal products). The medication list was updated and the patient's current medication list is listed below.     Home Medications    Medication Sig   acetaminophen (TYLENOL EXTRA STRENGTH) 500 mg tablet Take one tablet by mouth every 6 hours as needed. Max of 4,000 mg of acetaminophen in 24 hours.   allopurinoL (ZYLOPRIM) 300 mg tablet Take one tablet by mouth daily. Take with food.   amiodarone (CORDARONE) 200 mg tablet Take one tablet by mouth daily. Take with food.   BIOFREEZE (MENTHOL) 4 % gel    bumetanide (BUMEX) 2 mg tablet Take 2.5 tablets by mouth twice daily.   calcium carbonate (TUMS E-X) 300 mg (750 mg) chewable tablet Chew two tablets by mouth at bedtime daily.   CHOLEcalciferoL (vitamin D3) (VITAMIN D-3) 5000 unit tablet Take one tablet by mouth daily.   clotrimazole (LOTRIMIN) 1 % topical cream Apply  topically to affected area twice daily. Apply to affect areas of feet for tinea pedis   dapagliflozin propanediol (FARXIGA) 10 mg tablet Take one tablet by mouth daily.   DERMAREST ECZEMA (HYDROCORT) 1 % topical lotion Apply  topically to affected area twice daily. Apply to face/groin/underarms   docusate (COLACE) 100 mg capsule Take one capsule by mouth twice daily.   doxycycline hyclate (VIBRACIN) 100 mg tablet Take one tablet by mouth twice daily.   finasteride (PROSCAR) 5 mg tablet Take one tablet by mouth daily.   hydrocortisone (CORTIZONE-10) 1 % topical cream Apply  topically to affected area twice daily. Apply to face/groin/underarms   hydrocortisone (HYTONE) 2.5 % topical cream Apply twice daily as needed for eczematous rash on the face and skin folds   hydrOXYzine HCL (ATARAX) 25 mg tablet Take 1/2 tablet by mouth at night for itching. If tolerating well, may increase to 1 tablet every night.   latanoprost (XALATAN) 0.005 % ophthalmic solution Apply one drop to both eyes at bedtime daily.   levothyroxine (SYNTHROID) 50 mcg tablet TAKE 1 TABLET BY  MOUTH EVERY DAY 30 MINUTES BEFORE BREAKFAST   metOLazone (ZAROXOLYN) 2.5 mg tablet Take two tablets by mouth every 7 days. Take 5 mg once each Thursday  Indications: accumulation of fluid resulting from chronic heart failure   metoprolol succinate XL (TOPROL XL) 25 mg extended release tablet Take one-half tablet by mouth daily.   potassium chloride SR (KLOR-CON M20) 20 mEq tablet Take three tablets by mouth twice daily. Take with a meal and a full glass of water. Take an additional 40 mEq PO once on Thursdays with metolazone  Indications: prevention of low potassium in the blood   tamsulosin (FLOMAX) 0.4 mg capsule Take one capsule by mouth daily after breakfast. Do not crush, chew or open capsules. Take 30 minutes following the same meal each day.   thiamine (VITAMIN B-1) 100 mg tablet Take one tablet by mouth daily.   triamcinolone acetonide (KENALOG) 0.1 % topical cream Apply to areas of eczematous rash twice daily for flaring      Drug-drug and drug-food interactions with the new therapy were assessed and reviewed with the patient. The patient was instructed to speak with their health care provider before starting any new drug, including prescription or over the counter, natural / herbal products, or vitamins.    Pregnancy Status: Male, education not applicable.    Appropriate recommended vaccinations were reviewed and discussed with the patient.    Medication Education  The patient was counseled in person. 10 minutes were spent educating the patient.    Patient was educated on proper administration technique as well as storage/disposal. The patient demonstrated correct technique after instruction.    Provided education on dupilumab (Dupixent), including:  -purpose, expected benefit  -dosing   -SC: 600 mg once; then 300 mg every 2 weeks thereafter (RN in facility will be administering per daughter)  -side effects   -most common: injection-site reactions    -rare: eye itching & dryness, infections (HSV), eosinophilia, arthralgias  -infection prevention   -staying up to date with inactivated vaccinations, avoid live vaccinations  -monitoring    -none    Barrie Wale was given the opportunity to ask questions but did not have any questions at the time. Patient was reminded of the refill process and encouraged to call with questions. The monitoring and follow-up plan was discussed with the patient. The patient was instructed to contact their health care provider if their symptoms or health problems do not get better or if they become worse. The patient should contact the specialty pharmacy at [phone] if they have any questions or concerns regarding their medication therapy. The patient verbalized acceptance and understanding.    Follow-up Plan    The filling pharmacy is unknown at this time and will be determined at a later date.    Sharlee Blew, PHARMD

## 2022-01-10 ENCOUNTER — Encounter: Admit: 2022-01-10 | Discharge: 2022-01-10 | Payer: MEDICARE

## 2022-01-10 NOTE — Progress Notes
Contacted Logan Bright to discuss drug coverage or applying for manufacturer assistance regarding their medication: [place].  Left voicemail asking patient to return call to the specialty pharmacy [phone]).  Will call patient again in 1 business days if no call back received.    [name], Specialty Pharmacy  [phone]

## 2022-01-14 ENCOUNTER — Encounter: Admit: 2022-01-14 | Discharge: 2022-01-14 | Payer: MEDICARE

## 2022-01-15 ENCOUNTER — Encounter: Admit: 2022-01-15 | Discharge: 2022-01-15 | Payer: MEDICARE

## 2022-01-16 ENCOUNTER — Encounter: Admit: 2022-01-16 | Discharge: 2022-01-16 | Payer: MEDICARE

## 2022-01-16 NOTE — Progress Notes
Pharmacy Benefits Investigation    Medication name: DUPIXENT SYRINGE [facility]    The specialty pharmacy will pursue medication assistance through the manufacturer [employer] [phone]. This is an initial application.    The medication assistance application was submitted to the manufacturer. A decision typically takes up to 30 business days. The specialty pharmacy team will follow up with the manufacturer to verify they received the application within 5 business days. Please contact the specialty pharmacy with any questions regarding next steps.    [place] Patient Advocate

## 2022-01-25 ENCOUNTER — Encounter: Admit: 2022-01-25 | Discharge: 2022-01-25 | Payer: MEDICARE

## 2022-01-25 NOTE — Progress Notes
Pharmacy Benefits Investigation    Medication name: DUPIXENT PEN 300 MG/2 ML SC PNIJ    The specialty pharmacy will pursue medication assistance through the manufacturer [place] This is an initial application.    Logan Bright was approved for medication assistance through 01/25/2023. The patient will receive the medication at no cost directly from the manufacturer during the approval period. The patient was notified of the approval and manufacturer contact information for obtaining the medication. The clinic pharmacist was notified of the approval and will contact the patient to complete education as needed.        [place] Patient Advocate

## 2022-01-29 ENCOUNTER — Encounter: Admit: 2022-01-29 | Discharge: 2022-01-29 | Payer: MEDICARE

## 2022-01-29 MED ORDER — DUPIXENT SYRINGE 300 MG/2 ML SC SYRG
300 mg | SUBCUTANEOUS | 5 refills | 28.00000 days | Status: AC
Start: 2022-01-29 — End: ?

## 2022-01-29 NOTE — Progress Notes
Pharmacy Medication Initial Assessment    Indication/Regimen  The regimen of DUPIXENT PEN 300 MG/2 ML SC PNIJ indefinitely is appropriate for Logan Bright who has Atopic dermatitis.    Renal dose adjustments are not required. Hepatic dose adjustments are not required. Dose titration is required.    The following dose titration will be used for Dupixent: 600 mg as initial dose, followed by 300 mg once every other week.    The patient has a caregiver who can administer the medication(s).    Baseline Characteristics  Previous medications for this indication: none  Current medications for this indication: hydrocortisone cream, triamcinolone cream, dupixent    Therapeutic Goals and Monitoring  The goal of therapy is to decrease or stabilize symptoms and minimize and avoid steroid use.  BSA involvement: 30%  Pattern and location of skin changes: Erythematous scaling plaques with shallow fissures on the b/l legs; Thin eczematous erythematous plaques and patches on the back    Past Medical History and Comorbidities  Patient Active Problem List   Diagnosis    Ischemic cardiomyopathy    Neurologic cardiac syncope    HTN (hypertension)    Dyslipidemia    Gout    Automatic implantable cardioverter-defibrillator in situ    AKI (acute kidney injury) (HCC)    Recurrent ventricular tachycardia w/ 2 VT ablations 04/2013    Debility    Closed trimalleolar fracture of right ankle    Cryptogenic stroke (HCC)    Atrial fibrillation, permanent (HCC)    Hyponatremia    Hypothyroidism    History of polycythemia    Heart failure (HCC)    Heart failure exacerbated by sotalol (HCC)    Shortness of breath    Cognitive dysfunction    Chronic combined systolic and diastolic CHF, NYHA class 3 (HCC)    Cognitive impairment    Cellulitis of scrotum    Stage 3b chronic kidney disease (CKD) (HCC)    Diverticulitis    Coronary artery disease involving native coronary artery of native heart without angina pectoris    Colitis    Hematuria    Acute kidney injury superimposed on CKD     Hypovitaminosis D    Fluid overload    Acute on chronic HFrEF (heart failure with reduced ejection fraction) (HCC)     Additional comorbidities: yes  Atopic Dermatitis    Labs and Diagnostic Tests  N/A    Allergies  Allergies   Allergen Reactions    Chlorhexidine Gluconate RASH    Colchicine NAUSEA AND VOMITING        Immunizations  Vaccine history was reviewed with the patient. Education was provided on the importance of completing vaccines.    Immunization History   Administered Date(s) Administered    COVID-19 (PFIZER tris-sucrose) mRNA vacc, 30 mcg/0.3 mL (PF) 07/03/2020    COVID-19 (PFIZER), mRNA vacc, 30 mcg/0.3 mL (PF) 08/12/2019, 09/02/2019    Tdap Vaccine 11/11/2015       Medication Reconciliation  Medication reconciliation is based on the patient's most recent medication list in the electronic medical record (EMR) including herbal products and OTC medications. The patient's medication list will be updated during patient education, after speaking with the patient and prior to dispensing the medication.    Drug Interactions    Drug-Drug Interactions  Drug-drug interactions were evaluated. There were not clinically significant drug-drug interactions.     Drug-Food Interactions  Drug-food interactions were not evaluated (NA - not oral).    Home Medications  Medication Sig   acetaminophen (TYLENOL EXTRA STRENGTH) 500 mg tablet Take one tablet by mouth every 6 hours as needed. Max of 4,000 mg of acetaminophen in 24 hours.   allopurinoL (ZYLOPRIM) 300 mg tablet Take one tablet by mouth daily. Take with food.   amiodarone (CORDARONE) 200 mg tablet Take one tablet by mouth daily. Take with food.   BIOFREEZE (MENTHOL) 4 % gel    bumetanide (BUMEX) 2 mg tablet Take 2.5 tablets by mouth twice daily.   calcium carbonate (TUMS E-X) 300 mg (750 mg) chewable tablet Chew two tablets by mouth at bedtime daily.   CHOLEcalciferoL (vitamin D3) (VITAMIN D-3) 5000 unit tablet Take one tablet by mouth daily.   clotrimazole (LOTRIMIN) 1 % topical cream Apply  topically to affected area twice daily. Apply to affect areas of feet for tinea pedis   dapagliflozin propanediol (FARXIGA) 10 mg tablet Take one tablet by mouth daily.   DERMAREST ECZEMA (HYDROCORT) 1 % topical lotion Apply  topically to affected area twice daily. Apply to face/groin/underarms   docusate (COLACE) 100 mg capsule Take one capsule by mouth twice daily.   doxycycline hyclate (VIBRACIN) 100 mg tablet Take one tablet by mouth twice daily.   dupilumab (DUPIXENT SYRINGE) 300 mg/2 mL injectable SYRINGE Inject 600 mg (2 syringes in different injection sites) under the skin on day 1, then start maintenance dose of 300 mg (1 syringe) every other week thereafter starting on day 15  Indications: a type of allergy that causes red and itchy skin called atopic dermatitis   dupilumab (DUPIXENT SYRINGE) 300 mg/2 mL injectable SYRINGE Inject 2 mL under the skin every 14 days. Starting on day 15  Indications: a type of allergy that causes red and itchy skin called atopic dermatitis   finasteride (PROSCAR) 5 mg tablet Take one tablet by mouth daily.   hydrocortisone (CORTIZONE-10) 1 % topical cream Apply  topically to affected area twice daily. Apply to face/groin/underarms   hydrocortisone (HYTONE) 2.5 % topical cream Apply twice daily as needed for eczematous rash on the face and skin folds   hydrOXYzine HCL (ATARAX) 25 mg tablet Take 1/2 tablet by mouth at night for itching. If tolerating well, may increase to 1 tablet every night.   latanoprost (XALATAN) 0.005 % ophthalmic solution Apply one drop to both eyes at bedtime daily.   levothyroxine (SYNTHROID) 50 mcg tablet TAKE 1 TABLET BY MOUTH EVERY DAY 30 MINUTES BEFORE BREAKFAST   metOLazone (ZAROXOLYN) 2.5 mg tablet Take two tablets by mouth every 7 days. Take 5 mg once each Thursday  Indications: accumulation of fluid resulting from chronic heart failure   metoprolol succinate XL (TOPROL XL) 25 mg extended release tablet Take one-half tablet by mouth daily.   potassium chloride SR (KLOR-CON M20) 20 mEq tablet Take three tablets by mouth twice daily. Take with a meal and a full glass of water. Take an additional 40 mEq PO once on Thursdays with metolazone  Indications: prevention of low potassium in the blood   tamsulosin (FLOMAX) 0.4 mg capsule Take one capsule by mouth daily after breakfast. Do not crush, chew or open capsules. Take 30 minutes following the same meal each day.   thiamine (VITAMIN B-1) 100 mg tablet Take one tablet by mouth daily.   triamcinolone acetonide (KENALOG) 0.1 % topical cream Apply to areas of eczematous rash twice daily for flaring     Adverse Drug Reactions  Patient will be educated on common side effects.    Adherence  Patient will be educated on the importance of adherence.    Safety Precautions    Risk Evaluation and Mitigation (REMS) Assessment: REMS is not required for this medication.    Safety precautions were addressed and discussed with the patient as applicable.    Contraindications: Logan Bright does not have contraindications to this medication.      Pregnancy Status: Male, education not applicable.    Medication Education  Counseling was not completed because patient was previously educated and did not require additional counseling. Patient was educated in clinic on 01/09/22.    Logan Bright was given the opportunity to ask questions but did not have any questions at the time. Patient was reminded of the refill process and encouraged to call with questions. The monitoring and follow-up plan was discussed with the patient. The patient was instructed to contact their health care provider if their symptoms or health problems do not get better or if they become worse. The patient should contact the specialty pharmacy at [phone] if they have any questions or concerns regarding their medication therapy. The patient verbalized acceptance and understanding.    Follow-up Plan  The patient will be reassessed within 3 months of starting the medication.    Discussed option to fill at a [facility]. The medication(s) will be received from an outside pharmacy [facility])) based on medication assistance program.    Logan Bright, [facility]

## 2022-02-07 ENCOUNTER — Encounter: Admit: 2022-02-07 | Discharge: 2022-02-07 | Payer: MEDICARE

## 2022-02-22 ENCOUNTER — Encounter: Admit: 2022-02-22 | Discharge: 2022-02-22 | Payer: MEDICARE

## 2022-02-22 NOTE — Telephone Encounter
Received notification via my chart from pt's daughter; responded to my chart and routed to Dr Regenia Skeeter and Sandy Salaam, NP for update. Cancelled upcoming appt order requests. See separate encounter for Post Mortem documentation.

## 2022-02-22 NOTE — Progress Notes
POST-MORTEM DOCUMENTATION    Name: Logan Bright "Logan Bright   MRN: [id number]     DOB: Oct 22, 1935      Age: 87 y.o.  Admission Date: (Not on file)     LOS: 0 days     Date of Service: 02-27-2022      Date of death if known:  Feb 27, 2022  Location of death, if known:Other  How were you notified?   My Chart Message  Who notified us of death? Daughter    Was hospice involved? Yes  Name of hospice involved, if known: Unknown  Date of hospice admission, if known: Unknown    Other information:     From 12/28/21 OV note:     the prognosis is poor.  We recommend patient establish care with hospice team.  Daughter mentioned that they have already spoken to hospice team from the skilled nursing facility where they are staying and would like to pursue hospice management from them. All the questions were addressed.

## 2022-02-22 NOTE — Telephone Encounter
-----  Message from Haywood Lasso, RN sent at 02/10/2022 11:26 AM CST -----  Regarding: FW: Patient [place] '[place]: [phone]    ----- Message -----  From: Perry Mount"  Sent: 02/16/2022  10:59 AM CST  To: [facility] [place]  Subject: Patient Jenny Reichmann Barnabas ListerPearline Cables                         I wanted to let you know my dad, Barnabas Lister passed away today at 8:40am. Thank you for all your care that you gave him.

## 2022-02-28 DEATH — deceased

## 2022-03-14 ENCOUNTER — Encounter: Admit: 2022-03-14 | Discharge: 2022-03-14 | Payer: MEDICARE

## 2022-03-14 NOTE — Progress Notes
Patient passed away 02/22/22 per daughter. Pt identity was notified. MDT and MURJ have been updated. All future cardiac orders have been canceled. BC

## 2022-03-18 ENCOUNTER — Encounter: Admit: 2022-03-18 | Discharge: 2022-03-18 | Payer: MEDICARE

## 2022-04-03 ENCOUNTER — Encounter: Admit: 2022-04-03 | Discharge: 2022-04-03 | Payer: MEDICARE

## 2022-09-18 ENCOUNTER — Encounter: Admit: 2022-09-18 | Discharge: 2022-09-18 | Payer: MEDICARE

## 2022-12-14 ENCOUNTER — Encounter: Admit: 2022-12-14 | Discharge: 2022-12-14 | Payer: MEDICARE

## 2023-03-06 ENCOUNTER — Encounter: Admit: 2023-03-06 | Discharge: 2023-03-06 | Payer: MEDICARE
